# Patient Record
Sex: Female | Born: 1935
Health system: Southern US, Community
[De-identification: ages and names within clinical notes are randomized; demographics above are authoritative.]

## PROBLEM LIST (undated history)

## (undated) DIAGNOSIS — I2510 Atherosclerotic heart disease of native coronary artery without angina pectoris: Secondary | ICD-10-CM

## (undated) DIAGNOSIS — N8110 Cystocele, unspecified: Secondary | ICD-10-CM

## (undated) DIAGNOSIS — C4361 Malignant melanoma of right upper limb, including shoulder: Secondary | ICD-10-CM

## (undated) DIAGNOSIS — F419 Anxiety disorder, unspecified: Secondary | ICD-10-CM

## (undated) DIAGNOSIS — R05 Cough: Secondary | ICD-10-CM

## (undated) DIAGNOSIS — I2699 Other pulmonary embolism without acute cor pulmonale: Secondary | ICD-10-CM

## (undated) DIAGNOSIS — E669 Obesity, unspecified: Secondary | ICD-10-CM

## (undated) DIAGNOSIS — I442 Atrioventricular block, complete: Secondary | ICD-10-CM

## (undated) DIAGNOSIS — Z86711 Personal history of pulmonary embolism: Secondary | ICD-10-CM

## (undated) DIAGNOSIS — Z950 Presence of cardiac pacemaker: Secondary | ICD-10-CM

## (undated) DIAGNOSIS — I447 Left bundle-branch block, unspecified: Secondary | ICD-10-CM

## (undated) DIAGNOSIS — E559 Vitamin D deficiency, unspecified: Secondary | ICD-10-CM

## (undated) DIAGNOSIS — Z8619 Personal history of other infectious and parasitic diseases: Secondary | ICD-10-CM

## (undated) DIAGNOSIS — I10 Essential (primary) hypertension: Secondary | ICD-10-CM

## (undated) DIAGNOSIS — H8110 Benign paroxysmal vertigo, unspecified ear: Secondary | ICD-10-CM

## (undated) DIAGNOSIS — K5790 Diverticulosis of intestine, part unspecified, without perforation or abscess without bleeding: Secondary | ICD-10-CM

## (undated) DIAGNOSIS — E119 Type 2 diabetes mellitus without complications: Secondary | ICD-10-CM

## (undated) DIAGNOSIS — R059 Cough, unspecified: Secondary | ICD-10-CM

## (undated) DIAGNOSIS — R112 Nausea with vomiting, unspecified: Secondary | ICD-10-CM

## (undated) DIAGNOSIS — C801 Malignant (primary) neoplasm, unspecified: Secondary | ICD-10-CM

## (undated) DIAGNOSIS — Z9889 Other specified postprocedural states: Secondary | ICD-10-CM

## (undated) HISTORY — PX: TRANSTHORACIC ECHOCARDIOGRAM: SHX275

## (undated) HISTORY — DX: Malignant melanoma of right upper limb, including shoulder: C43.61

## (undated) HISTORY — DX: Benign paroxysmal vertigo, unspecified ear: H81.10

## (undated) HISTORY — PX: BREAST SURGERY: SHX581

## (undated) HISTORY — DX: Anxiety disorder, unspecified: F41.9

## (undated) HISTORY — DX: Other pulmonary embolism without acute cor pulmonale: I26.99

## (undated) HISTORY — DX: Vitamin D deficiency, unspecified: E55.9

## (undated) HISTORY — PX: CATARACT EXTRACTION W/ INTRAOCULAR LENS  IMPLANT, BILATERAL: SHX1307

## (undated) HISTORY — PX: CARDIOVASCULAR STRESS TEST: SHX262

## (undated) HISTORY — DX: Type 2 diabetes mellitus without complications: E11.9

## (undated) HISTORY — DX: Obesity, unspecified: E66.9

---

## 1965-11-21 HISTORY — PX: CHOLECYSTECTOMY: SHX55

## 1969-07-22 DIAGNOSIS — Z86711 Personal history of pulmonary embolism: Secondary | ICD-10-CM

## 1969-07-22 HISTORY — DX: Personal history of pulmonary embolism: Z86.711

## 1972-11-21 HISTORY — PX: VAGINAL HYSTERECTOMY: SUR661

## 1978-11-21 DIAGNOSIS — I2699 Other pulmonary embolism without acute cor pulmonale: Secondary | ICD-10-CM

## 1978-11-21 HISTORY — DX: Other pulmonary embolism without acute cor pulmonale: I26.99

## 2000-01-13 ENCOUNTER — Emergency Department (HOSPITAL_COMMUNITY): Admission: EM | Admit: 2000-01-13 | Discharge: 2000-01-13 | Payer: Self-pay | Admitting: Emergency Medicine

## 2000-04-24 ENCOUNTER — Other Ambulatory Visit: Admission: RE | Admit: 2000-04-24 | Discharge: 2000-04-24 | Payer: Self-pay | Admitting: Geriatric Medicine

## 2000-08-28 ENCOUNTER — Ambulatory Visit (HOSPITAL_COMMUNITY): Admission: RE | Admit: 2000-08-28 | Discharge: 2000-08-28 | Payer: Self-pay | Admitting: Geriatric Medicine

## 2000-08-28 ENCOUNTER — Encounter: Payer: Self-pay | Admitting: Geriatric Medicine

## 2001-03-21 ENCOUNTER — Encounter: Payer: Self-pay | Admitting: Geriatric Medicine

## 2001-03-21 ENCOUNTER — Encounter: Admission: RE | Admit: 2001-03-21 | Discharge: 2001-03-21 | Payer: Self-pay | Admitting: Geriatric Medicine

## 2003-07-01 ENCOUNTER — Encounter: Admission: RE | Admit: 2003-07-01 | Discharge: 2003-07-01 | Payer: Self-pay | Admitting: Geriatric Medicine

## 2003-07-01 ENCOUNTER — Encounter: Payer: Self-pay | Admitting: Geriatric Medicine

## 2004-08-12 ENCOUNTER — Encounter: Admission: RE | Admit: 2004-08-12 | Discharge: 2004-08-12 | Payer: Self-pay | Admitting: Geriatric Medicine

## 2004-10-11 ENCOUNTER — Ambulatory Visit (HOSPITAL_COMMUNITY): Admission: RE | Admit: 2004-10-11 | Discharge: 2004-10-11 | Payer: Self-pay | Admitting: Gastroenterology

## 2004-10-11 ENCOUNTER — Encounter (INDEPENDENT_AMBULATORY_CARE_PROVIDER_SITE_OTHER): Payer: Self-pay | Admitting: Specialist

## 2005-11-24 ENCOUNTER — Encounter: Admission: RE | Admit: 2005-11-24 | Discharge: 2005-11-24 | Payer: Self-pay | Admitting: Geriatric Medicine

## 2007-01-24 ENCOUNTER — Encounter: Admission: RE | Admit: 2007-01-24 | Discharge: 2007-01-24 | Payer: Self-pay | Admitting: Geriatric Medicine

## 2008-01-29 ENCOUNTER — Encounter: Admission: RE | Admit: 2008-01-29 | Discharge: 2008-01-29 | Payer: Self-pay | Admitting: Geriatric Medicine

## 2008-11-30 ENCOUNTER — Inpatient Hospital Stay (HOSPITAL_COMMUNITY): Admission: EM | Admit: 2008-11-30 | Discharge: 2008-12-01 | Payer: Self-pay | Admitting: Emergency Medicine

## 2008-11-30 ENCOUNTER — Ambulatory Visit: Payer: Self-pay | Admitting: Internal Medicine

## 2009-02-24 ENCOUNTER — Encounter: Admission: RE | Admit: 2009-02-24 | Discharge: 2009-02-24 | Payer: Self-pay | Admitting: Geriatric Medicine

## 2009-03-02 ENCOUNTER — Ambulatory Visit (HOSPITAL_COMMUNITY): Admission: RE | Admit: 2009-03-02 | Discharge: 2009-03-02 | Payer: Self-pay | Admitting: Gastroenterology

## 2009-11-28 ENCOUNTER — Emergency Department (HOSPITAL_COMMUNITY): Admission: EM | Admit: 2009-11-28 | Discharge: 2009-11-28 | Payer: Self-pay | Admitting: Emergency Medicine

## 2010-04-26 ENCOUNTER — Encounter: Admission: RE | Admit: 2010-04-26 | Discharge: 2010-04-26 | Payer: Self-pay | Admitting: Geriatric Medicine

## 2011-02-06 LAB — DIFFERENTIAL
Basophils Absolute: 0 10*3/uL (ref 0.0–0.1)
Basophils Relative: 1 % (ref 0–1)
Eosinophils Relative: 3 % (ref 0–5)
Neutro Abs: 3.6 10*3/uL (ref 1.7–7.7)

## 2011-02-06 LAB — BASIC METABOLIC PANEL
BUN: 20 mg/dL (ref 6–23)
CO2: 26 mEq/L (ref 19–32)
Chloride: 107 mEq/L (ref 96–112)
GFR calc Af Amer: >60 mL/min (ref >60)
GFR calc non Af Amer: >60 mL/min (ref >60)
Glucose, Bld: 164 mg/dL — ABNORMAL HIGH (ref 70–99)
Potassium: 4.1 mEq/L (ref 3.5–5.1)

## 2011-02-06 LAB — CBC
Hemoglobin: 13.1 g/dL (ref 12.0–15.0)
WBC: 5.8 10*3/uL (ref 4.0–10.5)

## 2011-03-07 LAB — DIFFERENTIAL
Basophils Absolute: 0 10*3/uL (ref 0.0–0.1)
Basophils Absolute: 0.1 10*3/uL (ref 0.0–0.1)
Eosinophils Absolute: 0 10*3/uL (ref 0.0–0.7)
Eosinophils Absolute: 0 10*3/uL (ref 0.0–0.7)
Eosinophils Absolute: 0.3 10*3/uL (ref 0.0–0.7)
Eosinophils Relative: 0 % (ref 0–5)
Eosinophils Relative: 3 % (ref 0–5)
Lymphocytes Relative: 12 % (ref 12–46)
Lymphocytes Relative: 13 % (ref 12–46)
Lymphocytes Relative: 20 % (ref 12–46)
Lymphs Abs: 1.4 10*3/uL (ref 0.7–4.0)
Monocytes Absolute: 0.5 10*3/uL (ref 0.1–1.0)
Monocytes Relative: 6 % (ref 3–12)
Neutrophils Relative %: 70 % (ref 43–77)
Neutrophils Relative %: 81 % — ABNORMAL HIGH (ref 43–77)
Neutrophils Relative %: 83 % — ABNORMAL HIGH (ref 43–77)

## 2011-03-07 LAB — COMPREHENSIVE METABOLIC PANEL
ALT: 52 U/L — ABNORMAL HIGH (ref 0–35)
Alkaline Phosphatase: 76 U/L (ref 39–117)
Alkaline Phosphatase: 89 U/L (ref 39–117)
BUN: 20 mg/dL (ref 6–23)
CO2: 28 mEq/L (ref 19–32)
Chloride: 101 mEq/L (ref 96–112)
Creatinine, Ser: 0.94 mg/dL (ref 0.4–1.2)
GFR calc Af Amer: >60 mL/min (ref >60)
GFR calc non Af Amer: 59 mL/min — ABNORMAL LOW (ref >60)
Glucose, Bld: 124 mg/dL — ABNORMAL HIGH (ref 70–99)
Potassium: 4.8 mEq/L (ref 3.5–5.1)
Potassium: 5 mEq/L (ref 3.5–5.1)
Sodium: 137 mEq/L (ref 135–145)
Total Protein: 7.4 g/dL (ref 6.0–8.3)

## 2011-03-07 LAB — CBC
HCT: 36.1 % (ref 36.0–46.0)
HCT: 36.2 % (ref 36.0–46.0)
HCT: 43.8 % (ref 36.0–46.0)
MCHC: 34.3 g/dL (ref 30.0–36.0)
MCV: 90.5 fL (ref 78.0–100.0)
Platelets: 252 10*3/uL (ref 150–400)
Platelets: 258 10*3/uL (ref 150–400)
RBC: 3.99 MIL/uL (ref 3.87–5.11)
RBC: 4.83 MIL/uL (ref 3.87–5.11)
RDW: 14.2 % (ref 11.5–15.5)
WBC: 16.5 10*3/uL — ABNORMAL HIGH (ref 4.0–10.5)
WBC: 9 10*3/uL (ref 4.0–10.5)

## 2011-03-07 LAB — URINALYSIS, ROUTINE W REFLEX MICROSCOPIC
Hgb urine dipstick: NEGATIVE
Specific Gravity, Urine: 1.01 (ref 1.005–1.030)
Urobilinogen, UA: 0.2 mg/dL (ref 0.0–1.0)
pH: 6.5 (ref 5.0–8.0)

## 2011-04-05 NOTE — Op Note (Signed)
Melissa Novak, Melissa Novak              ACCOUNT NO.:  [id number]   MEDICAL RECORD NO.:  [id number]          PATIENT TYPE:  AMB   LOCATION:  ENDO                         FACILITY:  [facility]   PHYSICIAN:  Petra Kuba, M.D.    DATE OF BIRTH:  [birth year]   DATE OF PROCEDURE:  DATE OF DISCHARGE:                               OPERATIVE REPORT   PROCEDURE:  Colonoscopy.   INDICATION:  The patient with recent diverticulitis, abnormal CAT scan,  change in bowel habits, history of colon polyps, almost due for colonic  screening.  Consent was signed after risks, benefits, methods, and  options thoroughly discussed multiple times in the past.   MEDICINES USED:  1. Morphine 4 mg.  2. Versed 10 mg.   PROCEDURE:  Rectal inspection is pertinent for external hemorrhoids,  small.  Digital exam was negative.  The pediatric video colonoscope was  inserted and fairly easily with abdominal pressure advanced around the  colon to the cecum.  On insertion, some sigmoid diverticula were seen,  but no other abnormalities.  Cecum was identified by the appendiceal  orifice and the ileocecal valve.  Prep was adequate.  There were some  liquid stools that required washing and suctioning.  The scope was  slowly withdrawn.  The cecum ascending, transverse, and majority of the  descending were all normal.  The few scattered diverticula were  confirmed in the sigmoid possibly in the distal descending, but no  polyps, tumors, masses, or signs of any residual inflammation was seen  as we slowly withdrew back to the rectum.  Once back into the rectum,  anorectal pull-through and retroflexion confirmed some small  hemorrhoids.  Scope was drained, readvanced towards the left side of the  colon.  Air was suctioned, scope removed.  The patient tolerated the  procedure well.  There was no obvious immediate complication.   ENDOSCOPIC DIAGNOSES:  1. Internal and external small hemorrhoids.  2. Few scattered primarily sigmoid  diverticula.  3. Otherwise, within normal limits to the cecum.   PLAN:  Followup p.r.n. or in 2 months to recheck symptoms and make sure  no further workup plans are needed and recheck colon screening in 5  years if doing well medically.           ______________________________  Petra Kuba, M.D.     MEM/MEDQ  D:  03/02/2009  T:  03/03/2009  Job:  [id number]   cc:   Hal T. Stoneking, M.D.

## 2011-04-05 NOTE — H&P (Signed)
Melissa Novak, Melissa Novak              ACCOUNT NO.:  [id number]   MEDICAL RECORD NO.:  [id number]          PATIENT TYPE:  EMS   LOCATION:  ED                           FACILITY:  [facility]   PHYSICIAN:  Donalynn Furlong, MD      DATE OF BIRTH:  1934-08-04   DATE OF ADMISSION:  11/29/2008  DATE OF DISCHARGE:                              HISTORY & PHYSICAL   PRIMARY CARE PHYSICIAN:  Hal T. Stoneking, M.D.   CHIEF COMPLAINTS:  Lower abdominal pain.   HISTORY OF PRESENT ILLNESS:  Melissa Novak is a 77 year old Caucasian  female who lives by herself.  She presented to Urgent Care at [facility]  clinic with lower abdominal pain starting yesterday and she mentioned  that this pain is in the lower abdomen which is dull with occasional  sharpness and over the bladder and lower abdomen, gets worse with  coughing.  She denied any urinary symptoms.  She feels warm but no  fever.  She denied any chills, respiratory symptoms, diarrhea,  constipation, urinary symptoms at this time.  She does have nausea  episodes without any vomiting, and the patient is already on doxycycline  (second round for her pneumonia).  She was started on Z-Pak and steroid  on December 28 and then 2 days ago she started on doxycycline for 5  days.  She mentioned that her pain is around 7/10 at the time of  presentation, now resolved with pain medication in the ER, but she is  still nauseous.  Appetite is reduced.  She does have a history of  diverticulosis but no history of a previous diverticulitis in the past.  She does have a colon polyp.  She mentioned she had a cholecystectomy  and total abdominal hysterectomy and bilateral salpingo-oophorectomy.   PAST MEDICAL HISTORY:  1. Total abdominal hysterectomy bilateral salpingo-oophorectomy.  2. Cholecystectomy.  3. Diverticulosis and colon polyp.  4. Hypertension.   SURGICAL HISTORY:  As above.   ALLERGIES:  KEFLEX CAUSES RASH, SULFA CAUSES RASH AND BLURRED VISION,  TALWIN.  SHE  MENTIONS THAT SHE HAS ALLERGY TO PENICILLIN, BUT SHE HAS  TOLERATED AMOXICILLIN AND AUGMENTIN IN THE PAST WHICH SUGGESTS THAT SHE  MAY NOT HAVE ALLERGY TO PENICILLIN, BUT SHE IS ALLERGIC TO  CEPHALOSPORIN.   FAMILY HISTORY:  Nothing contributory to the current problem.   SOCIAL HISTORY:  She lives by herself.  No recent alcohol, drug, tobacco  use.   REVIEW OF SYSTEMS:  Positive as per HPI, otherwise negative.   REVIEW OF SYSTEMS:  Done for 14 systems.   PHYSICAL EXAMINATION:  VITAL SIGNS:  Blood pressure 139/66, pulse 102,  temperature 97.6, pulse 16.  GENERAL:  Alert, oriented and not in acute distress, lying in bed.  Mild  nausea.  HEENT:  Head:  Normocephalic, nontraumatic.  Eyes:  Pupils equally  reactive to light and accommodation.  Extraocular muscles intact.  Oral  cavity and mucous membranes are moist.  No thrush noted.  NECK:  No thyromegaly, JVD or lymphadenopathy.  CARDIOVASCULAR:  S1, S2 regular.  No murmur.  LUNGS:  Clear to auscultation bilaterally.  No wheezing, no crackles.  ABDOMEN:  Minimal tenderness in the lower abdomen, otherwise, obese and  no distention.  No organomegaly.  Scar of previous surgery noted.  EXTREMITIES:  No clubbing, cyanosis or edema.  Pulses positive in all  four extremities.  SKIN:  No rash or bruits.  NEUROLOGIC:  Exam shows intact cranial nerves, muscular strength,  sensation and reflexes.   HOME MEDICATION:  Lasix, benazepril and amlodipine.   LABORATORY DATA:  CT of the pelvis and abdomen with contrast shows  sigmoid diverticulitis with no drainable abscess.  Urinalysis  unremarkable.  CBC shows WBC of 16.5 with a left shift, and no bands.  Comprehensive metabolic panel normal except glucose 124 and GFR 59.   ASSESSMENT AND PLAN:  1. Acute sigmoid diverticulitis without any abscess.  2. Hypertension controlled.  3. History of hysterectomy.  4. Cholecystectomy.  5. Leukocytosis.  6. Diverticulosis.  7. Colon polyp.   PLAN:   Will admit the patient on medicine bed with a diagnosis of  sigmoid diverticulitis.  The patient wants to be DNR.  DNR code was  discussed with her.  That is her wish.  Will put the order for DNR  today.  She will be n.p.o. except medicines, ice chips and water.  Will  check vitals, input/output every 8 hours.  Will check CBC with  differential, CMP in the morning.  Will start IV normal saline for  hydration.  Will start IV Ciprofloxacin and IV metronidazole for  antibiotic coverage for diverticulitis and given the history of  allergies.  We will avoid other medications.  Will start IV morphine,  Zofran and Phenergan p.r.n. for abdominal pain, nausea and vomiting,  respectively.  Will start SCDs on legs.  Will start IV Nexium for GI  prophylaxis.  Will continue benazepril and amlodipine at home dose for  the control of blood pressure. Further plan according to the workup  pending.  The patient may need further CT imaging at the resolution of  her current episode.      Donalynn Furlong, MD  Electronically Signed     TVP/MEDQ  D:  11/30/2008  T:  11/30/2008  Job:  [id number]   cc:   Hal T. Stoneking, M.D.  Fax: [phone]

## 2011-04-08 NOTE — Op Note (Signed)
Melissa Novak, Melissa Novak              ACCOUNT NO.:  [id number]   MEDICAL RECORD NO.:  [id number]          PATIENT TYPE:  AMB   LOCATION:  ENDO                         FACILITY:  [facility]   PHYSICIAN:  Petra Kuba, M.D.    DATE OF BIRTH:  Aug 06, 1934   DATE OF PROCEDURE:  10/11/2004  DATE OF DISCHARGE:                                 OPERATIVE REPORT   PROCEDURE:  Colonoscopy.   INDICATIONS:  Screening.   Consent was signed after the risks, benefits, methods and options were  thoroughly discussed in the office.   MEDICATIONS:  Morphine 4 mg, Versed 10.5 mg.   DESCRIPTION OF PROCEDURE:  Rectal inspection was pertinent for external  hemorrhoids.  Digital rectal exam was negative.  The video colonoscope was  inserted and advanced to the cecum.  This did require rolling her on her  back and various abdominal pressures.  Upon insertion, some occasional left-  sided diverticula were seen, no other abnormalities.  The cecum was  identified by the appendiceal orifice and the ileocecal valve.  The scope  was slowly withdrawn.  The prep was adequate, there was some liquid stool  removed with washing and suction.  On slow withdrawal through the colon, the  cecum, ascending, transverse, and descending were all normal except for an  occasional left-sided diverticulum mostly in the sigmoid. In the more distal  sigmoid a small polyp was seen, snared, electrocautery applied and it was  suctioned through the scope and collected in the trap.  One other tiny  distal sigmoid polyp was seen and was hot biopsied x1 and put in the same  container.  Anorectal pull through and retroflexion confirmed some small  hemorrhoids.  The scope then readvanced through the left side of the colon,  air was suctioned, and the scope removed.  The patient tolerated the  procedure well and there was no obvious or immediate complication.   ENDOSCOPIC DIAGNOSES:  1.  Internal and external hemorrhoids.  2.  Occasional  left-sided diverticula.  3.  Two tiny to small sigmoid polyps, one hot biopsied, one snared.  4.  Otherwise within normal limits to the cecum.   PLAN:  Await pathology and determine future colonic screening, see her back  p.r.n., otherwise return to the care of Dr. Pete Glatter, for the customary  healthcare maintenance to include yearly rectals and guaiacs.       MEM/MEDQ  D:  10/11/2004  T:  10/11/2004  Job:  [id number]   cc:   Hal T. Stoneking, M.D.  [address] [place], [place] [address]  Fax: [phone]

## 2011-04-26 ENCOUNTER — Other Ambulatory Visit: Payer: Self-pay | Admitting: Geriatric Medicine

## 2011-04-26 DIAGNOSIS — Z1231 Encounter for screening mammogram for malignant neoplasm of breast: Secondary | ICD-10-CM

## 2011-05-12 ENCOUNTER — Ambulatory Visit: Payer: Self-pay

## 2011-05-19 ENCOUNTER — Ambulatory Visit: Payer: Self-pay

## 2011-06-13 ENCOUNTER — Ambulatory Visit: Payer: Self-pay

## 2011-07-07 ENCOUNTER — Ambulatory Visit
Admission: RE | Admit: 2011-07-07 | Discharge: 2011-07-07 | Disposition: A | Discharge status: Home / Self Care | Payer: Medicare Other | Source: Ambulatory Visit | Attending: Geriatric Medicine | Admitting: Geriatric Medicine

## 2011-07-07 DIAGNOSIS — Z1231 Encounter for screening mammogram for malignant neoplasm of breast: Secondary | ICD-10-CM

## 2011-07-27 ENCOUNTER — Other Ambulatory Visit: Payer: Self-pay | Admitting: Dermatology

## 2011-11-28 DIAGNOSIS — N3941 Urge incontinence: Secondary | ICD-10-CM | POA: Diagnosis not present

## 2012-01-11 DIAGNOSIS — N3941 Urge incontinence: Secondary | ICD-10-CM | POA: Diagnosis not present

## 2012-01-17 DIAGNOSIS — N8111 Cystocele, midline: Secondary | ICD-10-CM | POA: Diagnosis not present

## 2012-01-17 DIAGNOSIS — N952 Postmenopausal atrophic vaginitis: Secondary | ICD-10-CM | POA: Diagnosis not present

## 2012-01-17 DIAGNOSIS — N3941 Urge incontinence: Secondary | ICD-10-CM | POA: Diagnosis not present

## 2012-01-19 ENCOUNTER — Other Ambulatory Visit: Payer: Self-pay | Admitting: Urology

## 2012-01-26 DIAGNOSIS — Z961 Presence of intraocular lens: Secondary | ICD-10-CM | POA: Diagnosis not present

## 2012-01-26 DIAGNOSIS — H04229 Epiphora due to insufficient drainage, unspecified lacrimal gland: Secondary | ICD-10-CM | POA: Diagnosis not present

## 2012-01-30 DIAGNOSIS — N952 Postmenopausal atrophic vaginitis: Secondary | ICD-10-CM | POA: Diagnosis not present

## 2012-02-09 DIAGNOSIS — I10 Essential (primary) hypertension: Secondary | ICD-10-CM | POA: Diagnosis not present

## 2012-02-09 DIAGNOSIS — R0789 Other chest pain: Secondary | ICD-10-CM | POA: Diagnosis not present

## 2012-02-09 DIAGNOSIS — I447 Left bundle-branch block, unspecified: Secondary | ICD-10-CM | POA: Diagnosis not present

## 2012-02-09 DIAGNOSIS — R32 Unspecified urinary incontinence: Secondary | ICD-10-CM | POA: Diagnosis not present

## 2012-02-09 DIAGNOSIS — R55 Syncope and collapse: Secondary | ICD-10-CM | POA: Diagnosis not present

## 2012-02-09 DIAGNOSIS — Z79899 Other long term (current) drug therapy: Secondary | ICD-10-CM | POA: Diagnosis not present

## 2012-02-09 DIAGNOSIS — Z01810 Encounter for preprocedural cardiovascular examination: Secondary | ICD-10-CM | POA: Diagnosis not present

## 2012-02-14 ENCOUNTER — Encounter (HOSPITAL_BASED_OUTPATIENT_CLINIC_OR_DEPARTMENT_OTHER): Payer: Self-pay | Admitting: *Deleted

## 2012-02-14 DIAGNOSIS — R32 Unspecified urinary incontinence: Secondary | ICD-10-CM | POA: Diagnosis not present

## 2012-02-14 DIAGNOSIS — Z01810 Encounter for preprocedural cardiovascular examination: Secondary | ICD-10-CM | POA: Diagnosis not present

## 2012-02-14 DIAGNOSIS — R0789 Other chest pain: Secondary | ICD-10-CM | POA: Diagnosis not present

## 2012-02-14 DIAGNOSIS — I447 Left bundle-branch block, unspecified: Secondary | ICD-10-CM | POA: Diagnosis not present

## 2012-02-14 DIAGNOSIS — R55 Syncope and collapse: Secondary | ICD-10-CM | POA: Diagnosis not present

## 2012-02-15 ENCOUNTER — Encounter (HOSPITAL_BASED_OUTPATIENT_CLINIC_OR_DEPARTMENT_OTHER): Payer: Self-pay | Admitting: *Deleted

## 2012-02-15 NOTE — Progress Notes (Signed)
NPO AFTER MN. ARRIVES AT 0700. NEEDS ISTAT. CURRENT EKG , NOTE, STRESS TEST TO BE FAXED FROM DR Caro Hight. WILL TAKE NORVASC AM OF SURG. W/ SIP OF WATER. REVIEWED [facility] GUIDELINES, WILL BRING MED.

## 2012-02-20 ENCOUNTER — Encounter (HOSPITAL_BASED_OUTPATIENT_CLINIC_OR_DEPARTMENT_OTHER): Payer: Self-pay | Admitting: *Deleted

## 2012-02-20 DIAGNOSIS — Z950 Presence of cardiac pacemaker: Secondary | ICD-10-CM

## 2012-02-20 HISTORY — DX: Presence of cardiac pacemaker: Z95.0

## 2012-02-20 HISTORY — PX: INSERT / REPLACE / REMOVE PACEMAKER: SUR710

## 2012-02-22 DIAGNOSIS — N3941 Urge incontinence: Secondary | ICD-10-CM | POA: Diagnosis not present

## 2012-02-23 DIAGNOSIS — R55 Syncope and collapse: Secondary | ICD-10-CM | POA: Diagnosis not present

## 2012-02-23 DIAGNOSIS — Z01810 Encounter for preprocedural cardiovascular examination: Secondary | ICD-10-CM | POA: Diagnosis not present

## 2012-02-23 DIAGNOSIS — I447 Left bundle-branch block, unspecified: Secondary | ICD-10-CM | POA: Diagnosis not present

## 2012-02-23 DIAGNOSIS — R32 Unspecified urinary incontinence: Secondary | ICD-10-CM | POA: Diagnosis not present

## 2012-02-23 DIAGNOSIS — R0789 Other chest pain: Secondary | ICD-10-CM | POA: Diagnosis not present

## 2012-02-24 ENCOUNTER — Ambulatory Visit (HOSPITAL_BASED_OUTPATIENT_CLINIC_OR_DEPARTMENT_OTHER)
Admission: RE | Admit: 2012-02-24 | Discharge: 2012-02-29 | Disposition: A | Discharge status: Home / Self Care | Payer: Medicare Other | Source: Ambulatory Visit | Attending: Urology | Admitting: Urology

## 2012-02-24 ENCOUNTER — Encounter (HOSPITAL_COMMUNITY)
Admission: RE | Disposition: A | Discharge status: Home / Self Care | Payer: Self-pay | Source: Ambulatory Visit | Attending: Urology

## 2012-02-24 ENCOUNTER — Ambulatory Visit (HOSPITAL_BASED_OUTPATIENT_CLINIC_OR_DEPARTMENT_OTHER): Payer: Medicare Other | Admitting: Anesthesiology

## 2012-02-24 ENCOUNTER — Encounter (HOSPITAL_BASED_OUTPATIENT_CLINIC_OR_DEPARTMENT_OTHER): Payer: Self-pay | Admitting: Anesthesiology

## 2012-02-24 ENCOUNTER — Encounter (HOSPITAL_BASED_OUTPATIENT_CLINIC_OR_DEPARTMENT_OTHER): Payer: Self-pay | Admitting: *Deleted

## 2012-02-24 DIAGNOSIS — N8111 Cystocele, midline: Secondary | ICD-10-CM | POA: Diagnosis not present

## 2012-02-24 DIAGNOSIS — I447 Left bundle-branch block, unspecified: Secondary | ICD-10-CM | POA: Insufficient documentation

## 2012-02-24 DIAGNOSIS — N8189 Other female genital prolapse: Secondary | ICD-10-CM | POA: Diagnosis not present

## 2012-02-24 DIAGNOSIS — R001 Bradycardia, unspecified: Secondary | ICD-10-CM | POA: Diagnosis present

## 2012-02-24 DIAGNOSIS — I10 Essential (primary) hypertension: Secondary | ICD-10-CM | POA: Insufficient documentation

## 2012-02-24 DIAGNOSIS — N3941 Urge incontinence: Secondary | ICD-10-CM | POA: Insufficient documentation

## 2012-02-24 DIAGNOSIS — I498 Other specified cardiac arrhythmias: Secondary | ICD-10-CM | POA: Insufficient documentation

## 2012-02-24 DIAGNOSIS — Z950 Presence of cardiac pacemaker: Secondary | ICD-10-CM | POA: Diagnosis present

## 2012-02-24 DIAGNOSIS — Z79899 Other long term (current) drug therapy: Secondary | ICD-10-CM | POA: Diagnosis not present

## 2012-02-24 DIAGNOSIS — I495 Sick sinus syndrome: Secondary | ICD-10-CM | POA: Diagnosis not present

## 2012-02-24 DIAGNOSIS — N816 Rectocele: Secondary | ICD-10-CM | POA: Insufficient documentation

## 2012-02-24 DIAGNOSIS — Z7982 Long term (current) use of aspirin: Secondary | ICD-10-CM | POA: Insufficient documentation

## 2012-02-24 DIAGNOSIS — I459 Conduction disorder, unspecified: Secondary | ICD-10-CM | POA: Diagnosis not present

## 2012-02-24 DIAGNOSIS — R55 Syncope and collapse: Secondary | ICD-10-CM | POA: Diagnosis not present

## 2012-02-24 DIAGNOSIS — Z86711 Personal history of pulmonary embolism: Secondary | ICD-10-CM | POA: Insufficient documentation

## 2012-02-24 DIAGNOSIS — N814 Uterovaginal prolapse, unspecified: Secondary | ICD-10-CM | POA: Diagnosis not present

## 2012-02-24 DIAGNOSIS — R42 Dizziness and giddiness: Secondary | ICD-10-CM | POA: Diagnosis not present

## 2012-02-24 HISTORY — DX: Atherosclerotic heart disease of native coronary artery without angina pectoris: I25.10

## 2012-02-24 HISTORY — DX: Essential (primary) hypertension: I10

## 2012-02-24 HISTORY — DX: Nausea with vomiting, unspecified: R11.2

## 2012-02-24 HISTORY — DX: Personal history of pulmonary embolism: Z86.711

## 2012-02-24 HISTORY — DX: Cystocele, unspecified: N81.10

## 2012-02-24 HISTORY — DX: Diverticulosis of intestine, part unspecified, without perforation or abscess without bleeding: K57.90

## 2012-02-24 HISTORY — PX: PUBOVAGINAL SLING: SHX1035

## 2012-02-24 HISTORY — DX: Other specified postprocedural states: Z98.890

## 2012-02-24 HISTORY — DX: Personal history of other infectious and parasitic diseases: Z86.19

## 2012-02-24 LAB — CBC
Hemoglobin: 14.2 g/dL (ref 12.0–15.0)
MCH: 32.2 pg (ref 26.0–34.0)
RBC: 4.41 MIL/uL (ref 3.87–5.11)
WBC: 11.7 10*3/uL — ABNORMAL HIGH (ref 4.0–10.5)

## 2012-02-24 LAB — POCT I-STAT, CHEM 8
BUN: 24 mg/dL — ABNORMAL HIGH (ref 6–23)
Calcium, Ion: 1.32 mmol/L (ref 1.12–1.32)
Chloride: 110 mEq/L (ref 96–112)
HCT: 43 % (ref 36.0–46.0)
Potassium: 4.1 mEq/L (ref 3.5–5.1)
Sodium: 144 mEq/L (ref 135–145)

## 2012-02-24 LAB — CREATININE, SERUM
Creatinine, Ser: 0.6 mg/dL (ref 0.50–1.10)
GFR calc Af Amer: >90 mL/min (ref >90)
GFR calc non Af Amer: 86 mL/min — ABNORMAL LOW (ref >90)

## 2012-02-24 SURGERY — CREATION, PUBOVAGINAL SLING
Anesthesia: General | Site: Vagina

## 2012-02-24 MED ORDER — INDIGOTINDISULFONATE SODIUM 8 MG/ML IJ SOLN
INTRAMUSCULAR | Status: DC | PRN
  Administered 2012-02-24: 5 mL via INTRAVENOUS

## 2012-02-24 MED ORDER — ZOLPIDEM TARTRATE 5 MG PO TABS
5.0000 mg | ORAL_TABLET | Freq: Every evening | ORAL | Status: DC | PRN
  Filled 2012-02-24 (×2): qty 1

## 2012-02-24 MED ORDER — ENOXAPARIN SODIUM 30 MG/0.3ML ~~LOC~~ SOLN
30.0000 mg | SUBCUTANEOUS | Status: DC
  Administered 2012-02-25: 30 mg via SUBCUTANEOUS
  Filled 2012-02-24 (×2): qty 0.3

## 2012-02-24 MED ORDER — CIPROFLOXACIN HCL 500 MG PO TABS: 500.0000 mg | ORAL_TABLET | Freq: Two times a day (BID) | ORAL | Status: AC

## 2012-02-24 MED ORDER — FENTANYL CITRATE 0.05 MG/ML IJ SOLN
INTRAMUSCULAR | Status: DC | PRN
  Administered 2012-02-24: 50 ug via INTRAVENOUS
  Administered 2012-02-24: 25 ug via INTRAVENOUS
  Administered 2012-02-24 (×2): 50 ug via INTRAVENOUS
  Administered 2012-02-24: 25 ug via INTRAVENOUS

## 2012-02-24 MED ORDER — BISACODYL 10 MG RE SUPP
10.0000 mg | Freq: Every day | RECTAL | Status: DC | PRN
  Administered 2012-02-28: 10 mg via RECTAL
  Filled 2012-02-24 (×2): qty 1

## 2012-02-24 MED ORDER — ONDANSETRON HCL 4 MG/2ML IJ SOLN
4.0000 mg | INTRAMUSCULAR | Status: DC | PRN
  Administered 2012-02-24 – 2012-02-28 (×6): 4 mg via INTRAVENOUS
  Filled 2012-02-24 (×5): qty 2

## 2012-02-24 MED ORDER — SODIUM CHLORIDE 0.9 % IJ SOLN
INTRAMUSCULAR | Status: DC | PRN
  Administered 2012-02-24: 50 mL via INTRAVENOUS

## 2012-02-24 MED ORDER — LACTATED RINGERS IV SOLN
INTRAVENOUS | Status: DC
  Administered 2012-02-24: 15:00:00 via INTRAVENOUS

## 2012-02-24 MED ORDER — MIDAZOLAM HCL 5 MG/5ML IJ SOLN
INTRAMUSCULAR | Status: DC | PRN
  Administered 2012-02-24: 1 mg via INTRAVENOUS

## 2012-02-24 MED ORDER — CIPROFLOXACIN HCL 250 MG PO TABS
250.0000 mg | ORAL_TABLET | Freq: Two times a day (BID) | ORAL | Status: DC
  Administered 2012-02-24 – 2012-02-28 (×9): 250 mg via ORAL
  Filled 2012-02-24 (×13): qty 1

## 2012-02-24 MED ORDER — FENTANYL CITRATE 0.05 MG/ML IJ SOLN
25.0000 ug | INTRAMUSCULAR | Status: DC | PRN
  Administered 2012-02-24: 25 ug via INTRAVENOUS

## 2012-02-24 MED ORDER — PSYLLIUM 95 % PO PACK
1.0000 | PACK | Freq: Every day | ORAL | Status: DC
  Administered 2012-02-25 – 2012-02-29 (×4): 1 via ORAL
  Filled 2012-02-24 (×5): qty 1

## 2012-02-24 MED ORDER — LIDOCAINE HCL (CARDIAC) 20 MG/ML IV SOLN
INTRAVENOUS | Status: DC | PRN
  Administered 2012-02-24: 50 mg via INTRAVENOUS

## 2012-02-24 MED ORDER — LACTATED RINGERS IV SOLN
INTRAVENOUS | Status: DC
  Administered 2012-02-24 (×2): via INTRAVENOUS

## 2012-02-24 MED ORDER — ONDANSETRON HCL 4 MG/2ML IJ SOLN
INTRAMUSCULAR | Status: DC | PRN
  Administered 2012-02-24: 4 mg via INTRAVENOUS

## 2012-02-24 MED ORDER — AMLODIPINE BESYLATE 5 MG PO TABS
5.0000 mg | ORAL_TABLET | Freq: Every morning | ORAL | Status: DC
  Administered 2012-02-25 – 2012-02-29 (×5): 5 mg via ORAL
  Filled 2012-02-24 (×5): qty 1

## 2012-02-24 MED ORDER — SODIUM CHLORIDE 0.9 % IR SOLN
Status: DC | PRN
  Administered 2012-02-24: 09:00:00

## 2012-02-24 MED ORDER — BELLADONNA ALKALOIDS-OPIUM 16.2-60 MG RE SUPP
RECTAL | Status: DC | PRN
  Administered 2012-02-24: 1 via RECTAL

## 2012-02-24 MED ORDER — DIPHENHYDRAMINE HCL 50 MG/ML IJ SOLN: 12.5000 mg | Freq: Four times a day (QID) | INTRAMUSCULAR | Status: DC | PRN

## 2012-02-24 MED ORDER — ENOXAPARIN SODIUM 40 MG/0.4ML ~~LOC~~ SOLN
40.0000 mg | Freq: Once | SUBCUTANEOUS | Status: AC
  Administered 2012-02-24: 40 mg via SUBCUTANEOUS

## 2012-02-24 MED ORDER — FLAX SEED OIL 1000 MG PO CAPS: ORAL_CAPSULE | Freq: Every morning | ORAL | Status: DC

## 2012-02-24 MED ORDER — CIPROFLOXACIN IN D5W 400 MG/200ML IV SOLN
400.0000 mg | INTRAVENOUS | Status: AC
  Administered 2012-02-24: 400 mg via INTRAVENOUS

## 2012-02-24 MED ORDER — VALACYCLOVIR HCL 500 MG PO TABS
1000.0000 mg | ORAL_TABLET | Freq: Every day | ORAL | Status: DC
  Administered 2012-02-25 – 2012-02-29 (×5): 1000 mg via ORAL
  Filled 2012-02-24 (×5): qty 2

## 2012-02-24 MED ORDER — SODIUM CHLORIDE 0.45 % IV SOLN
INTRAVENOUS | Status: DC
  Administered 2012-02-24 – 2012-02-26 (×4): via INTRAVENOUS

## 2012-02-24 MED ORDER — VITAMIN C 500 MG PO TABS
500.0000 mg | ORAL_TABLET | Freq: Every day | ORAL | Status: DC
  Administered 2012-02-25 – 2012-02-29 (×5): 500 mg via ORAL
  Filled 2012-02-24 (×5): qty 1

## 2012-02-24 MED ORDER — STERILE WATER FOR IRRIGATION IR SOLN
Status: DC | PRN
  Administered 2012-02-24: 3000 mL

## 2012-02-24 MED ORDER — ACETAMINOPHEN 10 MG/ML IV SOLN
1000.0000 mg | Freq: Four times a day (QID) | INTRAVENOUS | Status: DC
  Administered 2012-02-24: 1000 mg via INTRAVENOUS

## 2012-02-24 MED ORDER — PROMETHAZINE HCL 25 MG/ML IJ SOLN
6.2500 mg | INTRAMUSCULAR | Status: DC | PRN
  Administered 2012-02-24: 6.25 mg via INTRAVENOUS

## 2012-02-24 MED ORDER — HYDROCODONE-ACETAMINOPHEN 5-325 MG PO TABS
1.0000 | ORAL_TABLET | ORAL | Status: DC | PRN
Start: 2012-02-24 — End: 2012-02-26
  Administered 2012-02-24: 1 via ORAL
  Administered 2012-02-25: 2 via ORAL
  Administered 2012-02-25 – 2012-02-26 (×3): 1 via ORAL
  Filled 2012-02-24 (×4): qty 1
  Filled 2012-02-24: qty 2

## 2012-02-24 MED ORDER — HYDROCODONE-ACETAMINOPHEN 7.5-650 MG PO TABS: 1.0000 | ORAL_TABLET | Freq: Four times a day (QID) | ORAL | Status: AC | PRN

## 2012-02-24 MED ORDER — DEXAMETHASONE SODIUM PHOSPHATE 4 MG/ML IJ SOLN
INTRAMUSCULAR | Status: DC | PRN
  Administered 2012-02-24: 4 mg via INTRAVENOUS

## 2012-02-24 MED ORDER — PROPOFOL 10 MG/ML IV EMUL
INTRAVENOUS | Status: DC | PRN
  Administered 2012-02-24: 160 mg via INTRAVENOUS

## 2012-02-24 MED ORDER — DIPHENHYDRAMINE HCL 12.5 MG/5ML PO ELIX: 12.5000 mg | ORAL_SOLUTION | Freq: Four times a day (QID) | ORAL | Status: DC | PRN

## 2012-02-24 MED ORDER — HYDROMORPHONE HCL PF 1 MG/ML IJ SOLN
0.5000 mg | INTRAMUSCULAR | Status: DC | PRN
  Administered 2012-02-27: 1 mg via INTRAVENOUS
  Filled 2012-02-24 (×4): qty 1

## 2012-02-24 MED ORDER — FUROSEMIDE 20 MG PO TABS
20.0000 mg | ORAL_TABLET | Freq: Every day | ORAL | Status: DC
  Administered 2012-02-25 – 2012-02-29 (×5): 20 mg via ORAL
  Filled 2012-02-24 (×5): qty 1

## 2012-02-24 MED ORDER — BUPIVACAINE-EPINEPHRINE 0.5% -1:200000 IJ SOLN
INTRAMUSCULAR | Status: DC | PRN
  Administered 2012-02-24: 20 mL

## 2012-02-24 SURGICAL SUPPLY — 49 items
BAG URINE DRAINAGE (UROLOGICAL SUPPLIES) ×2 IMPLANT
BLADE SURG 10 STRL SS (BLADE) ×2 IMPLANT
BLADE SURG 15 STRL LF DISP TIS (BLADE) ×1 IMPLANT
BLADE SURG 15 STRL SS (BLADE) ×1
BLADE SURG ROTATE 9660 (MISCELLANEOUS) ×2 IMPLANT
BOOTIES KNEE HIGH SLOAN (MISCELLANEOUS) ×2 IMPLANT
CANISTER SUCTION 1200CC (MISCELLANEOUS) IMPLANT
CANISTER SUCTION 2500CC (MISCELLANEOUS) ×4 IMPLANT
CATH FOLEY 2WAY SLVR  5CC 16FR (CATHETERS) ×1
CATH FOLEY 2WAY SLVR 5CC 16FR (CATHETERS) ×1 IMPLANT
CLOTH BEACON ORANGE TIMEOUT ST (SAFETY) ×2 IMPLANT
COVER MAYO STAND STRL (DRAPES) ×2 IMPLANT
COVER TABLE BACK 60X90 (DRAPES) ×2 IMPLANT
DERMABOND ADVANCED (GAUZE/BANDAGES/DRESSINGS)
DERMABOND ADVANCED .7 DNX12 (GAUZE/BANDAGES/DRESSINGS) IMPLANT
DISSECTOR ROUND CHERRY 3/8 STR (MISCELLANEOUS) ×2 IMPLANT
DRAPE CAMERA CLOSED 9X96 (DRAPES) ×2 IMPLANT
DRAPE UNDERBUTTOCKS STRL (DRAPE) ×2 IMPLANT
FLOSEAL 10ML (HEMOSTASIS) IMPLANT
GAUZE SPONGE 4X4 16PLY XRAY LF (GAUZE/BANDAGES/DRESSINGS) ×2 IMPLANT
GLOVE BIO SURGEON STRL SZ7.5 (GLOVE) ×2 IMPLANT
GOWN PREVENTION PLUS LG XLONG (DISPOSABLE) ×2 IMPLANT
GOWN STRL REIN XL XLG (GOWN DISPOSABLE) ×2 IMPLANT
LIGHT GLOVE ×2 IMPLANT
NEEDLE HYPO 22GX1.5 SAFETY (NEEDLE) ×4 IMPLANT
PACKING VAGINAL (PACKING) ×2 IMPLANT
PENCIL BUTTON HOLSTER BLD 10FT (ELECTRODE) ×2 IMPLANT
PLUG CATH AND CAP STER (CATHETERS) ×2 IMPLANT
RETRACTOR LONRSTAR 16.6X16.6CM (MISCELLANEOUS) ×1 IMPLANT
RETRACTOR STAY HOOK 5MM (MISCELLANEOUS) ×2 IMPLANT
RETRACTOR STER APS 16.6X16.6CM (MISCELLANEOUS) ×2
SET IRRIG Y TYPE TUR BLADDER L (SET/KITS/TRAYS/PACK) ×2 IMPLANT
SHEET LAVH (DRAPES) ×2 IMPLANT
SLING SOLYX SYSTEM SIS BX (SLING) IMPLANT
SPONGE LAP 4X18 X RAY DECT (DISPOSABLE) ×4 IMPLANT
SUCTION FRAZIER TIP 10 FR DISP (SUCTIONS) ×2 IMPLANT
SUT ETHILON 2 0 PS N (SUTURE) IMPLANT
SUT MON AB 2-0 SH 27 (SUTURE)
SUT MON AB 2-0 SH27 (SUTURE) IMPLANT
SUT SILK 3 0 PS 1 (SUTURE) ×2 IMPLANT
SUT VIC AB 2-0 UR6 27 (SUTURE) ×16 IMPLANT
SYR BULB IRRIGATION 50ML (SYRINGE) ×2 IMPLANT
SYR CONTROL 10ML LL (SYRINGE) ×4 IMPLANT
SYRINGE 10CC LL (SYRINGE) ×2 IMPLANT
SYS PRF KIT VAG SUP UPHOLD (Sling) ×2 IMPLANT
TRAY DSU PREP LF (CUSTOM PROCEDURE TRAY) ×2 IMPLANT
TUBE CONNECTING 12X1/4 (SUCTIONS) ×4 IMPLANT
WATER STERILE IRR 500ML POUR (IV SOLUTION) ×4 IMPLANT
YANKAUER SUCT BULB TIP NO VENT (SUCTIONS) IMPLANT

## 2012-02-24 NOTE — Progress Notes (Signed)
OWER w/ telemetry requested via bedcontrol

## 2012-02-24 NOTE — Progress Notes (Signed)
Received call from Dr. Eldridge Dace, order received to admit pt to telemetry for OWER. Pt and daughter informed.

## 2012-02-24 NOTE — Progress Notes (Signed)
Ready for d/c to room. Dr. Eldridge Dace paged via office regarding destination ; telemetry vs RCC. Daughter aware.

## 2012-02-24 NOTE — Plan of Care (Signed)
Problem: Diagnosis - Type of Surgery Goal: General Surgical Patient Education (See Patient Education module for education specifics)  Outcome: Completed/Met Date Met:  02/24/12 Vaginal Sling

## 2012-02-24 NOTE — Progress Notes (Signed)
Spoke w Florentina Addison at [place] cardiology. Florentina Addison informed that RCC has no capability for telemetry., Dr. Patsi Sears reqwuested wre. If telemetry needed, pt will have to be admitted to [facility]. Katie to call back after talking w physician. Pt's daughter informed of situation.

## 2012-02-24 NOTE — Anesthesia Preprocedure Evaluation (Signed)
Anesthesia Evaluation  Patient identified by MRN, date of birth, ID band Patient awake    Reviewed: Allergy & Precautions, H&P , NPO status , Patient's Chart, lab work & pertinent test results  History of Anesthesia Complications (+) PONV  Airway Mallampati: II TM Distance: >3 FB Neck ROM: Full    Dental  (+) Teeth Intact and Dental Advisory Given   Pulmonary neg pulmonary ROS,  breath sounds clear to auscultation  Pulmonary exam normal       Cardiovascular hypertension, negative cardio ROS  + dysrhythmias Rhythm:Regular Rate:Normal  History of PE in remote past   Neuro/Psych Anxiety negative neurological ROS  negative psych ROS   GI/Hepatic negative GI ROS, Neg liver ROS,   Endo/Other  negative endocrine ROSMorbid obesity  Renal/GU negative Renal ROS  negative genitourinary   Musculoskeletal negative musculoskeletal ROS (+)   Abdominal   Peds  Hematology negative hematology ROS (+)   Anesthesia Other Findings   Reproductive/Obstetrics negative OB ROS                           Anesthesia Physical Anesthesia Plan  ASA: III  Anesthesia Plan: General   Post-op Pain Management:    Induction: Intravenous  Airway Management Planned: LMA  Additional Equipment:   Intra-op Plan:   Post-operative Plan:   Informed Consent: I have reviewed the patients History and Physical, chart, labs and discussed the procedure including the risks, benefits and alternatives for the proposed anesthesia with the patient or authorized representative who has indicated his/her understanding and acceptance.   Dental advisory given  Plan Discussed with: CRNA  Anesthesia Plan Comments:         Anesthesia Quick Evaluation

## 2012-02-24 NOTE — Progress Notes (Signed)
Dr. Patsi Sears called and given update regarding holter monitor,its removal this am, event recorded this am w/ pt c/o of dizzines prior to arrival. Will check w/ [facility] cardiology regarding telemetry vs [facility] w holter monitor.

## 2012-02-24 NOTE — H&P (Signed)
Chief Complaint  cc: Dr. Pete Glatter   Reason For Visit Pre-op   Active Problems Problems  1. Abdominal Pain 789.00 2. Acute Cystitis 595.0 3. Urge Incontinence Of Urine 788.31 4. Vaginal Wall Prolapse With Midline Cystocele 618.01  History of Present Illness          Mrs Helvey is a 78 yo female, originally referred by Dr. Pete Glatter for further evaluation of a cystocele. Her bladder "fell out" 2 months ago, and she "pushed it back".  She has frequency, dysuria, difficulty postponing urination and incontinence. Para 3-3-0. Tobacco: 15 yrs x 1/2 ppd.  No constipation. TAH x 40 yrs ago.( Dr. Laural Benes). She is not sexually active (husband died of CaP).      Exam showed anterior vaginal wall prolapse, with  bladder " falling out". She had atrophic vaginitis. She must push bladder back internally. She has frequency, dysuria, difficulty postponing urine, and incontinence. Para 3-3-0. No constipation. Hx of TAH 40 yrs ago. On Exam She had a cystocele, grade IV ( now with Stage IV anterior vault prolapse, with POPQ +4 Ap.) She also had urethral hypermobility and vaginal atrophy. Videourodynamics shows sensory urgency, wht 1st sensation at 54cc, and with small bladder capacity of 300cc. She did not leak with her cystocele reduced, consistant with her history. When her bladder is infected, she has urge and urge incontinence. She is able to void with low detrussor pressure.   Past Medical History Problems  1. History of  Hypertension 401.9 2. History of  Pulmonary Embolism V12.55 3. History of  Thrombophlebitis Of Deep Vessels Of The Lower Extremity V12.51  Surgical History Problems  1. History of  Cholecystectomy 2. History of  Hysterectomy V45.77  Current Meds 1. AmLODIPine Besylate 5 MG Oral Tablet; Therapy: (Recorded:07Dec2012) to 2. Aspirin 325 MG Oral Tablet; Therapy: (Recorded:21Dec2012) to 3. Calcium 600 + D TABS; Therapy: (Recorded:21Dec2012) to 4. Co Q 10 CAPS; Therapy:  (Recorded:21Dec2012) to 5. Fish Oil CAPS; Therapy: (Recorded:21Dec2012) to 6. Flax Seed Oil CAPS; Therapy: (Recorded:21Dec2012) to 7. Furosemide 20 MG Oral Tablet; Therapy: (Recorded:07Dec2012) to 8. Garlic CAPS; Therapy: (Recorded:21Dec2012) to 9. Glucosamine CAPS; Therapy: (Recorded:21Dec2012) to 10. Hydrocodone-Acetaminophen 5-325 MG Oral Tablet; TAKE 1 TABLET EVERY 4 TO 6 HOURS AS   NEEDED; Therapy: 21Dec2012 to (Last Rx:21Dec2012) 11. Magnesium CAPS; Therapy: (Recorded:21Dec2012) to 12. ValACYclovir HCl 1 GM Oral Tablet; Therapy: 31May2012 to 13. Vitamin D CAPS; Therapy: (Recorded:21Dec2012) to  Allergies Medication  1. Sulfa Drugs 2. Codeine Derivatives 3. Demerol TABS 4. Dramamine TABS 5. Keflex TABS 6. Penicillins 7. TraMADol HCl TABS  Family History Problems  1. Family history of  Family Health Status Number Of Children 1 son, 2 daughters 2. Family history of  Father Deceased At Age 43 leukemia 3. Paternal history of  Leukemia V16.6 4. Maternal history of  Lung Cancer V16.1 5. Family history of  Mother Deceased At Age 50 lung cancer  Social History Problems  1. Alcohol Use Rare 2. Caffeine Use 2-3 per day 3. Former Smoker V15.82 4. Marital History - Widowed  Review of Systems Constitutional, skin, eye, otolaryngeal, hematologic/lymphatic, cardiovascular, pulmonary, endocrine, musculoskeletal, gastrointestinal, neurological and psychiatric system(s) were reviewed and pertinent findings if present are noted.  Genitourinary: urinary frequency, urinary urgency, dysuria and incontinence and difficulty postponing urination.    Vitals Vital Signs [Data Includes: Last 1 Day]  26Feb2013 09:18AM  Blood Pressure: 137 / 79 Temperature: 97.3 F Heart Rate: 83  Physical Exam Genitourinary: Examination of the external genitalia shows vulvar atrophy, but  normal female external genitalia and no lesions. The urethra is normal in appearance and not tender. There is no  urethral mass. Urethral hypermobility is present. Vaginal exam demonstrates no abnormalities. A cystocele is present with a midline defect (grade 4 /4). No enterocele is identified. No rectocele is identified. The adnexa are palpably normal. The bladder is non tender and not distended. The anus is normal on inspection. The perineum is normal on inspection.    Assessment Assessed  1. Urge Incontinence Of Urine 788.31 2. Vaginal Wall Prolapse With Midline Cystocele 618.01 3. Postmenopausal Atrophic Vaginitis 627.3  We began our discussion of urinary continence with the anthropologic consideration of four-legged animals, and the existence of a pelvic floor muscles to control tail movements. With the evolution of upright animals, the same muscular platform must now perform the task of pelvic support of the upright body, with the emphasis on the pelvic floor shifting from muscular coordination to support, including recruitment of the fascia attachments, ligaments and muscle bundles. The muscle itself is always contracted, in order to provide support. It consists of "slow twitch "and "fast twitch" fibers,  for both chronic support and the ability to respond quickly to voiding and deficatory stimuli. Extensions of this muscle, as well  as  ligaments, wrap around the urethra, in order to provide support, and compress the urethra against the pubis. When the muscle relaxes, voiding can then take place.  The urethra itself is a star-shaped organ, with the mucosal limbs lined with estrogen-sensitive mucus producing cells. The mucous help provide a "seal" mechanism for urinary continence. The support structures for these cells are also estrogen sensitive.  Furthermore, urinary incontinence is controlled with both voluntary urethral sphincter muscle, as well as involuntary sphincter muscle.  Urinary incontinence may be considered a problem of the urinary bladder storage, or evacuation; or of the bladder outlet-with  problems of opening and closing. Pelvic organ prolapse consists of weakness of the urethral wall, possibly including the anterior vaginal wall (bladder); the posterior vaginal wall (rectocele) or the vaginal apex, including the cervix or apical scar. Evaluation consists of the history, physical examination, laboratory, and possibly videourodynamics.   For Mrs Bartnick, prolapse consists of anterior vaginal wall only. She has no incontnence clinically, and none by urodynamics, even with reduction of the prolapse. We have discussed alternative treatments for pelvic floor repair: including native  tisssue repair with Tresa Endo plication-with 50% failure rate. However "failure" may eventually be redefined as a clinical asymptomatic pelvic floor vs anatomic staging.; 2) biologic repair with 20% relapse. Would attach biologic tissue to her sacrospinus ligaments; 3) pelvic floor repair with mesh augmentation using [employer] "mesh light", a smaller peice of permanent material, with which I have had  success, but without long-term follow-up. She could also have laparascopic or robotic culposuspension.  She has decided to proceed with UPHOLD LIGHT sacrospinus fixation anterior vault repair. She doesn't need co-incidental sling, although she understands that she may need sling in the future-and this could be done as an outpatient ( Solyx sling).    We have discussed specific complications of the use of vaginal mesh, including extrusion ( 3%) and erosion into the bladder, dyspaurnea ( not sexually active), and chronic pain. Natural orifice surgery has the advantage of no abdominal incision, with absorbable sutures. I anticipate that she will be able to drive within 1 week, and resume [employer] floor exercise in 3-6 weeks. She will be able to use the machines in 3-6 weeks. She needs pro-op vaninal  hormone. Note her hx of phlebitis and PE, and she will stay on her ASA for now, then use SCDs and Lovenox coverage. 1. Use morphine  and versed ( allergy to demerol) 2. Note hs of shingles in R eye. On valtrex ( 1 grmam/day)  3. Note  hx of phlebitis: will need SCDs and Lovenox coverage 4.,  Begin pre op vaginal hormone Rx with [employer].   Plan Urge Incontinence Of Urine (788.31), Vaginal Wall Prolapse With Midline Cystocele (618.01), Postmenopausal Atrophic Vaginitis (627.3)    Estrogen vaginally. Continue ASA each day ( hx of phlebitis and PE 50 yrs ago). Will need SCD and Lovenox because of "high risk" for phlebitis.  2. Proceed with pelvic floor repair.  3. Allergy to Demerol ( ok for MO4 and Versed).   Signatures

## 2012-02-24 NOTE — Progress Notes (Signed)
Received call from Katie at [facility] cardiology. No Physician in office at [facility] time.  Dr. Eldridge Dace paged via office.

## 2012-02-24 NOTE — Transfer of Care (Signed)
Immediate Anesthesia Transfer of Care Note  Patient: Melissa Novak  Procedure(s) Performed: Procedure(s) (LRB): PUBO-VAGINAL SLING (N/A)  Patient Location: PACU  Anesthesia Type: General  Level of Consciousness: awake, oriented, sedated and patient cooperative  Airway & Oxygen Therapy: Patient Spontanous Breathing and Patient connected to face mask oxygen  Post-op Assessment: Report given to PACU RN and Post -op Vital signs reviewed and stable  Post vital signs: Reviewed and stable  Complications: No apparent anesthesia complications

## 2012-02-24 NOTE — Progress Notes (Addendum)
Received call from [facility] urology regarding ? consult for pt.  Dr. Lattie Haw at bedside and unaware of consult. Preparing pt for transfer to [facility]., information received That pt had holter monitor that was removed prior to OR this am.Dr. Eldridge Dace to come and see today due to event logged on monitor.

## 2012-02-24 NOTE — Anesthesia Procedure Notes (Signed)
Procedure Name: LMA Insertion Date/Time: 02/24/2012 8:46 AM Performed by: Renella Cunas D Pre-anesthesia Checklist: Patient identified, Emergency Drugs available, Suction available and Patient being monitored Patient Re-evaluated:Patient Re-evaluated prior to inductionOxygen Delivery Method: Circle System Utilized Preoxygenation: Pre-oxygenation with 100% oxygen Intubation Type: IV induction Ventilation: Mask ventilation without difficulty LMA: LMA inserted LMA Size: 4.0 Number of attempts: 1 Airway Equipment and Method: bite block Placement Confirmation: positive ETCO2 Tube secured with: Tape Dental Injury: Teeth and Oropharynx as per pre-operative assessment

## 2012-02-24 NOTE — Consult Note (Signed)
Admit date: 02/24/2012 Referring Physician  Dr. Cassell Smiles  Primary Cardiologist  Dr. Anne Fu Reason for Consultation  bradycardia  HPI: Melissa Novak who has been seen by Dr. Anne Fu due to syncope.  He placed a [employer] monitor on her and early this morning, she experienced a 4 second pause.  She felt some mild dizziness without pause but did not pass out or fainting she was close to passing out.  The monitoring company called her to check on her due to the pause.  She's had similar episodes like this in the past.  She will get dizzy, very weak and lightheaded.  She has passed out only once.  She denies any chest pain or shortness of breath.  She underwent bladder surgery earlier today.  Her operative course was uneventful.  She has been maintained on telemetry postoperatively and this has been stable as well.     PMH:   Past Medical History  Diagnosis Date  . Diverticulosis   . Hypertension   . Prolapse of anterior vaginal wall   . History of shingles 2004 --  EYES  , NO RESIDUAL PAIN  . History of pulmonary embolism 1970'S  . Coronary artery disease CARDIOLOGIST- DR Caro Hight-  LAST VISIT THIS WEEK--  WILL REQUEST NOTE, STRESS TEST     PT DENIES S & S  . PONV (postoperative nausea and vomiting)      PSH:   Past Surgical History  Procedure Date  . Cholecystectomy 1967  . Vaginal hysterectomy 1974    STILL HAS OVARIES  . Cataract extraction w/ intraocular lens  implant, bilateral   . Transthoracic echocardiogram 02-15-2012  DR SKAINS    NORMAL EF 60-65%/ MILD AORTIC SCLEROSIS, NO STENOSIS, GRADE I DIASTOLIC DYSFUNCTION  . Cardiovascular stress test 02-14-2012  DR SKAINS    LOW RISK NUCLEAR TEST/ MILD FIXED DEFECT ALONG THE DISTAL ANTERIOR SEPTAL WALL CONSTISTENT WITH LBBB/ NO ISCHEMIA     Allergies:  Sulfa antibiotics; Benadryl; Bonine; Demerol; Dramamine; Keflex; Penicillins; and Talwin Prior to Admit Meds:   Prescriptions prior to admission  Medication Sig Dispense Refill  .  amLODipine (NORVASC) 5 MG tablet Take 5 mg by mouth every morning.      . Calcium Carbonate-Vitamin D (CALCIUM + D PO) Take by mouth daily.      . cholecalciferol (VITAMIN D) 1000 UNITS tablet Take 1,000 Units by mouth daily.      . Coenzyme Q10 (COQ-10 PO) Take by mouth daily.      . Estradiol Acetate [facility]) Place vaginally.      . Flaxseed, Linseed, (FLAX SEED OIL PO) Take by mouth daily.      . furosemide (LASIX) 20 MG tablet Take 20 mg by mouth daily.      Marland Kitchen GARLIC PO Take by mouth daily.      . Misc Natural Products (OSTEO BI-FLEX JOINT SHIELD PO) Take by mouth daily.      . Multiple Vitamins-Minerals (ZINC PO) Take by mouth daily.      . Omega-3 Fatty Acids (FISH OIL PO) Take by mouth daily.      . psyllium (METAMUCIL) 58.6 % powder Take 1 packet by mouth daily.      Marland Kitchen Specialty Vitamins Products (MAGNESIUM, AMINO ACID CHELATE,) 133 MG tablet Take 1 tablet by mouth daily.      . valACYclovir (VALTREX) 1000 MG tablet Take 1,000 mg by mouth daily.      . vitamin C (ASCORBIC ACID) 500 MG tablet Take 500  mg by mouth daily.       Fam HX:   History reviewed. No pertinent family history. Social HX:    History   Social History  . Marital Status: Widowed    Spouse Name: N/A    Number of Children: N/A  . Years of Education: N/A   Occupational History  . Not on file.   Social History Main Topics  . Smoking status: Never Smoker   . Smokeless tobacco: Never Used  . Alcohol Use: Yes     RARE  . Drug Use: No  . Sexually Active:    Other Topics Concern  . Not on file   Social History Narrative  . No narrative on file     ROS:  All 11 ROS were addressed and are negative except what is stated in the HPI  Physical Exam: Blood pressure 105/50, pulse 65, temperature 97 F (36.1 C), temperature source Oral, resp. rate 15, height 5\' 3"  (1.6 m), weight 92.534 kg (204 lb), SpO2 99.00%.   General: Well developed, well nourished, in no acute distress Head:  Normal cephalic and  atramatic  Lungs:   Clear bilaterally to auscultation and percussion. Heart:   HRRR S1 S2 Abdomen:abdomen soft and non-tender Msk:   Normal strength and tone for age. Extremities:   No clubbing, cyanosis or edema.   Neuro: Alert and oriented X 3. Psych:  Good affect, responds appropriately    Labs:   Lab Results  Component Value Date   WBC 5.8 11/28/2009   HGB 14.6 02/24/2012   HCT 43.0 02/24/2012   MCV 90.2 11/28/2009   PLT 228 11/28/2009    Lab 02/24/12 0812  NA 144  K 4.1  CL 110  CO2 --  BUN 24*  CREATININE 0.70  CALCIUM --  PROT --  BILITOT --  ALKPHOS --  ALT --  AST --  GLUCOSE 125*   No results found for this basename: PTT   No results found for this basename: INR, PROTIME   No results found for this basename: CKTOTAL, CKMB, CKMBINDEX, TROPONINI     No results found for this basename: CHOL   No results found for this basename: HDL   No results found for this basename: LDLCALC   No results found for this basename: TRIG   No results found for this basename: CHOLHDL   No results found for this basename: LDLDIRECT       Telemetry currently shows normal sinus rhythm with heart rate of 80.  ASSESSMENT: Sick sinus syndrome with symptomatic bradycardia.  PLAN:  I discussed the case with Dr. Graciela Husbands.  It is likely that she will need a pacemaker at some point.  Ideally, she would heal from her bladder surgery first prior to having a pacemaker placed.  Would recommend that she be watched overnight on telemetry.  She was to be watched overnight in the outpatient unit, but she'll be moved to a telemetry floor instead.  Avoid rate slowing drugs.  Dr. Graciela Husbands will followup on her telemetry tomorrow morning.  Timing of pacemaker placement will have to be determined.  Once she leaves the hospital, she should place her monitor back on.  Currently, she is hemodynamically stable.  Corky Crafts., MD  02/24/2012  3:45 PM

## 2012-02-24 NOTE — Progress Notes (Signed)
Pt ready to transfer to 1419.at [facility].  RN not available at this time.Marland Kitchen

## 2012-02-24 NOTE — Progress Notes (Signed)
Dr. Eldridge Dace in to assess. Dr. Patsi Sears called and given rm number and update

## 2012-02-24 NOTE — Progress Notes (Signed)
  Subjective: Patient reports  POST-OP Note: no pain. Pt feeling well. Confused why she needed cardiac evaluation.   Objective: Vital signs in last 24 hours: Temp:  [96.9 F (36.1 C)-97.4 F (36.3 C)] 97.4 F (36.3 C) (04/05 1605) Pulse Rate:  [65-83] 80  (04/05 1605) Resp:  [14-20] 14  (04/05 1605) BP: (105-141)/(50-77) 136/76 mmHg (04/05 1605) SpO2:  [93 %-100 %] 97 % (04/05 1605)A  Intake/Output from previous day:   Intake/Output this shift: Total I/O In: 1800 [I.V.:1800] Out: 600 [Urine:600]  Past Medical History  Diagnosis Date  . Diverticulosis   . Hypertension   . Prolapse of anterior vaginal wall   . History of shingles 2004 --  EYES  , NO RESIDUAL PAIN  . History of pulmonary embolism 1970'S  . Coronary artery disease CARDIOLOGIST- DR Caro Hight-  LAST VISIT THIS WEEK--  WILL REQUEST NOTE, STRESS TEST     PT DENIES S & S  . PONV (postoperative nausea and vomiting)     Physical Exam:  General wdwnwf in NAD. Lungs - Normal respiratory effort, chest expands symmetrically.  Abdomen - Soft, non-tender & non-distended.  Lab Results:  Basename 02/24/12 0812  WBC --  HGB 14.6  HCT 43.0   BMET  Basename 02/24/12 0812  NA 144  K 4.1  CL 110  CO2 --  GLUCOSE 125*  BUN 24*  CREATININE 0.70  CALCIUM --   No results found for this basename: LABURIN:1 in the last 72 hours No results found for this or any previous visit.  Studies/Results: @RISRSLT24 @  Assessment/Plan: No problems tonight. We have discussed her Cardiology evaluation. She had 4 sec pause on her Holter monitor, and cardiology required her to be admitted for observation. She may need future pacemaker.   Emarion Toral I 02/24/2012, 5:57 PM

## 2012-02-24 NOTE — Interval H&P Note (Signed)
History and Physical Interval Note:  02/24/2012 8:30 AM  Melissa Novak  has presented today for surgery, with the diagnosis of ANTERIOR VAULT PROLAPSE  The various methods of treatment have been discussed with the patient and family. After consideration of risks, benefits and other options for treatment, the patient has consented to  Procedure(s) (LRB): PUBO-VAGINAL SLING (N/A) as a surgical intervention .  The patients' history has been reviewed, patient examined, no change in status, stable for surgery.  I have reviewed the patients' chart and labs.  Questions were answered to the patient's satisfaction.     [name] I

## 2012-02-24 NOTE — Op Note (Signed)
Pre-operative diagnosis : Anterior pelvic floor prolapse  Postoperative diagnosis: Same  Operation: [employer] sacrospinous anterior vault repair  Surgeon:  S. Patsi Sears, MD  First assistant: None  Anesthesia: General LMA  Preparation: After appropriate premedication, the patient was brought to the operating room, placed in the upper table in the dorsal supine position where general LMA anesthesia was introduced. She was then replaced in the dorsal lithotomy position with pubis was prepped with a Betadine solution and draped in usual fashion. The arm band was rechecked.  Review history: The patient is known to have a large, grade 4 cystocele, thought to be stage III anterior vault prolapse. She has a grade 1, stage I rectocele. She has no stress incontinence, either by office examination, oral video urodynamics. She does, however, have a small capacity, unstable bladder. She has been treated preoperatively with vaginal Estring, and this is removed at the time of surgery, and will be replaced in 3 weeks. She will probably need anticholinergic therapy as well.  Statement of  Likelihood of Success: Excellent. TIME-OUT observed.:  Procedure: The [place] retractors placed. The patient is a large anterior vaginal vault prolapse, and also a small rectocele, which is high. The urethra appeared normal. Estring is placed in a zip lock bag.  The mid urethra was marked, bladder neck was marked, and the incision in the anterior vaginal wall was marked with a marking pen. 50 cc of Marcaine 25 with epinephrine 1-200,000 is injected in the proposed incision line for both hemostasis, as well as Hydro dissection.  Incision is then accomplished horizontally across the vagina, subcutaneous tissue dissected into the pelvic floor. Finger dissection is then accomplished, and the ischial spines were identified and dissected. A Kelly plication was accomplished with 2-0 Vicryl suture. Following  this, using Cappio device, sutures are placed in the sacrospinous ligament bilaterally, one finger breath medial to the ischial spines. The light mesh is then placed, and tacked in place proximalward, with 3 separate 2-0 Vicryl tacking sutures, and distalward with 3 separate 2-0 Vicryl tacking sutures. Care is taken to avoid bunching of the mesh. Antibiotic irrigation is used. The wings of the mesh are then brought through the sacrospinous ligament and through the incision. The mesh is snug, but not too tight. Following this, the incision is closed with running 2-0 Vicryl suture. Indigo carmine was given, and urine is seen from both orifices in the bladder. Following this, vaginal packing is placed. The patient received IV Tylenol, and IV Toradol, as well as antibiotics. . She was awakened taken recovery room in good condition.

## 2012-02-24 NOTE — Anesthesia Postprocedure Evaluation (Signed)
Anesthesia Post Note  Patient: Melissa Novak  Procedure(s) Performed: Procedure(s) (LRB): PUBO-VAGINAL SLING (N/A)  Anesthesia type: General  Patient location: PACU  Post pain: Pain level controlled  Post assessment: Post-op Vital signs reviewed  Last Vitals:  Filed Vitals:   02/24/12 1024  BP:   Pulse: 65  Temp:   Resp: 15    Post vital signs: Reviewed  Level of consciousness: sedated  Complications: No apparent anesthesia complications

## 2012-02-25 DIAGNOSIS — I442 Atrioventricular block, complete: Secondary | ICD-10-CM | POA: Diagnosis not present

## 2012-02-25 DIAGNOSIS — I498 Other specified cardiac arrhythmias: Secondary | ICD-10-CM | POA: Diagnosis not present

## 2012-02-25 NOTE — Progress Notes (Signed)
Pharmacy Brief Note - P&T Medication Policy  This patient's order for Flax Seed Oil capsules has been received.  Flax Seed Oil is classified as an "herbal" or "natural" product.  The P&T Committee does not permit the use of such products within the health system -- due to a lack of safety studies or FDA approval, non-standard manufacturing practices, and the potential risk of drug-drug interactions with inpatient medications.  The medication will be held (discontinued) during the patient's hospital stay.  Polo Riley R.Ph. 02/25/2012  12:07 PM

## 2012-02-25 NOTE — Progress Notes (Signed)
  Subjective: Patient reports: No pain.  Tolerates diet well.  Objective: Vital signs in last 24 hours: Temp:  [96.9 F (36.1 C)-98.1 F (36.7 C)] 97.8 F (36.6 C) (04/06 0530) Pulse Rate:  [65-94] 77  (04/06 0530) Resp:  [14-20] 18  (04/06 0530) BP: (105-141)/(50-77) 120/69 mmHg (04/06 0530) SpO2:  [93 %-100 %] 95 % (04/06 0530) Weight:  [203 lb 14.8 oz (92.5 kg)] 203 lb 14.8 oz (92.5 kg) (04/05 1822)  Intake/Output from previous day: 04/05 0701 - 04/06 0700 In: 2570 [I.V.:2570] Out: 2725 [Urine:2725] Intake/Output this shift:    Physical Exam:  General: Alert, oriented.   Foley draining clear urine.  Lungs - Normal respiratory effort, chest expands symmetrically.  Abdomen - Soft, non-tender & non-distended  Lab Results:  Basename 02/24/12 1730 02/24/12 0812  HGB 14.2 14.6  HCT 40.5 43.0   BMET  Basename 02/24/12 1730 02/24/12 0812  NA -- 144  K -- 4.1  CL -- 110  CO2 -- --  GLUCOSE -- 125*  BUN -- 24*  CREATININE 0.60 0.70  CALCIUM -- --     Assessment/Plan:       Voiding trial this morning.  Reinsert Foley if unable to void.  Home today if OK with Cardiology.   LOS: 1 day   Melissa Novak 02/25/2012, 7:18 AM

## 2012-02-25 NOTE — Progress Notes (Addendum)
Patient able to void after foley was D/C'd, urine output 250cc bloody no odor vaginal packing out during void; Dr Brunilda Payor aware and stated is okay to leave it out. PVR is 0. Patient in bed resting, feels better after the packing fell out, does not have any soreness/presuress around the vaginal area . Will continue to assess

## 2012-02-25 NOTE — Progress Notes (Signed)
Patient voiding clear yellow urine, incontinent at times. Still waiting on Dr Graciela Husbands to see patient prior to discharge. Notified Dr. Mayford Knife that Dr Graciela Husbands have not seen patient, Dr Mayford Knife stated patient may have to stay until tomorrow to be seen by Dr Graciela Husbands and might need pacemaker placed/inserted prior to discharge and MD also communicated with patient about the plan over the phone. Will continue to F/U with plan on care.

## 2012-02-25 NOTE — Progress Notes (Signed)
SUBJECTIVE:  Doing well post-op with no further pauses or heart block  OBJECTIVE:   Vitals:   Filed Vitals:   02/24/12 1822 02/24/12 2111 02/25/12 0130 02/25/12 0530  BP:  121/73 120/64 120/69  Pulse:  89 94 77  Temp:  98.1 F (36.7 C) 97.6 F (36.4 C) 97.8 F (36.6 C)  TempSrc:  Oral Oral Oral  Resp:  16 18 18   Height: 5\' 3"  (1.6 m)     Weight: 92.5 kg (203 lb 14.8 oz)     SpO2:  95% 95% 95%   I&O's:   Intake/Output Summary (Last 24 hours) at 02/25/12 0915 Last data filed at 02/25/12 0800  Gross per 24 hour  Intake   2610 ml  Output   2845 ml  Net   -235 ml   TELEMETRY: Reviewed telemetry pt in NSR     PHYSICAL EXAM General: Well developed, well nourished, in no acute distress Head: Eyes PERRLA, No xanthomas.   Normal cephalic and atramatic  Lungs:   Clear bilaterally to auscultation and percussion. Heart:   HRRR S1 S2 Pulses are 2+ & equal. Abdomen: Bowel sounds are positive, abdomen soft and non-tender without masses Extremities:   No clubbing, cyanosis or edema.  DP +1 Neuro: Alert and oriented X 3. Psych:  Good affect, responds appropriately   LABS: Basic Metabolic Panel:  Basename 02/24/12 1730 02/24/12 0812  NA -- 144  K -- 4.1  CL -- 110  CO2 -- --  GLUCOSE -- 125*  BUN -- 24*  CREATININE 0.60 0.70  CALCIUM -- --  MG -- --  PHOS -- --    Basename 02/24/12 1730 02/24/12 0812  WBC 11.7* --  NEUTROABS -- --  HGB 14.2 14.6  HCT 40.5 43.0  MCV 91.8 --  PLT 258 --     RADIOLOGY: No results found.    ASSESSMENT:  1.  Sick sinus syndrome with symptomatic bradycardia and heart transient heart block  PLAN:   1.  Case discussed with Dr. Graciela Husbands and most likely will need a PPM at some point but he prefers for her to recover from  Her bladder surgery first.  Will have Dr. Graciela Husbands do a consult to determine timing of pacemaker.  Quintella Reichert, MD  02/25/2012  9:15 AM

## 2012-02-26 DIAGNOSIS — R55 Syncope and collapse: Secondary | ICD-10-CM

## 2012-02-26 DIAGNOSIS — I4460 Unspecified fascicular block: Secondary | ICD-10-CM | POA: Diagnosis not present

## 2012-02-26 LAB — BASIC METABOLIC PANEL
BUN: 10 mg/dL (ref 6–23)
CO2: 30 mEq/L (ref 19–32)
Chloride: 103 mEq/L (ref 96–112)
Creatinine, Ser: 0.62 mg/dL (ref 0.50–1.10)
Glucose, Bld: 121 mg/dL — ABNORMAL HIGH (ref 70–99)

## 2012-02-26 LAB — APTT: aPTT: 29 seconds (ref 24–37)

## 2012-02-26 MED ORDER — SODIUM CHLORIDE 0.9 % IR SOLN
80.0000 mg | Status: DC
  Filled 2012-02-26: qty 2

## 2012-02-26 MED ORDER — VANCOMYCIN HCL IN DEXTROSE 1-5 GM/200ML-% IV SOLN
1000.0000 mg | INTRAVENOUS | Status: DC
  Filled 2012-02-26: qty 200

## 2012-02-26 MED ORDER — CHLORHEXIDINE GLUCONATE 4 % EX LIQD
60.0000 mL | Freq: Once | CUTANEOUS | Status: DC
  Filled 2012-02-26: qty 60

## 2012-02-26 MED ORDER — SODIUM CHLORIDE 0.45 % IV SOLN: INTRAVENOUS | Status: DC

## 2012-02-26 MED ORDER — CHLORHEXIDINE GLUCONATE 4 % EX LIQD
60.0000 mL | Freq: Once | CUTANEOUS | Status: AC
  Administered 2012-02-27: 4 via TOPICAL
  Filled 2012-02-26: qty 60

## 2012-02-26 MED ORDER — SODIUM CHLORIDE 0.9 % IV SOLN
INTRAVENOUS | Status: DC
  Administered 2012-02-27: 05:00:00 via INTRAVENOUS

## 2012-02-26 MED ORDER — OXYCODONE-ACETAMINOPHEN 5-325 MG PO TABS
1.0000 | ORAL_TABLET | ORAL | Status: DC | PRN
  Administered 2012-02-26 – 2012-02-27 (×3): 1 via ORAL
  Filled 2012-02-26: qty 2
  Filled 2012-02-26: qty 1
  Filled 2012-02-26: qty 2

## 2012-02-26 NOTE — Progress Notes (Signed)
  Subjective: Patient reports pain in the vaginal area when she tries to sit up or goes to the bathroom. Voids well. Urine clear.   Passing flatus.  Objective: Vital signs in last 24 hours: Temp:  [97.8 F (36.6 C)-98.2 F (36.8 C)] 97.8 F (36.6 C) (04/07 0455) Pulse Rate:  [82-84] 84  (04/07 1331) Resp:  [18] 18  (04/07 1331) BP: (129-161)/(65-79) 142/78 mmHg (04/07 1331) SpO2:  [91 %-93 %] 93 % (04/07 1331)  Intake/Output from previous day: 04/06 0701 - 04/07 0700 In: 1975 [P.O.:700; I.V.:1275] Out: 920 [Urine:920] Intake/Output this shift: Total I/O In: 480 [P.O.:480] Out: 600 [Urine:600]  Physical Exam:  Lungs - Normal respiratory effort, chest expands symmetrically.  Abdomen - Soft, non-tender & non-distended.  Bladder not distended. Pelvic examination: tenderness right side pelvic wall.  No hematoma, no mass.  Assessment/Plan:S/P pelvic floor reconstruction. For pacemaker implantation in AM.    LOS: 2 days   Melissa Novak 02/26/2012, 2:53 PM

## 2012-02-26 NOTE — Consult Note (Signed)
Electrophysiology Consult Note    Patient ID: Melissa Novak MRN: [id number], DOB/AGE: 76-May-1935 76 y.o.  Admit date: 02/24/2012 Date of Consult: 02/26/2012  Primary Physician: No primary provider on file. Primary Cardiologist: ms  Chief Complaint: PAUSES Reason for Consultation: AS ABOVE  HPI: the patient is a 78 year old lady WITH LONG-STANDING LEFT BUNDLE BRANCH BLOCK WAS ADMITTED THIS TIME FOR a urological procedure.  She has a history of syncope dating back a couple of years; there've been multiple presyncopal episodes. SHe frequently has been associated with rolling over in bed, raising of her head. Sometimes she has been watching television.  They're typically very abrupt in onset and offset occasionally there has been a short prodrome. Cardiac evaluation recently undertaken demonstrated normal left ventricular function by echo in the nonischemic Myoview.  She was given an event recorder. She had a typical episode the morning of her surgery and was called by the device company. There had been a correlated 4 second pause. The strips are not available or described by Dr. Hedy Jacob   The patient denies chest pain, shortness of breath, nocturnal dyspnea,.she does have some peripheral edema. She has had occasional palpitations but carries no diagnosis of atrial fibrillation.    Past Medical History  Diagnosis Date  . Diverticulosis   . Hypertension   . Prolapse of anterior vaginal wall   . History of shingles 2004 --  EYES  , NO RESIDUAL PAIN  . History of pulmonary embolism 1970'S  . Coronary artery disease CARDIOLOGIST- DR Caro Hight-  LAST VISIT THIS WEEK--  WILL REQUEST NOTE, STRESS TEST     PT DENIES S & S  . PONV (postoperative nausea and vomiting)       Surgical History:  Past Surgical History  Procedure Date  . Cholecystectomy 1967  . Vaginal hysterectomy 1974    STILL HAS OVARIES  . Cataract extraction w/ intraocular lens  implant, bilateral   . Transthoracic  echocardiogram 02-15-2012  DR SKAINS    NORMAL EF 60-65%/ MILD AORTIC SCLEROSIS, NO STENOSIS, GRADE I DIASTOLIC DYSFUNCTION  . Cardiovascular stress test 02-14-2012  DR SKAINS    LOW RISK NUCLEAR TEST/ MILD FIXED DEFECT ALONG THE DISTAL ANTERIOR SEPTAL WALL CONSTISTENT WITH LBBB/ NO ISCHEMIA      Prescriptions prior to admission  Medication Sig Dispense Refill  . amLODipine (NORVASC) 5 MG tablet Take 5 mg by mouth every morning.      . Calcium Carbonate-Vitamin D (CALCIUM + D PO) Take by mouth daily.      . cholecalciferol (VITAMIN D) 1000 UNITS tablet Take 1,000 Units by mouth daily.      . Coenzyme Q10 (COQ-10 PO) Take by mouth daily.      . Estradiol Acetate [facility]) Place vaginally.      . Flaxseed, Linseed, (FLAX SEED OIL PO) Take by mouth daily.      . furosemide (LASIX) 20 MG tablet Take 20 mg by mouth daily.      Marland Kitchen GARLIC PO Take by mouth daily.      . Misc Natural Products (OSTEO BI-FLEX JOINT SHIELD PO) Take by mouth daily.      . Multiple Vitamins-Minerals (ZINC PO) Take by mouth daily.      . Omega-3 Fatty Acids (FISH OIL PO) Take by mouth daily.      . psyllium (METAMUCIL) 58.6 % powder Take 1 packet by mouth daily.      Marland Kitchen Specialty Vitamins Products (MAGNESIUM, AMINO ACID CHELATE,) 133 MG tablet Take 1  tablet by mouth daily.      . valACYclovir (VALTREX) 1000 MG tablet Take 1,000 mg by mouth daily.      . vitamin C (ASCORBIC ACID) 500 MG tablet Take 500 mg by mouth daily.        Inpatient Medications:    . amLODipine  5 mg Oral q morning - 10a  . ciprofloxacin  250 mg Oral Q12H  . enoxaparin  30 mg Subcutaneous Q24H  . furosemide  20 mg Oral Daily  . psyllium  1 packet Oral Daily  . valACYclovir  1,000 mg Oral Daily  . vitamin C  500 mg Oral Daily  . DISCONTD: Flax Seed Oil   Oral q morning - 10a    Allergies:  Allergies  Allergen Reactions  . Sulfa Antibiotics Anaphylaxis  . Benadryl (Diphenhydramine Hcl) Rash and Other (See Comments)    BLURRED VISION  .  Bonine Rash and Other (See Comments)    BLURRED VISION  . Demerol Nausea Only and Rash  . Dramamine Rash and Other (See Comments)    BLURRED VISION  . Keflex Rash  . Penicillins Rash and Other (See Comments)    AND FEVER  . Talwin Rash and Other (See Comments)    AND FEVER    History   Social History  . Marital Status: Widowed    Spouse Name: N/A    Number of Children: N/A  . Years of Education: N/A   Occupational History  . Not on file.   Social History Main Topics  . Smoking status: Never Smoker   . Smokeless tobacco: Never Used  . Alcohol Use: Yes     RARE  . Drug Use: No  . Sexually Active:    Other Topics Concern  . Not on file   Social History Narrative  . No narrative on file     History reviewed. No pertinent family history. is notable for heart disease and cancer   Physical Exam:  Blood pressure 147/68, pulse 82, temperature 97.8 F (36.6 C), temperature source Oral, resp. rate 18, height 5\' 3"  (1.6 m), weight 203 lb 14.8 oz (92.5 kg), SpO2 91.00%.  General appearance: alert, cooperative, appears stated age and no distress HEENT:PERRLA Neck: no adenopathy, no carotid bruit, no JVD, supple, symmetrical, trachea midline, thyroid not enlarged, symmetric, no tenderness/mass/nodules and carotid massage negative Back: symmetric, no curvature. ROM normal. No CVA tenderness. Resp: clear to auscultation bilaterally Chest wall: no tenderness Cardio:regular rate & rhythm, no murmurs, S1 normal and S2 normal GI: soft, non-tender; bowel sounds normal; no masses,  no organomegaly Extremities: extremities normal, atraumatic, no cyanosis or edema Pulses: 2+ and symmetric Skin: Skin color, texture, turgor normal. No rashes or lesions Lymph nodes: Cervical, supraclavicular, and axillary nodes normal. Neuro:Alert and oriented x3. Gait normal. Reflexes and motor strength normal and symmetric. Cranial nerves 2-12 and sensation grossly intact. Psychnormal  affect   UJW:JXBJY rhythm with left bundle branch block QRS duration 136  Basic Metabolic Panel:  Lab 02/24/12 7829 02/24/12 0812  NA -- 144  K -- 4.1  CL -- 110  CO2 -- --  GLUCOSE -- 125*  BUN -- 24*  CREATININE 0.60 0.70  CALCIUM -- --  MG -- --  PHOS -- --   Cardiac Enzymes: No results found for this basename: CKTOTAL:3,CKMB:3,CKMBINDEX:3,TROPONINI:3 in the last 72 hours CBC:  Lab 02/24/12 1730 02/24/12 0812  WBC 11.7* --  NEUTROABS -- --  HGB 14.2 14.6  HCT 40.5 43.0  MCV  91.8 --  PLT 258 --       Assessment and plan #1 syncope-documented pauses. #2 left bundle branch block #3 normal left ventricular function  #4 status post urological surgery. The patient has had recurrent syncope and presyncope now with documented pauses. Unless there is a post procedural reason to suspect infection, I think it is reasonable to pursue pacemaker implantation prior to discharge to home. We have reviewed the potential benefits and risks including but not limited to infection, puncture of lung or heart, catastrophe,lead dislodgment. She understands these risks and is willing to proceed.  We'll plan to schedule this for tomorrow.  Thank you for the consultation  Signed, Sherryl Manges MD

## 2012-02-27 ENCOUNTER — Encounter (HOSPITAL_COMMUNITY)
Admission: RE | Disposition: A | Discharge status: Home / Self Care | Payer: Self-pay | Source: Ambulatory Visit | Attending: Urology

## 2012-02-27 ENCOUNTER — Encounter (HOSPITAL_BASED_OUTPATIENT_CLINIC_OR_DEPARTMENT_OTHER): Payer: Self-pay | Admitting: Urology

## 2012-02-27 DIAGNOSIS — I498 Other specified cardiac arrhythmias: Secondary | ICD-10-CM

## 2012-02-27 HISTORY — PX: PERMANENT PACEMAKER INSERTION: SHX5480

## 2012-02-27 LAB — SURGICAL PCR SCREEN
MRSA, PCR: NEGATIVE
Staphylococcus aureus: NEGATIVE

## 2012-02-27 SURGERY — PERMANENT PACEMAKER INSERTION
Anesthesia: LOCAL

## 2012-02-27 MED ORDER — ACETAMINOPHEN 325 MG PO TABS
325.0000 mg | ORAL_TABLET | ORAL | Status: DC | PRN
  Administered 2012-02-28: 650 mg via ORAL
  Filled 2012-02-27: qty 2

## 2012-02-27 MED ORDER — ONDANSETRON HCL 4 MG/2ML IJ SOLN
INTRAMUSCULAR | Status: AC
  Filled 2012-02-27: qty 2

## 2012-02-27 MED ORDER — MIDAZOLAM HCL 5 MG/5ML IJ SOLN
INTRAMUSCULAR | Status: AC
  Filled 2012-02-27: qty 5

## 2012-02-27 MED ORDER — OXYCODONE-ACETAMINOPHEN 5-325 MG PO TABS
ORAL_TABLET | ORAL | Status: AC
  Filled 2012-02-27: qty 1

## 2012-02-27 MED ORDER — FENTANYL CITRATE 0.05 MG/ML IJ SOLN
INTRAMUSCULAR | Status: AC
  Filled 2012-02-27: qty 2

## 2012-02-27 MED ORDER — ONDANSETRON HCL 4 MG/2ML IJ SOLN: 4.0000 mg | Freq: Four times a day (QID) | INTRAMUSCULAR | Status: DC | PRN

## 2012-02-27 MED ORDER — VANCOMYCIN HCL IN DEXTROSE 1-5 GM/200ML-% IV SOLN
1000.0000 mg | Freq: Two times a day (BID) | INTRAVENOUS | Status: AC
  Administered 2012-02-27: 1000 mg via INTRAVENOUS
  Filled 2012-02-27 (×2): qty 200

## 2012-02-27 NOTE — Interval H&P Note (Signed)
History and Physical Interval Note:  02/27/2012 11:01 AM  Melissa Novak  has presented today for surgery, with the diagnosis of bradycardia  The various methods of treatment have been discussed with the patient and family. After consideration of risks, benefits and other options for treatment, the patient has consented to  Procedure(s) (LRB): PERMANENT PACEMAKER INSERTION (N/A) as a surgical intervention .  The patients' history has been reviewed, patient examined, no change in status, stable for surgery.  I have reviewed the patients' chart and labs.  Questions were answered to the patient's satisfaction.     Lewayne Bunting

## 2012-02-27 NOTE — H&P (Signed)
  Subjective:  No chest pain or sob. Ready for PPM.  Objective:  Vital Signs in the last 24 hours: Temp:  [97.9 F (36.6 C)-98 F (36.7 C)] 97.9 F (36.6 C) (04/08 0538) Pulse Rate:  [75-100] 75  (04/08 0538) Resp:  [18] 18  (04/08 0538) BP: (138-174)/(64-78) 138/67 mmHg (04/08 0538) SpO2:  [91 %-93 %] 91 % (04/08 0538)  Intake/Output from previous day: 04/07 0701 - 04/08 0700 In: 2623.3 [P.O.:840; I.V.:1783.3] Out: 2300 [Urine:2300] Intake/Output from this shift:    Physical Exam: Well appearing NAD HEENT: Unremarkable Neck:  No JVD, no thyromegally Lymphatics:  No adenopathy Back:  No CVA tenderness Lungs:  Clear with no wheezes. HEART:  Regular rate rhythm, no murmurs, no rubs, no clicks Abd:  Flat, positive bowel sounds, no organomegally, no rebound, no guarding Ext:  2 plus pulses, no edema, no cyanosis, no clubbing Skin:  No rashes no nodules Neuro:  CN II through XII intact, motor grossly intact  Lab Results:  Basename 02/24/12 1730  WBC 11.7*  HGB 14.2  PLT 258    Basename 02/26/12 1500 02/24/12 1730  NA 139 --  K 4.1 --  CL 103 --  CO2 30 --  GLUCOSE 121* --  BUN 10 --  CREATININE 0.62 0.60   No results found for this basename: TROPONINI:2,CK,MB:2 in the last 72 hours Hepatic Function Panel No results found for this basename: PROT,ALBUMIN,AST,ALT,ALKPHOS,BILITOT,BILIDIR,IBILI in the last 72 hours No results found for this basename: CHOL in the last 72 hours No results found for this basename: PROTIME in the last 72 hours  Imaging: No results found.  Cardiac Studies:  Assessment/Plan:  1. Bradycardia with pauses of up to 6 seconds. I have discussed the risks/benefits/goals/expectations of PPM with the patient and she wishes to proceed.  LOS: 3 days    Lewayne Bunting 02/27/2012, 10:57 AM

## 2012-02-27 NOTE — Op Note (Signed)
DDD PPM inserted via the left subclavian vein without immediate complication. J#[id number].

## 2012-02-27 NOTE — Progress Notes (Signed)
3 Days Post-Op Subjective: Patient reports severe pelvic/vaginal pain on Saturday. Vaginal exam negative for hematoma or bleeding. She now can walk, but has pain sitting, particularly on the R side of her pelvis. She also has some difficulty walking. Normal breathing. No hemoptysis. She is dur for pacemaker insertion today. She has been on Lovonox for 78 yo hx of PE.   Objective: Vital signs in last 24 hours: Temp:  [97.9 F (36.6 C)-98 F (36.7 C)] 97.9 F (36.6 C) (04/08 0538) Pulse Rate:  [75-100] 75  (04/08 0538) Resp:  [18] 18  (04/08 0538) BP: (138-174)/(64-79) 138/67 mmHg (04/08 0538) SpO2:  [91 %-93 %] 91 % (04/08 0538)  Intake/Output from previous day: 04/07 0701 - 04/08 0700 In: 2623.3 [P.O.:840; I.V.:1783.3] Out: 2300 [Urine:2300] Intake/Output this shift:    Physical Exam:  General:alert, cooperative, no distress and mildly obese GI: not done and soft, non tender, normal bowel sounds, no palpable masses, no organomegaly, no inguinal hernia Female genitalia: not done Bladder no bladder distension noted Vulva: normal appearing vulva with no masses, tenderness or lesions Resp: clear to auscultation bilaterally  Lab Results:  Basename 02/24/12 1730  HGB 14.2  HCT 40.5   BMET  Basename 02/26/12 1500 02/24/12 1730  NA 139 --  K 4.1 --  CL 103 --  CO2 30 --  GLUCOSE 121* --  BUN 10 --  CREATININE 0.62 0.60  CALCIUM 9.6 --    Basename 02/26/12 1500  LABPT --  INR 0.98   No results found for this basename: LABURIN:1 in the last 72 hours No results found for this or any previous visit.  Studies/Results: No results found.  Assessment/Plan: 3 dqys post anterior vaginal vault repair for large Stage 3 cystocele, with pain in the R side of the pelvis with sitting and walking. Other Urologic problems include ( 1) high rectocele; (2) small capacity bladder; (3) Overactive bladder instability ( "non-neurogenic, neurogenic bladder")   She lives by herself, and  her son will be here for a few days; however he is not capable of helping her with bathing, etc.Vaginal exam negative Saturday for hematoma. Her exam today negative for leg or pelvic abnormality. Question of whether she may need R sacrospinus suture cut, or whether her pain will dissipate. She is to have pacemaker insertion today, and we will observe her. If discharged, she will sit in hot baths, and has appointment Friday for follow-up; PLAN: 1. Pacemaker today. On lovenox-continuation per Cardiology 2. If discharged after pacemaker, then she will RTC Friday ( scripts done, and in chart).  3. If she returns to [facility], then will obserfe her for pain and the ability to perform personal functions. Hopefully, her pain level will subside ( anti-inflammatories and ? Steroids), and heat to her sacrum.    LOS: 3 days   Beecher Furio I 02/27/2012, 8:23 AM

## 2012-02-28 ENCOUNTER — Inpatient Hospital Stay (HOSPITAL_COMMUNITY): Payer: Medicare Other

## 2012-02-28 DIAGNOSIS — N8111 Cystocele, midline: Secondary | ICD-10-CM | POA: Diagnosis not present

## 2012-02-28 DIAGNOSIS — Z950 Presence of cardiac pacemaker: Secondary | ICD-10-CM | POA: Diagnosis not present

## 2012-02-28 MED ORDER — TRAMADOL-ACETAMINOPHEN 37.5-325 MG PO TABS
1.0000 | ORAL_TABLET | Freq: Three times a day (TID) | ORAL | Status: DC
  Filled 2012-02-28 (×2): qty 1

## 2012-02-28 MED ORDER — FLEET ENEMA 7-19 GM/118ML RE ENEM: 1.0000 | ENEMA | Freq: Every day | RECTAL | Status: DC | PRN

## 2012-02-28 MED ORDER — HYDROCODONE-ACETAMINOPHEN 5-325 MG PO TABS
1.0000 | ORAL_TABLET | Freq: Three times a day (TID) | ORAL | Status: DC
  Administered 2012-02-28 – 2012-02-29 (×3): 1 via ORAL
  Filled 2012-02-28 (×3): qty 1

## 2012-02-28 MED FILL — Fentanyl Citrate Inj 0.05 MG/ML: INTRAMUSCULAR | Qty: 2 | Status: AC

## 2012-02-28 NOTE — Progress Notes (Signed)
  Subjective: Pt off floor for CXR. Awaiting evaluation of pain in pelvis.   Objective: Vital signs in last 24 hours: Temp:  [98.1 F (36.7 C)-98.6 F (37 C)] 98.6 F (37 C) (04/09 9604) Pulse Rate:  [87-91] 87  (04/09 0614) Resp:  [15-16] 16  (04/09 0614) BP: (153-159)/(75-82) 155/75 mmHg (04/09 0614) SpO2:  [91 %-96 %] 96 % (04/09 0614) Weight:  [97.2 kg (214 lb 4.6 oz)] 97.2 kg (214 lb 4.6 oz) (04/08 1700)A  Intake/Output from previous day: 04/08 0701 - 04/09 0700 In: 200 [IV Piggyback:200] Out: 201 [Urine:200; Emesis/NG output:1] Intake/Output this shift:    Past Medical History  Diagnosis Date  . Diverticulosis   . Hypertension   . Prolapse of anterior vaginal wall   . History of shingles 2004 --  EYES  , NO RESIDUAL PAIN  . History of pulmonary embolism 1970'S  . Coronary artery disease CARDIOLOGIST- DR Caro Hight-  LAST VISIT THIS WEEK--  WILL REQUEST NOTE, STRESS TEST     PT DENIES S & S  . PONV (postoperative nausea and vomiting)     Physical Exam:  General: Lungs - Normal respiratory effort, chest expands symmetrically.  Abdomen - Soft, non-tender & non-distended.  Lab Results: No results found for this basename: WBC:3,HGB:3,HCT:3 in the last 72 hours BMET  Basename 02/26/12 1500  NA 139  K 4.1  CL 103  CO2 30  GLUCOSE 121*  BUN 10  CREATININE 0.62  CALCIUM 9.6   No results found for this basename: LABURIN:1 in the last 72 hours Results for orders placed during the hospital encounter of 02/24/12  SURGICAL PCR SCREEN     Status: Normal   Collection Time   02/27/12  4:50 AM      Component Value Range Status Comment   MRSA, PCR NEGATIVE  NEGATIVE  Final    Staphylococcus aureus NEGATIVE  NEGATIVE  Final     Studies/Results: @RISRSLT24 @  Assessment/Plan: Pt with pelvic pain post pelvic floor reconstruction. She is post pacemaker, and now awaiting provocative evaluation this AM. I have discussed this with her nurse. She will walk in the hallway, and  also sit in chair to evaluate for pelvic pain. She may be a candidate for anti-inflammatory, or possibly cutting her R sacrospinus ligament suture.   Kaysi Ourada I 02/28/2012, 8:46 AM

## 2012-02-28 NOTE — Progress Notes (Deleted)
Patient"s peripheral IV came out, refused to have another IV stick and refused to have anymore IVF. Patient is stable. Dr. Kaylyn Layer was made aware.

## 2012-02-28 NOTE — Op Note (Signed)
NAME:  Melissa Novak, Melissa Novak                   ACCOUNT NO.:  MEDICAL RECORD NO.:  [id number]  LOCATION:                                 FACILITY:  PHYSICIAN:  Doylene Canning. Ladona Ridgel, MD    DATE OF BIRTH:  08/11/1934  DATE OF PROCEDURE:  02/27/2012 DATE OF DISCHARGE:                              OPERATIVE REPORT   PROCEDURE PERFORMED:  Insertion of a dual-chamber pacemaker.  INDICATION:  Symptomatic bradycardia with intermittent Stokes-Adams attack, and syncope.  INTRODUCTION:  The patient is a very pleasant 78 year old woman, who was brought in for urologic procedure, but was found to have severe bradycardia and a concomitant history of left bundle-branch block and syncope.  She had pauses of 6 seconds.  She is now referred for permanent pacemaker insertion.  PROCEDURE:  After informed consent was obtained, the patient was taken to the diagnostic EP lab in a fasting state.  After usual preparation and draping, intravenous fentanyl and midazolam were given for sedation. A 30 mL of lidocaine was infiltrated into the left infraclavicular region.  A 5-cm incision was carried out over this region. Electrocautery was utilized to dissect down to the fascial plane.  The left subclavian vein was then punctured x2 and a St. Jude model 2088THE, 52 cm, active fixation pacing lead, serial number [id number] was advanced into the right ventricle.  With insertion of the lead into the right ventricle, the patient had a complete heart block and no escape. Ultimately her ventricular escape came back at 28 beats per minute. Attention then turned to placement of the atrial lead.  St. Jude model 2088THE, 46 cm, active fixation pacing lead, serial number [id number] was advanced into the right atrium.  Mapping was then carried out in the right ventricle.  At the final site, the R-waves measured 18 mV and with the lead actively fixed, the pacing impedance was 400 ohms.  Threshold was 1 V at 0.4 msec.  With the  ventricular lead in satisfactory position, attention then turned to placement of the atrial lead and was placed in anterolateral portion of the right atrium where P-waves measured 6 mV, and impedance was 430 ohms.  Threshold was 0.8 V at 0.4 msec.  A 10 V pacing both in the atrium and the ventricle did not stimulate the diaphragm.  With the atrial and ventricular leads in satisfactory position, they were secured to the subpectoral fascia with a figure-of-eight silk suture.  The sewing sleeve was secured with silk suture.  Electrocautery was then utilized to make a subcutaneous pocket. Antibiotic irrigation was utilized to irrigate the pocket. Electrocautery was utilized to assure hemostasis.  The St. Jude accent DR RF dual-chamber pacemaker, serial number [id number] was then connected to the atrial and ventricular leads and placed back in the subcutaneous pocket.  The generator was secured with silk suture.  The incision was then closed with 2-0 and 3-0 Vicryl.  Benzoin and Steri-Strips painted on the skin.  A pressure dressing was applied and the patient was returned to her room in satisfactory condition.  COMPLICATIONS:  There were no immediate procedure complications.  RESULTS:  This demonstrated successful implantation of a St. Jude dual-  chamber pacemaker in a patient with symptomatic bradycardia and intermittent complete heart block.     Doylene Canning. Ladona Ridgel, MD     GWT/MEDQ  D:  02/27/2012  T:  02/27/2012  Job:  [id number]  cc:   Lynelle Smoke I. Patsi Sears, M.D.

## 2012-02-28 NOTE — Progress Notes (Signed)
Pt has had intermittent pain throughout the day.  Pain is positional and rated at a 7-8/10.  Pt says that pain is in her vagina.  As long as the pt doesn't get in a bad position she is in no pain.  Pt does state that the pain is much better today, less frequent and less intense.  Pt walked in the room and got up to the chair twice.  Pain is most evident in the action of standing up or sitting down.

## 2012-02-28 NOTE — Progress Notes (Signed)
Subjective:  Post op pacer. St Jude. Dr. Ladona Ridgel.  Functioning well.   Objective:  Vital Signs in the last 24 hours: Temp:  [98.1 F (36.7 C)-98.6 F (37 C)] 98.6 F (37 C) (04/09 1610) Pulse Rate:  [87-91] 87  (04/09 0614) Resp:  [15-16] 16  (04/09 0614) BP: (153-159)/(75-82) 155/75 mmHg (04/09 0614) SpO2:  [91 %-96 %] 96 % (04/09 0614) Weight:  [97.2 kg (214 lb 4.6 oz)] 97.2 kg (214 lb 4.6 oz) (04/08 1700)  Intake/Output from previous day: 04/08 0701 - 04/09 0700 In: 200 [IV Piggyback:200] Out: 201 [Urine:200; Emesis/NG output:1]   Physical Exam: Gen: AAO x3 CV: RRR Lungs: CTA B Pacer site appears normal   Lab Results: No results found for this basename: WBC:2,HGB:2,PLT:2 in the last 72 hours  Basename 02/26/12 1500  NA 139  K 4.1  CL 103  CO2 30  GLUCOSE 121*  BUN 10  CREATININE 0.62    Dg Chest 2 View  02/28/2012  *RADIOLOGY REPORT*  Clinical Data: Pacemaker insertion.  CHEST - 2 VIEW  Comparison: 04/13/2010  Findings: Left pacer is in place with leads in the right atrium and right ventricle.  No pneumothorax.  Heart is borderline in size. Lungs are clear.  No effusions or edema.  No acute bony abnormality.  IMPRESSION: Left pacer insertion without pneumothorax.  No active disease.  Original Report Authenticated By: Cyndie Chime, M.D.   Personally viewed.   Telemetry: No pauses. V pace intermittently.  Personally viewed.   Assessment/Plan:   LBBB/ syncope/ symptomatic bradycardia   - pacer placed St. Jude yesterday by Dr. Ladona Ridgel  - she will follow up in his EP device clinic for post pacer insertion check.   - should be fine from CV standpoint for discharge if ok with EP.   - I will contact EP to make sure she is ready from our standpoint.   - We will see her back in f/u.   Melissa Novak 02/28/2012, 10:54 AM

## 2012-02-29 ENCOUNTER — Encounter (HOSPITAL_COMMUNITY): Payer: Self-pay

## 2012-02-29 DIAGNOSIS — Z950 Presence of cardiac pacemaker: Secondary | ICD-10-CM | POA: Diagnosis present

## 2012-02-29 DIAGNOSIS — R001 Bradycardia, unspecified: Secondary | ICD-10-CM | POA: Diagnosis present

## 2012-02-29 MED ORDER — CIPROFLOXACIN HCL 500 MG PO TABS: 500.0000 mg | ORAL_TABLET | Freq: Two times a day (BID) | ORAL | Status: DC

## 2012-02-29 MED ORDER — HYDROCODONE-ACETAMINOPHEN 7.5-650 MG PO TABS: 1.0000 | ORAL_TABLET | Freq: Four times a day (QID) | ORAL | Status: AC | PRN

## 2012-02-29 NOTE — Discharge Summary (Signed)
Physician Discharge Summary  Patient ID: STACIE TEMPLIN MRN: [id number] DOB/AGE: [birth year] 78 y.o.  Admit date: 02/24/2012 Discharge date: 02/29/2012  Admission Diagnoses: anterior pelvic floor prolapse  Discharge Diagnoses:  Same plus heart block requiring pacemaker insertion.  Discharged Condition: improved  Hospital Course: surgery and pacemaker insertion.  Consults: cardiology  Significant Diagnostic Studies: cardiac graphics: ECG:   Treatments: Surgery for pelvic floor prolapse and pacemaker insertion.  Discharge Exam: Blood pressure 144/79, pulse 84, temperature 97.6 F (36.4 C), temperature source Oral, resp. rate 18, height 5\' 3"  (1.6 m), weight 97.2 kg (214 lb 4.6 oz), SpO2 96.00%. General appearance: alert, cooperative and no distress Resp: clear to auscultation bilaterally Cardio: regular rate and rhythm GI: soft, non-tender; bowel sounds normal; no masses,  no organomegaly Pacemaker site looks good.  Disposition:   Discharge Orders    Future Appointments: Provider: Department: Dept Phone: Center:   03/08/2012 2:30 PM [facility] [phone] [facility]     Future Orders Please Complete By Expires   Diet - low sodium heart healthy      Increase activity slowly      Discontinue IV        Medication List  As of 02/29/2012  8:32 AM   TAKE these medications         amLODipine 5 MG tablet   Commonly known as: NORVASC   Take 5 mg by mouth every morning.      CALCIUM + D PO   Take by mouth daily.      cholecalciferol 1000 UNITS tablet   Commonly known as: VITAMIN D   Take 1,000 Units by mouth daily.      ciprofloxacin 500 MG tablet   Commonly known as: CIPRO   Take 1 tablet (500 mg total) by mouth 2 (two) times daily.      ciprofloxacin 500 MG tablet   Commonly known as: CIPRO   Take 1 tablet (500 mg total) by mouth 2 (two) times daily.      COQ-10 PO   Take by mouth daily.      [facility]   Place vaginally.     FISH OIL PO   Take by mouth daily.      FLAX SEED OIL PO   Take by mouth daily.      furosemide 20 MG tablet   Commonly known as: LASIX   Take 20 mg by mouth daily.      GARLIC PO   Take by mouth daily.      hydrocodone-acetaminophen 7.5-650 MG per tablet   Commonly known as: LORCET PLUS   Take 1 tablet by mouth every 6 (six) hours as needed for pain.      hydrocodone-acetaminophen 7.5-650 MG per tablet   Commonly known as: LORCET PLUS   Take 1 tablet by mouth every 6 (six) hours as needed for pain.      magnesium (amino acid chelate) 133 MG tablet   Take 1 tablet by mouth daily.      OSTEO BI-FLEX JOINT SHIELD PO   Take by mouth daily.      psyllium 58.6 % powder   Commonly known as: METAMUCIL   Take 1 packet by mouth daily.      valACYclovir 1000 MG tablet   Commonly known as: VALTREX   Take 1,000 mg by mouth daily.      vitamin C 500 MG tablet   Commonly known as: ASCORBIC ACID   Take 500 mg  by mouth daily.      ZINC PO   Take by mouth daily.           Follow-up Information    Follow up with Randie Bloodgood I, MD in 7 days. (call for appointment in 2 weeks)    Contact information:   [address] [place], [address] [facility] [place] [address] [phone]       Follow up with Donato Schultz, MD on 03/14/2012. (11am)    Contact information:   [address] [place] [address] [phone]       Follow up with [facility] on 03/08/2012. (2:30 PM)    Contact information:   [address] [place] [phone]         Signed: [name] I 02/29/2012, 8:32 AM

## 2012-02-29 NOTE — Discharge Instructions (Signed)
Supplemental Discharge Instructions for  Pacemaker/Defibrillator Patients  Activity No heavy lifting or vigorous activity with your left/right arm for 6 to 8 weeks.  Do not raise your left/right arm above your head for one week.  Gradually raise your affected arm as drawn below.                         4/13               /        4/14              /           4/15          /           4/16         NO DRIVING for      ; you may begin driving on  1/61   . WOUND CARE   Keep the wound area clean and dry.  Do not get this area wet for one week. No showers for one week; you may shower on    4/16     .   The tape/steri-strips on your wound will fall off; do not pull them off.  No bandage is needed on the site.  DO  NOT apply any creams, oils, or ointments to the wound area.   If you notice any drainage or discharge from the wound, any swelling or bruising at the site, or you develop a fever > 101? F after you are discharged home, call the office at once.  Special Instructions   You are still able to use cellular telephones; use the ear opposite the side where you have your pacemaker/defibrillator.  Avoid carrying your cellular phone near your device.   When traveling through airports, show security personnel your identification card to avoid being screened in the metal detectors.  Ask the security personnel to use the hand wand.   Avoid arc welding equipment, MRI testing (magnetic resonance imaging), TENS units (transcutaneous nerve stimulators).  Call the office for questions about other devices.   Avoid electrical appliances that are in poor condition or are not properly grounded.   Microwave ovens are safe to be near or to operate. _______________  Home Care Instructions:  Activity Rest for the remainder of the day. Do not drive or operate equipment today. You may resume normal activities in one to two days unless specified otherwise by Dr. Brunilda Payor.  Meals Drink plenty of liquids and eat  light foods such as gelatin or soup this evening. You may return to a normal meal plan tomorrow.  Return to work You may return to work in one to two days unless specified otherwise by Dr. Brunilda Payor.  Special Instructions Call [phone] if you have any of the following symptoms:  Persistent or heavy bleeding  Bleeding which continues after the first few urinations  Large blood clots that are difficult to pass  Urine stream diminishes or stops completely  Fever equal to or higher than 101 degrees F  Cloudy urine with a strong, foul odor  Severe pain  Females should always wipe from front to back. You may feel some burning pain when you urinate. This should disappear with time. Applying moist heat to the lower abdomen or a hot tub bath may help relieve the pain.  Follow-up In one week.    Additional Instructions    Anterior and Posterior  Repair, Colporraphy Anterior and posterior repair colporraphy is an operation used to fix a prolapse of organs in the genital tract. Prolapse means the falling down, bulging, dropping or drooping of an organ. Organs that commonly prolapse include the rectum, bladder, vagina and uterus. Prolapse can affect a single organ or several organs at the same time. This often worsens when women stop having their monthly periods (menopause) because estrogen loss weakens the muscles and tissues in the genital tract. In addition, prolapse happens when the organs are damaged or weakened. This commonly happens after childbirth and from aging.   Some women feel pelvic pressure or have trouble holding their urine right after childbirth because of stretching and damage to pelvic tissues around the bladder.   Some women have trouble going to the bathroom (defecating) because of trapped stool in the rectum. This is because of damaged tissue around the rectum.  These troubles generally get better with time, but may get worse or return with aging. Different types of surgery  are often the only form of treatment for severe prolapses.  TYPES OF GENITAL PROLAPSE YOUR CAREGIVER MAY DISCUSS ARE:  A cystocele is a prolapse of the upper (anterior) wall of the vagina. The anterior wall bulges into the vagina and brings the bladder with it.   A rectocele is a prolapse of the lower (posterior) wall of the vagina. The posterior vaginal wall bulges into the vagina and brings the rectum with it.   An enterocele is a prolapse of part of the pelvic organs called the Pouch of [place]. It also involves a portion of the small bowel. It appears as a bulge under the cervix (neck of the womb) at the top of the back wall of the vagina.   A procidentia is a complete prolapse of the uterus and the cervix. The prolapse can be seen and felt coming out of the vagina.   If the uterus is prolapsed, the uterus may be removed (hysterectomy). Your caregiver will discuss the risks and benefits with you.  LET YOUR CAREGIVER KNOW ABOUT:   Allergies to foods and medications.   All the medications you are taking including herbs, over-the-counter medications, eye drops and creams.   Use of illegal drugs.   Smoking or heavy alcohol drinking habits.   Past problems with anesthetics or novocaine.   Possible pregnancy, if this applies.   History of blood clots or other types of blood problems.   History of bleeding problems.   Past surgery.   Other medical or health problems.  RISKS AND COMPLICATIONS  Infection. A germ starts growing in the wound. This can usually be treated with antibiotics.   Damage to other organs during surgery.   Bleeding after surgery. Your surgeon takes every precaution to keep this from happening.   Problems urinating. A catheter may be required for a few days.   Prolapse may happen again.   Problems from the anesthesia.  BEFORE THE PROCEDURE  Do not take aspirin or blood thinners a week before your surgery unless told otherwise.   Do not eat or drink  anything 8 hours before your surgery.   Let your caregiver know if you get a cold or an infection before your surgery.   Plan and arrange for help when you go home from the hospital.   If you smoke, do not smoke for at least 2 weeks before the surgery.  PROCEDURE  Usually you are given medication to relax you before the surgery. An IV  will be placed in an arm vein for the anesthetic and medications. The anesthesia, spinal, or epidural puts you to sleep. You will be awake but will be free of pain during surgery.  Different repairs include:  An anterior repair. A cut (incision) is made in the midline section of the front part of the vaginal wall. A triangular shaped piece of vaginal tissue is removed and the stronger healthier tissue is sewn together in order to re-support and suspend the bladder.   A posterior repair. An incision is made midline on the back wall of the vagina. A triangular portion of vaginal skin is removed to expose the muscle. Excess tissue is removed and stronger, healthier muscle and ligament tissue is sewn together to support the rectum.   In an A and P repair, both of the above procedures are done during the same operation.  AFTER THE PROCEDURE You will be taken to a recovery area. Nurses will check your progress and monitor your vital signs (blood pressure, pulse, breathing and temperature). This is done until you are stable. Then you will be transferred to your room. After surgery, you will have a urinary catheter (a small rubber tube to drain your bladder). This will be in place for 2 to 7 days or until your bladder is working properly on its own. The intravenous will be removed in 1 to 3 days. You may have a gauze packing in your vagina to prevent bleeding that will be removed 2 or 3 days after the surgery. You are usually in the hospital 3 to 5 days. SEEK IMMEDIATE MEDICAL CARE IF:   Increased bleeding (more than a small spot) from the vagina develops.   You notice  redness, swelling, or increasing pain in vaginal area.   You develop abdominal pain or pain which is getting worse rather than better.   There is pus coming from wounds.   An unexplained oral temperature over 101 F (38.3 C) develops.   There is a bad smell coming from your vaginal area.   You feel lightheaded or faint.  Document Released: 01/28/2004 Document Revised: 10/27/2011 Document Reviewed: 05/30/2008 [facility] Patient Information 2012 [place], [place].Prolapse  Prolapse means the falling down, bulging, dropping, or drooping of a body part. Organs that commonly prolapse include the rectum, small intestine, bladder, urethra, vagina (birth canal), uterus (womb), and cervix. Prolapse occurs when the ligaments and muscle tissue around the rectum, bladder, and uterus are damaged or weakened.  CAUSES  This happens especially with:  Childbirth. Some women feel pelvic pressure or have trouble holding their urine right after childbirth, because of stretching and tearing of pelvic tissues. This generally gets better with time and the feeling usually goes away, but it may return with aging.   Chronic heavy lifting.   Aging.   Menopause, with loss of estrogen production weakening the pelvic ligaments and muscles.   Past pelvic surgery.   Obesity.   Chronic constipation.   Chronic cough.  Prolapse may affect a single organ, or several organs may prolapse at the same time. The front wall of the vagina holds up the bladder. The back wall holds up part of the lower intestine, or rectum. The uterus fills a spot in the middle. All these organs can be involved when the ligaments and muscles around the vagina relax too much. This often gets worse when women stop producing estrogen (menopause). SYMPTOMS  Uncontrolled loss of urine (incontinence) with cough, sneeze, straining, and exercise.   More force may  be required to have a bowel movement, due to trapping of the stool.   When part of an  organ bulges through the opening of the vagina, there is sometimes a feeling of heaviness or pressure. It may feel as though something is falling out. This sensation increases with coughing or bearing down.   If the organs protrude through the opening of the vagina and rub against the clothing, there may be soreness, ulcers, infection, pain, and bleeding.   Lower back pain.   Pushing in the upper or lower part of the vagina, to pass urine or have a bowel movement.   Problems having sexual intercourse.   Being unable to insert a tampon or applicator.  DIAGNOSIS  Usually, a physical exam is all that is needed to identify the problem. During the examination, you may be asked to cough and strain while lying down, sitting up, and standing up. Your caregiver will determine if more testing is required, such as bladder function tests. Some diagnoses are:  Cystocele: Bulging and falling of the bladder into the top of the vagina.   Rectocele: Part of the rectum bulging into the vagina.   Prolapse of the uterus: The uterus falls or drops into the vagina.   Enterocele: Bulging of the top of the vagina, after a hysterectomy (uterus removal), with the small intestine bulging into the vagina. A hernia in the top of the vagina.   Urethrocele: The urethra (urine carrying tube) bulging into the vagina.  TREATMENT  In most cases, prolapse needs to be treated only if it produces symptoms. If the symptoms are interfering with your usual daily or sexual activities, treatment may be necessary. The following are some measures that may be used to treat prolapse.  Estrogen may help elderly women with mild prolapse.   Kegel exercises may help mild cases of prolapse, by strengthening and tightening the muscles of the pelvic floor.   Pessaries are used in women who choose not to, or are unable to, have surgery. A pessary is a doughnut-shaped piece of plastic or rubber that is put into the vagina to keep the organs  in place. This device must be fitted by your caregiver. Your caregiver will also explain how to care for yourself with the pessary. If it works well for you, this may be the only treatment required.   Surgery is often the only form of treatment for more severe prolapses. There are different types of surgery available. You should discuss what the best procedure is for you. If the uterus is prolapsed, it may be removed (hysterectomy) as part of the surgical treatment. Your caregiver will discuss the risks and benefits with you.   Uterine-vaginal suspension (surgery to hold up the organs) may be used, especially if you want to maintain your fertility.  No form of treatment is guaranteed to correct the prolapse or relieve the symptoms. HOME CARE INSTRUCTIONS   Wear a sanitary pad or absorbent product if you have incontinence of urine.   Avoid heavy lifting and straining with exercise and work.   Take over-the-counter pain medicine for minor discomfort.   Try taking estrogen or using estrogen vaginal cream.   Try Kegel exercises or use a pessary, before deciding to have surgery.   Do Kegel exercises after having a baby.  SEEK MEDICAL CARE IF:   Your symptoms interfere with your daily activities.   You need medicine to help with the discomfort.   You need to be fitted with a pessary.  You notice bleeding from the vagina.   You think you have ulcers or you notice ulcers on the cervix.   You have an oral temperature above 102 F (38.9 C).   You develop pain or blood with urination.   You have bleeding with a bowel movement.   The symptoms are interfering with your sex life.   You have urinary incontinence that interferes with your daily activities.   You lose urine with sexual intercourse.   You have a chronic cough.   You have chronic constipation.  Document Released: 05/14/2003 Document Revised: 10/27/2011 Document Reviewed: 11/22/2009 [facility] Patient Information 2012  [place], [place].

## 2012-02-29 NOTE — Progress Notes (Signed)
  Subjective: Patient reports  Lessening of pain. Now able to take care of herself at home. + BM yesterday.   Objective: Vital signs in last 24 hours: Temp:  [97.6 F (36.4 C)-98.6 F (37 C)] 97.6 F (36.4 C) (04/10 0626) Pulse Rate:  [84-92] 84  (04/10 0626) Resp:  [18] 18  (04/10 0626) BP: (125-144)/(63-80) 144/79 mmHg (04/10 0626) SpO2:  [94 %-97 %] 96 % (04/10 0626)A  Intake/Output from previous day: 04/09 0701 - 04/10 0700 In: 480 [P.O.:480] Out: 200 [Urine:200] Intake/Output this shift:    Past Medical History  Diagnosis Date  . Diverticulosis   . Hypertension   . Prolapse of anterior vaginal wall   . History of shingles 2004 --  EYES  , NO RESIDUAL PAIN  . History of pulmonary embolism 1970'S  . Coronary artery disease CARDIOLOGIST- DR Caro Hight-  LAST VISIT THIS WEEK--  WILL REQUEST NOTE, STRESS TEST     PT DENIES S & S  . PONV (postoperative nausea and vomiting)     Physical Exam:  General:wdwnwf in nad. Lungs - Normal respiratory effort, chest expands symmetrically.  Abdomen - Soft, non-tender & non-distended.  Lab Results: No results found for this basename: WBC:3,HGB:3,HCT:3 in the last 72 hours BMET  Basename 02/26/12 1500  NA 139  K 4.1  CL 103  CO2 30  GLUCOSE 121*  BUN 10  CREATININE 0.62  CALCIUM 9.6   No results found for this basename: LABURIN:1 in the last 72 hours Results for orders placed during the hospital encounter of 02/24/12  SURGICAL PCR SCREEN     Status: Normal   Collection Time   02/27/12  4:50 AM      Component Value Range Status Comment   MRSA, PCR NEGATIVE  NEGATIVE  Final    Staphylococcus aureus NEGATIVE  NEGATIVE  Final     Studies/Results: @RISRSLT24 @  Assessment/Plan: Improved today. Ready for D/c. Doing well from pacemaker. Will resume home meds ( including ASA).   Sherrey North I 02/29/2012, 8:26 AM

## 2012-02-29 NOTE — Progress Notes (Signed)
Patient is ready to go home. No complaints. Pelvic pain has decreased.  Pacemaker site currently dressed. I have instructed nursing to remove dressing in Compliance with normal/standard post pacemaker placement instructions.  Lungs are clear She is wearing a sling  She will be seeing Dr. Ladona Ridgel in 7-10 days in wound followup. I have discussed this with Joice Lofts, EP nurse.  Assessment and plan -Symptomatic bradycardia  - Status post pacemaker placement. He does not need to wear LifeWatch monitor any further.   We will also be seeing her in followup.

## 2012-03-07 DIAGNOSIS — N8111 Cystocele, midline: Secondary | ICD-10-CM | POA: Diagnosis not present

## 2012-03-07 DIAGNOSIS — N3941 Urge incontinence: Secondary | ICD-10-CM | POA: Diagnosis not present

## 2012-03-08 ENCOUNTER — Encounter: Payer: Self-pay | Admitting: Internal Medicine

## 2012-03-08 ENCOUNTER — Ambulatory Visit (INDEPENDENT_AMBULATORY_CARE_PROVIDER_SITE_OTHER): Payer: Medicare Other | Admitting: *Deleted

## 2012-03-08 DIAGNOSIS — I498 Other specified cardiac arrhythmias: Secondary | ICD-10-CM | POA: Diagnosis not present

## 2012-03-08 DIAGNOSIS — I442 Atrioventricular block, complete: Secondary | ICD-10-CM

## 2012-03-08 DIAGNOSIS — Z950 Presence of cardiac pacemaker: Secondary | ICD-10-CM

## 2012-03-08 DIAGNOSIS — R001 Bradycardia, unspecified: Secondary | ICD-10-CM

## 2012-03-08 NOTE — Progress Notes (Signed)
Wound check pacer in clinic  

## 2012-03-09 LAB — PACEMAKER DEVICE OBSERVATION
AL IMPEDENCE PM: 350 Ohm
ATRIAL PACING PM: 1
BATTERY VOLTAGE: 3.1885 V
RV LEAD IMPEDENCE PM: 437.5 Ohm

## 2012-03-13 DIAGNOSIS — N8111 Cystocele, midline: Secondary | ICD-10-CM | POA: Diagnosis not present

## 2012-03-13 DIAGNOSIS — N3941 Urge incontinence: Secondary | ICD-10-CM | POA: Diagnosis not present

## 2012-03-26 DIAGNOSIS — Z950 Presence of cardiac pacemaker: Secondary | ICD-10-CM | POA: Diagnosis not present

## 2012-03-26 DIAGNOSIS — E7800 Pure hypercholesterolemia, unspecified: Secondary | ICD-10-CM | POA: Diagnosis not present

## 2012-03-26 DIAGNOSIS — I495 Sick sinus syndrome: Secondary | ICD-10-CM | POA: Diagnosis not present

## 2012-03-26 DIAGNOSIS — I10 Essential (primary) hypertension: Secondary | ICD-10-CM | POA: Diagnosis not present

## 2012-04-02 DIAGNOSIS — N3941 Urge incontinence: Secondary | ICD-10-CM | POA: Diagnosis not present

## 2012-04-05 ENCOUNTER — Emergency Department (HOSPITAL_COMMUNITY)
Admission: EM | Admit: 2012-04-05 | Discharge: 2012-04-05 | Disposition: A | Discharge status: Home / Self Care | Payer: Medicare Other | Attending: Emergency Medicine | Admitting: Emergency Medicine

## 2012-04-05 ENCOUNTER — Emergency Department (HOSPITAL_COMMUNITY): Payer: Medicare Other

## 2012-04-05 ENCOUNTER — Encounter (HOSPITAL_COMMUNITY): Payer: Self-pay

## 2012-04-05 DIAGNOSIS — I10 Essential (primary) hypertension: Secondary | ICD-10-CM | POA: Diagnosis not present

## 2012-04-05 DIAGNOSIS — J984 Other disorders of lung: Secondary | ICD-10-CM | POA: Diagnosis not present

## 2012-04-05 DIAGNOSIS — R0602 Shortness of breath: Secondary | ICD-10-CM | POA: Diagnosis not present

## 2012-04-05 DIAGNOSIS — I2510 Atherosclerotic heart disease of native coronary artery without angina pectoris: Secondary | ICD-10-CM | POA: Diagnosis not present

## 2012-04-05 LAB — CBC
HCT: 42 % (ref 36.0–46.0)
MCHC: 35.2 g/dL (ref 30.0–36.0)
Platelets: 276 10*3/uL (ref 150–400)
RDW: 13.9 % (ref 11.5–15.5)
WBC: 8.4 10*3/uL (ref 4.0–10.5)

## 2012-04-05 LAB — COMPREHENSIVE METABOLIC PANEL
ALT: 26 U/L (ref 0–35)
Alkaline Phosphatase: 89 U/L (ref 39–117)
BUN: 18 mg/dL (ref 6–23)
CO2: 22 mEq/L (ref 19–32)
Chloride: 104 mEq/L (ref 96–112)
GFR calc Af Amer: >90 mL/min (ref >90)
GFR calc non Af Amer: 83 mL/min — ABNORMAL LOW (ref >90)
Glucose, Bld: 111 mg/dL — ABNORMAL HIGH (ref 70–99)
Potassium: 4.1 mEq/L (ref 3.5–5.1)
Sodium: 138 mEq/L (ref 135–145)
Total Bilirubin: 0.3 mg/dL (ref 0.3–1.2)
Total Protein: 7.3 g/dL (ref 6.0–8.3)

## 2012-04-05 LAB — DIFFERENTIAL
Basophils Absolute: 0.1 10*3/uL (ref 0.0–0.1)
Basophils Relative: 1 % (ref 0–1)
Eosinophils Absolute: 0.4 10*3/uL (ref 0.0–0.7)
Monocytes Relative: 7 % (ref 3–12)
Neutrophils Relative %: 55 % (ref 43–77)

## 2012-04-05 MED ORDER — IOHEXOL 350 MG/ML SOLN
100.0000 mL | Freq: Once | INTRAVENOUS | Status: AC | PRN
  Administered 2012-04-05: 100 mL via INTRAVENOUS

## 2012-04-05 NOTE — ED Provider Notes (Signed)
History     CSN: [id number]  Arrival date & time 04/05/12  1047   First MD Initiated Contact with Patient 04/05/12 1138      Chief Complaint  Patient presents with  . Shortness of Breath    (Consider location/radiation/quality/duration/timing/severity/associated sxs/prior treatment) Patient is a 78 y.o. female presenting with shortness of breath. The history is provided by the patient.  Shortness of Breath  Episode onset: 3 days ago. The onset was gradual. The problem occurs frequently. The problem has been gradually worsening. The problem is mild. The symptoms are relieved by nothing. The symptoms are aggravated by activity. Associated symptoms include shortness of breath. Pertinent negatives include no chest pain, no fever and no cough. There was no intake of a foreign body. The Heimlich maneuver was not attempted. She was not exposed to toxic fumes. She has not inhaled smoke recently. She has been behaving normally.    Past Medical History  Diagnosis Date  . Diverticulosis   . Hypertension   . Prolapse of anterior vaginal wall   . History of shingles 2004 --  EYES  , NO RESIDUAL PAIN  . History of pulmonary embolism 1970'S  . Coronary artery disease CARDIOLOGIST- DR Caro Hight-  LAST VISIT THIS WEEK--  WILL REQUEST NOTE, STRESS TEST     PT DENIES S & S  . PONV (postoperative nausea and vomiting)     Past Surgical History  Procedure Date  . Cholecystectomy 1967  . Vaginal hysterectomy 1974    STILL HAS OVARIES  . Cataract extraction w/ intraocular lens  implant, bilateral   . Transthoracic echocardiogram 02-15-2012  DR SKAINS    NORMAL EF 60-65%/ MILD AORTIC SCLEROSIS, NO STENOSIS, GRADE I DIASTOLIC DYSFUNCTION  . Cardiovascular stress test 02-14-2012  DR SKAINS    LOW RISK NUCLEAR TEST/ MILD FIXED DEFECT ALONG THE DISTAL ANTERIOR SEPTAL WALL CONSTISTENT WITH LBBB/ NO ISCHEMIA   . Pubovaginal sling 02/24/2012    Procedure: Leonides Grills;  Surgeon: Kathi Ludwig, MD;   Location: [facility];  Service: Urology;  Laterality: N/A;  [employer] ANTERIOR VAULT REPAIR OWER     No family history on file.  History  Substance Use Topics  . Smoking status: Never Smoker   . Smokeless tobacco: Never Used  . Alcohol Use: Yes     RARE    OB History    Grav Para Term Preterm Abortions TAB SAB Ect Mult Living                  Review of Systems  Constitutional: Negative for fever and fatigue.  HENT: Negative for congestion, drooling and neck pain.   Eyes: Negative for pain.  Respiratory: Positive for shortness of breath. Negative for cough.   Cardiovascular: Negative for chest pain.  Gastrointestinal: Negative for nausea, vomiting, abdominal pain and diarrhea.  Genitourinary: Negative for dysuria and hematuria.  Musculoskeletal: Negative for back pain and gait problem.  Skin: Negative for color change.  Neurological: Negative for dizziness and headaches.  Hematological: Negative for adenopathy.  Psychiatric/Behavioral: Negative for behavioral problems.  All other systems reviewed and are negative.    Allergies  Sulfa antibiotics; Dilaudid; Percocet; Tramadol; Benadryl; Cephalexin; Demerol; Dimenhydrinate; Meclizine hcl; Penicillins; and Pentazocine lactate  Home Medications   Current Outpatient Rx  Name Route Sig Dispense Refill  . AMLODIPINE BESYLATE 5 MG PO TABS Oral Take 5 mg by mouth every morning.    Marland Kitchen VITAMIN C 1000 MG PO TABS Oral Take  1,000 mg by mouth daily.    . ASPIRIN 325 MG PO TBEC Oral Take 325 mg by mouth Nightly.    Marland Kitchen CALCIUM + D PO Oral Take 1 tablet by mouth daily.     . COQ-10 PO Oral Take 1 tablet by mouth daily.     Marland Kitchen FLAX SEED OIL PO Oral Take 1 capsule by mouth daily.     . FUROSEMIDE 20 MG PO TABS Oral Take 20 mg by mouth daily.    Marland Kitchen GARLIC PO Oral Take 1 tablet by mouth daily.     Lanetta Inch BI-FLEX JOINT SHIELD PO Oral Take 1 tablet by mouth daily.     Marland Kitchen ZINC PO Oral Take 1 tablet by mouth  daily.     Marland Kitchen FISH OIL PO Oral Take 1 capsule by mouth daily.     . PSYLLIUM 58.6 % PO POWD Oral Take 1 packet by mouth daily.    . MG-PLUS PROTEIN 133 MG PO TABS Oral Take 1 tablet by mouth daily.    Marland Kitchen VALACYCLOVIR HCL 1 G PO TABS Oral Take 1,000 mg by mouth daily.      BP 118/58  Pulse 91  Temp(Src) 97.9 F (36.6 C) (Oral)  Resp 18  Wt 204 lb (92.534 kg)  SpO2 96%  Physical Exam  Constitutional: She is oriented to person, place, and time. She appears well-developed and well-nourished.  HENT:  Head: Normocephalic.  Mouth/Throat: No oropharyngeal exudate.  Eyes: Conjunctivae and EOM are normal. Pupils are equal, round, and reactive to light.  Neck: Normal range of motion. Neck supple.  Cardiovascular: Normal rate, regular rhythm, normal heart sounds and intact distal pulses.  Exam reveals no gallop and no friction rub.   No murmur heard. Pulmonary/Chest: Effort normal and breath sounds normal. No respiratory distress. She has no wheezes.  Abdominal: Soft. Bowel sounds are normal. There is no tenderness.  Musculoskeletal: Normal range of motion. She exhibits no edema and no tenderness.  Neurological: She is alert and oriented to person, place, and time.  Skin: Skin is warm and dry.  Psychiatric: She has a normal mood and affect. Her behavior is normal.    ED Course  Procedures (including critical care time)   Labs Reviewed  COMPREHENSIVE METABOLIC PANEL  CBC  DIFFERENTIAL  D-DIMER, QUANTITATIVE   Dg Chest 2 View  04/05/2012  *RADIOLOGY REPORT*  Clinical Data: Shortness of breath.  CHEST - 2 VIEW  Comparison: 02/28/2012.  Findings: The pacer wires are stable.  The heart is normal in size. The mediastinal and hilar contours are normal and stable.  The lungs are clear.  No pleural effusion.  The bony thorax is intact.  IMPRESSION: No acute cardiopulmonary findings.  No change since prior study.  Original Report Authenticated By: P. Loralie Champagne, M.D.     No diagnosis  found.    Date: 04/05/2012  Rate: 89  Rhythm: Paced rhythm  QRS Axis: indeterminate  Intervals: indeterminate  ST/T Wave abnormalities: indeterminate  Conduction Disutrbances:indeterminate  Narrative Interpretation: Indeterminate due to paced rhythm  Old EKG Reviewed: unchanged   MDM  11:52 AM 78 y.o. female w hx of CAD, PE when she was 78 yo and on OCP's pw worsening exertion sob x 3 days. Pt denies cp, fever. Has had productive cough. Pt AFVSS here, appears well on exam, states her sx not cw previous PE. Pt not tachycardic, 96% on RA. Had surgery 5-6 weeks ago. Suspect pna, will get d-dimer to eval for PE.  1:18 PM D-dimer elevated, ordered CTA of chest to r/o PE.  3:20 PM: Imaging non-contrib. Pt continues to appear well on exam.  Low suspicion for ACS/heartfailure based on exam/labs/imaging. I have discussed the diagnosis/risks/treatment options with the patient and family and believe the pt to be eligible for discharge home to follow-up with pcp in 2-3 days if no better. We also discussed returning to the ED immediately if new or worsening sx occur. We discussed the sx which are most concerning (e.g., chest pain, worsening sob) that necessitate immediate return. Any new prescriptions provided to the patient are listed below.  New Prescriptions   No medications on file     Clinical Impression 1. SOB (shortness of breath) on exertion      Purvis Sheffield, MD 04/05/12 1626

## 2012-04-05 NOTE — Discharge Instructions (Signed)
Shortness of Breath Shortness of breath (dyspnea) is the feeling of uneasy breathing. Shortness of breath does not always mean that there is a life-threatening illness. However, shortness of breath requires immediate medical care. CAUSES  Causes for shortness of breath include:  Not enough oxygen in the air (as with high altitudes or with a smoke-filled room).   Short-term (acute) lung disease, including:   Infections such as pneumonia.   Fluid in the lungs, such as heart failure.   A blood clot in the lungs (pulmonary embolism).   Lasting (chronic) lung diseases.   Heart disease (heart attack, angina, heart failure, and others).   Low red blood cells (anemia).   Poor physical fitness. This can cause shortness of breath when you exercise.   Chest or back injuries or stiffness.   Being overweight (obese).   Anxiety. This can make you feel like you are not getting enough air.  DIAGNOSIS  Serious medical problems can usually be found during your physical exam. Many tests may also be done to determine why you are having shortness of breath. Tests include:  Chest X-rays.   Lung function tests.   Blood tests.   Electrocardiography.   Exercise testing.   A cardiac echo.   Imaging scans.  Your caregiver may not be able to find a cause for your shortness of breath after your exam. In this case, it is important to have a follow-up exam with your caregiver as directed.  HOME CARE INSTRUCTIONS   Do not smoke. Smoking is a common cause of shortness of breath. Ask for help to stop smoking.   Avoid being around chemicals that may bother your breathing (paint fumes, dust).   Rest as needed. Slowly resume your usual activities.   If medicines were prescribed, take them as directed for the full length of time directed. This includes oxygen and any inhaled medicines.   Follow up with your caregiver as directed. Waiting to do so or failure to follow up could result in worsening of  your condition and possible disability or death.   Be sure you understand what to do or who to call if your shortness of breath worsens.  SEEK MEDICAL CARE IF:   Your condition does not improve in the time expected.   You have a hard time doing your normal activities even with rest.   You have any side effects or problems with the medicines prescribed.   You develop any new symptoms.  SEEK IMMEDIATE MEDICAL CARE IF:   Your shortness of breath is getting worse.   You feel lightheaded, faint, or develop a cough not controlled with medicines.   You start coughing up blood.   You have pain with breathing.   You have chest pain or pain in your arms, shoulders, or abdomen.   You have a fever.   You are unable to walk up stairs or exercise the way you normally do.   Your symptoms are getting worse.  Document Released: 08/02/2001 Document Revised: 10/27/2011 Document Reviewed: 03/19/2008 Exit[employer] Patient Information 2012 [employer]. 

## 2012-04-05 NOTE — ED Notes (Signed)
Pt presents with 3 day h/o shortness of breath.  Pt reports recent bladder pinned and pacemaker placed the first of  April.  Pt reports shortness of breath, especially with exertion.  Pt reports intermittent productive cough with yellow "plugs".  Pt denies any chest discomfort, reports "a little" swelling to L lower leg.  Pt reports h/o PE with these symptoms different.

## 2012-04-06 NOTE — ED Provider Notes (Signed)
  I performed a history and physical examination of Melissa Novak and discussed her management with Dr. Romeo Apple.  I agree with the history, physical, assessment, and plan of care, with the following exceptions: None.  I have also seen the ECG and imaging studies, as well as the in-room cardiac and pulse ox monitors (and agree with the interpretations of all studies).  Case well described by Dr. Lollie Marrow, Elvis Coil, MD 04/06/12 [phone]

## 2012-04-19 ENCOUNTER — Telehealth: Payer: Self-pay | Admitting: Internal Medicine

## 2012-04-19 ENCOUNTER — Other Ambulatory Visit (HOSPITAL_COMMUNITY): Payer: Self-pay | Admitting: Geriatric Medicine

## 2012-04-19 DIAGNOSIS — R0602 Shortness of breath: Secondary | ICD-10-CM

## 2012-04-19 NOTE — Telephone Encounter (Signed)
Spoke with patient.  She is wanting to carry her groceries in from store to house.  I let her know that was fine

## 2012-04-19 NOTE — Telephone Encounter (Signed)
New msg Pt had pacemaker put in April and wanted to know how much can she carry on that side. Please call

## 2012-04-23 DIAGNOSIS — N952 Postmenopausal atrophic vaginitis: Secondary | ICD-10-CM | POA: Diagnosis not present

## 2012-04-27 ENCOUNTER — Encounter (HOSPITAL_COMMUNITY): Payer: Medicare Other

## 2012-04-30 ENCOUNTER — Encounter (HOSPITAL_COMMUNITY): Payer: Medicare Other

## 2012-05-03 ENCOUNTER — Encounter (HOSPITAL_COMMUNITY): Payer: Medicare Other

## 2012-05-08 ENCOUNTER — Ambulatory Visit (HOSPITAL_COMMUNITY)
Admission: RE | Admit: 2012-05-08 | Discharge: 2012-05-08 | Disposition: A | Discharge status: Home / Self Care | Payer: Medicare Other | Source: Ambulatory Visit | Attending: Geriatric Medicine | Admitting: Geriatric Medicine

## 2012-05-08 DIAGNOSIS — R0602 Shortness of breath: Secondary | ICD-10-CM | POA: Insufficient documentation

## 2012-05-08 MED ORDER — ALBUTEROL SULFATE (5 MG/ML) 0.5% IN NEBU
2.5000 mg | INHALATION_SOLUTION | Freq: Once | RESPIRATORY_TRACT | Status: AC
  Administered 2012-05-08: 2.5 mg via RESPIRATORY_TRACT

## 2012-05-10 DIAGNOSIS — I10 Essential (primary) hypertension: Secondary | ICD-10-CM | POA: Diagnosis not present

## 2012-05-10 DIAGNOSIS — R0602 Shortness of breath: Secondary | ICD-10-CM | POA: Diagnosis not present

## 2012-05-10 DIAGNOSIS — R21 Rash and other nonspecific skin eruption: Secondary | ICD-10-CM | POA: Diagnosis not present

## 2012-05-29 DIAGNOSIS — Z950 Presence of cardiac pacemaker: Secondary | ICD-10-CM | POA: Diagnosis not present

## 2012-05-30 DIAGNOSIS — N8111 Cystocele, midline: Secondary | ICD-10-CM | POA: Diagnosis not present

## 2012-05-30 DIAGNOSIS — N3941 Urge incontinence: Secondary | ICD-10-CM | POA: Diagnosis not present

## 2012-05-30 DIAGNOSIS — N952 Postmenopausal atrophic vaginitis: Secondary | ICD-10-CM | POA: Diagnosis not present

## 2012-05-30 DIAGNOSIS — R809 Proteinuria, unspecified: Secondary | ICD-10-CM | POA: Diagnosis not present

## 2012-05-31 ENCOUNTER — Encounter: Payer: Medicare Other | Admitting: Internal Medicine

## 2012-07-13 ENCOUNTER — Other Ambulatory Visit: Payer: Self-pay | Admitting: Geriatric Medicine

## 2012-07-13 DIAGNOSIS — Z1231 Encounter for screening mammogram for malignant neoplasm of breast: Secondary | ICD-10-CM

## 2012-07-30 DIAGNOSIS — N952 Postmenopausal atrophic vaginitis: Secondary | ICD-10-CM | POA: Diagnosis not present

## 2012-08-16 ENCOUNTER — Ambulatory Visit
Admission: RE | Admit: 2012-08-16 | Discharge: 2012-08-16 | Disposition: A | Discharge status: Home / Self Care | Payer: Medicare Other | Source: Ambulatory Visit | Attending: Geriatric Medicine | Admitting: Geriatric Medicine

## 2012-08-16 DIAGNOSIS — Z1231 Encounter for screening mammogram for malignant neoplasm of breast: Secondary | ICD-10-CM

## 2012-08-16 DIAGNOSIS — N952 Postmenopausal atrophic vaginitis: Secondary | ICD-10-CM | POA: Diagnosis not present

## 2012-08-17 ENCOUNTER — Encounter (HOSPITAL_COMMUNITY): Payer: Self-pay | Admitting: *Deleted

## 2012-08-17 ENCOUNTER — Emergency Department (HOSPITAL_COMMUNITY)
Admission: EM | Admit: 2012-08-17 | Discharge: 2012-08-18 | Disposition: A | Discharge status: Home / Self Care | Payer: Medicare Other | Attending: Emergency Medicine | Admitting: Emergency Medicine

## 2012-08-17 ENCOUNTER — Emergency Department (HOSPITAL_COMMUNITY): Payer: Medicare Other

## 2012-08-17 DIAGNOSIS — M25519 Pain in unspecified shoulder: Secondary | ICD-10-CM

## 2012-08-17 DIAGNOSIS — R209 Unspecified disturbances of skin sensation: Secondary | ICD-10-CM | POA: Insufficient documentation

## 2012-08-17 DIAGNOSIS — R0602 Shortness of breath: Secondary | ICD-10-CM | POA: Diagnosis not present

## 2012-08-17 DIAGNOSIS — Z79899 Other long term (current) drug therapy: Secondary | ICD-10-CM | POA: Insufficient documentation

## 2012-08-17 DIAGNOSIS — M542 Cervicalgia: Secondary | ICD-10-CM | POA: Diagnosis not present

## 2012-08-17 DIAGNOSIS — M79609 Pain in unspecified limb: Secondary | ICD-10-CM | POA: Insufficient documentation

## 2012-08-17 DIAGNOSIS — M8448XA Pathological fracture, other site, initial encounter for fracture: Secondary | ICD-10-CM | POA: Diagnosis not present

## 2012-08-17 DIAGNOSIS — R52 Pain, unspecified: Secondary | ICD-10-CM | POA: Diagnosis not present

## 2012-08-17 LAB — CBC WITH DIFFERENTIAL/PLATELET
Basophils Relative: 1 % (ref 0–1)
Eosinophils Absolute: 0.4 10*3/uL (ref 0.0–0.7)
HCT: 39.9 % (ref 36.0–46.0)
Lymphs Abs: 2.4 10*3/uL (ref 0.7–4.0)
MCH: 31.7 pg (ref 26.0–34.0)
MCHC: 34.8 g/dL (ref 30.0–36.0)
Monocytes Absolute: 0.8 10*3/uL (ref 0.1–1.0)
Neutro Abs: 7.2 10*3/uL (ref 1.7–7.7)
Neutrophils Relative %: 67 % (ref 43–77)
Platelets: 268 10*3/uL (ref 150–400)
RBC: 4.39 MIL/uL (ref 3.87–5.11)
WBC: 10.8 10*3/uL — ABNORMAL HIGH (ref 4.0–10.5)

## 2012-08-17 NOTE — ED Notes (Signed)
Patient transported to X-ray 

## 2012-08-17 NOTE — ED Notes (Signed)
Pt had sudden onset of pain in left shoulder that radiates through to her back, left neck and left arm numbness. Equal grips. Hx of pacemaker, LBBB. Pt took 2 ASA at home prior to EMS. 1 nitro en route without change. Current 2/10 pain

## 2012-08-18 LAB — POCT I-STAT, CHEM 8
HCT: 42 % (ref 36.0–46.0)
Hemoglobin: 14.3 g/dL (ref 12.0–15.0)
Potassium: 4.5 mEq/L (ref 3.5–5.1)
Sodium: 140 mEq/L (ref 135–145)

## 2012-08-18 NOTE — ED Provider Notes (Signed)
History     CSN: [id number]  Arrival date & time 08/17/12  2247   First MD Initiated Contact with Patient 08/17/12 2302      No chief complaint on file.   (Consider location/radiation/quality/duration/timing/severity/associated sxs/prior treatment) Patient is a 78 y.o. female presenting with shortness of breath. The history is provided by the patient. No language interpreter was used.  Shortness of Breath  The current episode started more than 2 weeks ago. The onset was gradual. The problem occurs continuously. The problem has been gradually worsening. The problem is moderate. Nothing relieves the symptoms. Nothing aggravates the symptoms. Associated symptoms include shortness of breath. Pertinent negatives include no chest pressure, no orthopnea, no fever, no cough and no wheezing. There was no intake of a foreign body. She has not inhaled smoke recently. Her past medical history does not include asthma. She has been behaving normally.  This evening had shoulder and arm pain at rest. Last 30 minutes.    Past Medical History  Diagnosis Date  . Diverticulosis   . Hypertension   . Prolapse of anterior vaginal wall   . History of shingles 2004 --  EYES  , NO RESIDUAL PAIN  . History of pulmonary embolism 1970'S  . Coronary artery disease CARDIOLOGIST- DR Caro Hight-  LAST VISIT THIS WEEK--  WILL REQUEST NOTE, STRESS TEST     PT DENIES S & S  . PONV (postoperative nausea and vomiting)     Past Surgical History  Procedure Date  . Cholecystectomy 1967  . Vaginal hysterectomy 1974    STILL HAS OVARIES  . Cataract extraction w/ intraocular lens  implant, bilateral   . Transthoracic echocardiogram 02-15-2012  DR SKAINS    NORMAL EF 60-65%/ MILD AORTIC SCLEROSIS, NO STENOSIS, GRADE I DIASTOLIC DYSFUNCTION  . Cardiovascular stress test 02-14-2012  DR SKAINS    LOW RISK NUCLEAR TEST/ MILD FIXED DEFECT ALONG THE DISTAL ANTERIOR SEPTAL WALL CONSTISTENT WITH LBBB/ NO ISCHEMIA   . Pubovaginal  sling 02/24/2012    Procedure: Leonides Grills;  Surgeon: Kathi Ludwig, MD;  [facility];  Service: Urology;  Laterality: N/A;  [employer] UPHOLD LITE ANTERIOR VAULT REPAIR OWER     No family history on file.  History  Substance Use Topics  . Smoking status: Never Smoker   . Smokeless tobacco: Never Used  . Alcohol Use: Yes     RARE    OB History    Grav Para Term Preterm Abortions TAB SAB Ect Mult Living                  Review of Systems  Constitutional: Negative for fever.  Respiratory: Positive for shortness of breath. Negative for cough and wheezing.   Cardiovascular: Negative for orthopnea.  All other systems reviewed and are negative.    Allergies  Sulfa antibiotics; Dilaudid; Percocet; Tramadol; Benadryl; Cephalexin; Demerol; Dimenhydrinate; Meclizine hcl; Penicillins; and Pentazocine lactate  Home Medications   Current Outpatient Rx  Name Route Sig Dispense Refill  . AMLODIPINE BESYLATE 5 MG PO TABS Oral Take 5 mg by mouth every morning.    Marland Kitchen VITAMIN C 1000 MG PO TABS Oral Take 1,000 mg by mouth daily.    . ASPIRIN 325 MG PO TBEC Oral Take 325 mg by mouth Nightly.    Marland Kitchen CALCIUM + D PO Oral Take 1 tablet by mouth daily.     Marland Kitchen VITAMIN D PO Oral Take 1 tablet by mouth daily.    . COQ-10  PO Oral Take 1 tablet by mouth daily.     Marland Kitchen FEMRING 0.1 MG/24HR VA RING Vaginal Place 1 each vaginally See admin instructions. Change every 90 days    . FLAX SEED OIL PO Oral Take 1 capsule by mouth daily.     . FUROSEMIDE 20 MG PO TABS Oral Take 20 mg by mouth daily.    Marland Kitchen GARLIC PO Oral Take 1 tablet by mouth daily.     Lanetta Inch BI-FLEX JOINT SHIELD PO Oral Take 1 tablet by mouth daily.     . ADULT MULTIVITAMIN W/MINERALS CH Oral Take 1 tablet by mouth daily.    Marland Kitchen ZINC PO Oral Take 1 tablet by mouth daily.     Marland Kitchen MYRBETRIQ 50 MG PO TB24 Oral Take 50 mg by mouth daily.     Marland Kitchen FISH OIL PO Oral Take 1 capsule by mouth daily.     . MG-PLUS  PROTEIN 133 MG PO TABS Oral Take 1 tablet by mouth daily.    Marland Kitchen VALACYCLOVIR HCL 1 G PO TABS Oral Take 1,000 mg by mouth daily.      BP 162/75  Temp 97.9 F (36.6 C) (Oral)  Resp 20  SpO2 95%  Physical Exam  Constitutional: She is oriented to person, place, and time. She appears well-developed and well-nourished. No distress.  HENT:  Head: Normocephalic and atraumatic.  Mouth/Throat: Oropharynx is clear and moist.  Eyes: Conjunctivae normal are normal. Pupils are equal, round, and reactive to light.  Neck: Normal range of motion. Neck supple.  Cardiovascular: Normal rate and regular rhythm.   Pulmonary/Chest: Effort normal and breath sounds normal. She has no wheezes. She has no rales.  Abdominal: Soft. There is no tenderness. There is no rebound and no guarding.  Musculoskeletal: Normal range of motion.  Neurological: She is alert and oriented to person, place, and time.  Skin: Skin is warm and dry.  Psychiatric: She has a normal mood and affect.    ED Course  Procedures (including critical care time)  Labs Reviewed  CBC WITH DIFFERENTIAL - Abnormal; Notable for the following:    WBC 10.8 (*)     All other components within normal limits   Dg Chest 2 View  08/17/2012  *RADIOLOGY REPORT*  Clinical Data: Left arm pain, numbness  CHEST - 2 VIEW  Comparison: Chest radiograph 06/20 1013  Findings: Left-sided pacemaker overlies stable cardiac silhouette. No effusion, infiltrate, pneumothorax. Degenerative osteophytosis of the thoracic spine.  There is compression fracture of the T11 vertebral body which is slightly advanced compared to prior with approximately 25% loss of to body height.  No evidence retropulsion.  IMPRESSION:  1.  No acute cardiopulmonary findings. 2.  Mild worsening of chronic compression fracture at T11.   Original Report Authenticated By: Genevive Bi, M.D.    Mm Digital Screening  08/16/2012  *RADIOLOGY REPORT*  Clinical Data: Screening.  DIGITAL BILATERAL  SCREENING MAMMOGRAM WITH CAD  Comparison: Previous exams.  Findings: The breast tissue is almost entirely fatty. No suspicious masses, architectural distortion, or calcifications are present.  Images were processed with CAD.  IMPRESSION: No mammographic evidence of malignancy.  A result letter of this screening mammogram will be mailed directly to the patient.  RECOMMENDATION: Screening mammogram in one year. (Code:SM-B-01Y)  BI-RADS CATEGORY 1:  Negative.   Original Report Authenticated By: Rosendo Gros, M.D.      No diagnosis found.    MDM   Date: 08/18/2012  Rate: 97  Rhythm: normal  sinus rhythm  QRS Axis: left  Intervals: PR prolonged  ST/T Wave abnormalities: normal  Conduction Disutrbances:first-degree A-V block   Narrative Interpretation:   Old EKG Reviewed: none available        Eloped during work up  Axelle Szwed Smitty Cords, MD 08/18/12 1610

## 2012-08-18 NOTE — ED Notes (Signed)
The pt signed out ama and will call or see her doctor later today

## 2012-08-18 NOTE — ED Notes (Signed)
The pt signed out ama

## 2012-08-18 NOTE — ED Notes (Signed)
The pt came out of her room and wants her iv removed she is tired of waiting and she does not know the results of her xray and she will not wait any longer.  Calling her daughter to pick her up

## 2012-09-03 ENCOUNTER — Ambulatory Visit: Payer: Medicare Other

## 2012-09-03 DIAGNOSIS — Z950 Presence of cardiac pacemaker: Secondary | ICD-10-CM | POA: Diagnosis not present

## 2012-09-06 ENCOUNTER — Ambulatory Visit: Payer: Medicare Other

## 2012-09-09 DIAGNOSIS — Z23 Encounter for immunization: Secondary | ICD-10-CM | POA: Diagnosis not present

## 2012-10-01 DIAGNOSIS — R0609 Other forms of dyspnea: Secondary | ICD-10-CM | POA: Diagnosis not present

## 2012-10-01 DIAGNOSIS — I495 Sick sinus syndrome: Secondary | ICD-10-CM | POA: Diagnosis not present

## 2012-10-01 DIAGNOSIS — Z950 Presence of cardiac pacemaker: Secondary | ICD-10-CM | POA: Diagnosis not present

## 2012-10-01 DIAGNOSIS — E669 Obesity, unspecified: Secondary | ICD-10-CM | POA: Diagnosis not present

## 2012-10-01 DIAGNOSIS — R0989 Other specified symptoms and signs involving the circulatory and respiratory systems: Secondary | ICD-10-CM | POA: Diagnosis not present

## 2012-10-01 DIAGNOSIS — I10 Essential (primary) hypertension: Secondary | ICD-10-CM | POA: Diagnosis not present

## 2012-10-04 DIAGNOSIS — J45909 Unspecified asthma, uncomplicated: Secondary | ICD-10-CM | POA: Diagnosis not present

## 2012-10-25 DIAGNOSIS — N952 Postmenopausal atrophic vaginitis: Secondary | ICD-10-CM | POA: Diagnosis not present

## 2012-10-30 DIAGNOSIS — Z79899 Other long term (current) drug therapy: Secondary | ICD-10-CM | POA: Diagnosis not present

## 2012-10-30 DIAGNOSIS — J40 Bronchitis, not specified as acute or chronic: Secondary | ICD-10-CM | POA: Diagnosis not present

## 2012-10-30 DIAGNOSIS — I10 Essential (primary) hypertension: Secondary | ICD-10-CM | POA: Diagnosis not present

## 2012-11-06 DIAGNOSIS — N393 Stress incontinence (female) (male): Secondary | ICD-10-CM | POA: Diagnosis not present

## 2012-11-07 ENCOUNTER — Other Ambulatory Visit: Payer: Self-pay | Admitting: Urology

## 2012-12-04 ENCOUNTER — Encounter (HOSPITAL_BASED_OUTPATIENT_CLINIC_OR_DEPARTMENT_OTHER): Payer: Self-pay | Admitting: *Deleted

## 2012-12-04 NOTE — Progress Notes (Signed)
Pt instructed npo p mn 1/16 x norvasc, valtrex w sip of water.  To [facility] 1/17 @ 0730.  Needs istat on arrival.  Ekg,cxr, in epic.  Requested last office visit note, most recent studies from Dr. Anne Fu office, along w pacer information.  Pt to come Thursday 1/15 for CBC.

## 2012-12-06 DIAGNOSIS — Z7982 Long term (current) use of aspirin: Secondary | ICD-10-CM | POA: Diagnosis not present

## 2012-12-06 DIAGNOSIS — Z79899 Other long term (current) drug therapy: Secondary | ICD-10-CM | POA: Diagnosis not present

## 2012-12-06 DIAGNOSIS — I10 Essential (primary) hypertension: Secondary | ICD-10-CM | POA: Diagnosis not present

## 2012-12-06 DIAGNOSIS — Z86711 Personal history of pulmonary embolism: Secondary | ICD-10-CM | POA: Diagnosis not present

## 2012-12-06 DIAGNOSIS — N3946 Mixed incontinence: Secondary | ICD-10-CM | POA: Diagnosis not present

## 2012-12-06 LAB — CBC
Hemoglobin: 15.2 g/dL — ABNORMAL HIGH (ref 12.0–15.0)
MCV: 91.5 fL (ref 78.0–100.0)
Platelets: 383 10*3/uL (ref 150–400)
RBC: 4.85 MIL/uL (ref 3.87–5.11)
WBC: 10.1 10*3/uL (ref 4.0–10.5)

## 2012-12-07 ENCOUNTER — Ambulatory Visit (HOSPITAL_BASED_OUTPATIENT_CLINIC_OR_DEPARTMENT_OTHER)
Admission: RE | Admit: 2012-12-07 | Discharge: 2012-12-07 | Disposition: A | Discharge status: Home / Self Care | Payer: Medicare Other | Source: Ambulatory Visit | Attending: Urology | Admitting: Urology

## 2012-12-07 ENCOUNTER — Encounter (HOSPITAL_BASED_OUTPATIENT_CLINIC_OR_DEPARTMENT_OTHER)
Admission: RE | Disposition: A | Discharge status: Home / Self Care | Payer: Self-pay | Source: Ambulatory Visit | Attending: Urology

## 2012-12-07 ENCOUNTER — Encounter (HOSPITAL_BASED_OUTPATIENT_CLINIC_OR_DEPARTMENT_OTHER): Payer: Self-pay | Admitting: *Deleted

## 2012-12-07 ENCOUNTER — Encounter (HOSPITAL_BASED_OUTPATIENT_CLINIC_OR_DEPARTMENT_OTHER): Payer: Self-pay | Admitting: Anesthesiology

## 2012-12-07 ENCOUNTER — Ambulatory Visit (HOSPITAL_BASED_OUTPATIENT_CLINIC_OR_DEPARTMENT_OTHER): Payer: Medicare Other | Admitting: Anesthesiology

## 2012-12-07 DIAGNOSIS — N393 Stress incontinence (female) (male): Secondary | ICD-10-CM

## 2012-12-07 DIAGNOSIS — Z79899 Other long term (current) drug therapy: Secondary | ICD-10-CM | POA: Insufficient documentation

## 2012-12-07 DIAGNOSIS — I10 Essential (primary) hypertension: Secondary | ICD-10-CM | POA: Diagnosis not present

## 2012-12-07 DIAGNOSIS — N3946 Mixed incontinence: Secondary | ICD-10-CM | POA: Diagnosis not present

## 2012-12-07 DIAGNOSIS — Z86711 Personal history of pulmonary embolism: Secondary | ICD-10-CM | POA: Diagnosis not present

## 2012-12-07 DIAGNOSIS — Z7982 Long term (current) use of aspirin: Secondary | ICD-10-CM | POA: Insufficient documentation

## 2012-12-07 HISTORY — DX: Cough: R05

## 2012-12-07 HISTORY — PX: PUBOVAGINAL SLING: SHX1035

## 2012-12-07 HISTORY — DX: Cough, unspecified: R05.9

## 2012-12-07 HISTORY — DX: Presence of cardiac pacemaker: Z95.0

## 2012-12-07 LAB — POCT I-STAT 4, (NA,K, GLUC, HGB,HCT)
HCT: 43 % (ref 36.0–46.0)
Hemoglobin: 14.6 g/dL (ref 12.0–15.0)
Potassium: 4.1 mEq/L (ref 3.5–5.1)
Sodium: 142 mEq/L (ref 135–145)

## 2012-12-07 SURGERY — CREATION, PUBOVAGINAL SLING
Anesthesia: General | Site: Bladder | Wound class: Clean Contaminated

## 2012-12-07 MED ORDER — DEXAMETHASONE SODIUM PHOSPHATE 4 MG/ML IJ SOLN
INTRAMUSCULAR | Status: DC | PRN
  Administered 2012-12-07: 10 mg via INTRAVENOUS

## 2012-12-07 MED ORDER — LACTATED RINGERS IV SOLN
INTRAVENOUS | Status: DC | PRN
  Administered 2012-12-07 (×2): via INTRAVENOUS

## 2012-12-07 MED ORDER — CIPROFLOXACIN IN D5W 400 MG/200ML IV SOLN
400.0000 mg | INTRAVENOUS | Status: AC
  Administered 2012-12-07: 400 mg via INTRAVENOUS
  Filled 2012-12-07: qty 200

## 2012-12-07 MED ORDER — PROPOFOL 10 MG/ML IV BOLUS
INTRAVENOUS | Status: DC | PRN
  Administered 2012-12-07: 160 mg via INTRAVENOUS

## 2012-12-07 MED ORDER — MIDAZOLAM HCL 5 MG/5ML IJ SOLN
INTRAMUSCULAR | Status: DC | PRN
  Administered 2012-12-07: 2 mg via INTRAVENOUS

## 2012-12-07 MED ORDER — SODIUM CHLORIDE 0.9 % IR SOLN
Status: DC | PRN
  Administered 2012-12-07: 09:00:00

## 2012-12-07 MED ORDER — BUPIVACAINE-EPINEPHRINE 0.5% -1:200000 IJ SOLN
INTRAMUSCULAR | Status: DC | PRN
  Administered 2012-12-07: 7 mL

## 2012-12-07 MED ORDER — KETOROLAC TROMETHAMINE 30 MG/ML IJ SOLN
INTRAMUSCULAR | Status: DC | PRN
  Administered 2012-12-07: 30 mg via INTRAVENOUS

## 2012-12-07 MED ORDER — LACTATED RINGERS IV SOLN
INTRAVENOUS | Status: DC
  Administered 2012-12-07: 08:00:00 via INTRAVENOUS
  Filled 2012-12-07: qty 1000

## 2012-12-07 MED ORDER — METOCLOPRAMIDE HCL 5 MG/ML IJ SOLN
10.0000 mg | Freq: Once | INTRAMUSCULAR | Status: AC | PRN
  Administered 2012-12-07: 5 mg via INTRAVENOUS
  Filled 2012-12-07: qty 2

## 2012-12-07 MED ORDER — ONDANSETRON HCL 4 MG/2ML IJ SOLN
INTRAMUSCULAR | Status: DC | PRN
  Administered 2012-12-07: 4 mg via INTRAVENOUS

## 2012-12-07 MED ORDER — LIDOCAINE HCL (CARDIAC) 20 MG/ML IV SOLN
INTRAVENOUS | Status: DC | PRN
  Administered 2012-12-07: 75 mg via INTRAVENOUS

## 2012-12-07 MED ORDER — FENTANYL CITRATE 0.05 MG/ML IJ SOLN
INTRAMUSCULAR | Status: DC | PRN
  Administered 2012-12-07: 50 ug via INTRAVENOUS
  Administered 2012-12-07 (×4): 25 ug via INTRAVENOUS

## 2012-12-07 MED ORDER — TAPENTADOL HCL 100 MG PO TABS: 100.0000 mg | ORAL_TABLET | Freq: Four times a day (QID) | ORAL | Status: DC | PRN

## 2012-12-07 MED ORDER — LACTATED RINGERS IV SOLN
INTRAVENOUS | Status: DC
  Filled 2012-12-07: qty 1000

## 2012-12-07 SURGICAL SUPPLY — 59 items
BAG URINE DRAINAGE (UROLOGICAL SUPPLIES) ×2 IMPLANT
BLADE SURG 10 STRL SS (BLADE) ×2 IMPLANT
BLADE SURG 15 STRL LF DISP TIS (BLADE) ×1 IMPLANT
BLADE SURG 15 STRL SS (BLADE) ×1
BLADE SURG ROTATE 9660 (MISCELLANEOUS) ×2 IMPLANT
BOOTIES KNEE HIGH SLOAN (MISCELLANEOUS) ×2 IMPLANT
CANISTER SUCTION 1200CC (MISCELLANEOUS) IMPLANT
CANISTER SUCTION 2500CC (MISCELLANEOUS) ×2 IMPLANT
CATH FOLEY 2WAY SLVR  5CC 16FR (CATHETERS) ×1
CATH FOLEY 2WAY SLVR 5CC 16FR (CATHETERS) ×1 IMPLANT
CLOTH BEACON ORANGE TIMEOUT ST (SAFETY) ×2 IMPLANT
COVER MAYO STAND STRL (DRAPES) ×2 IMPLANT
COVER TABLE BACK 60X90 (DRAPES) ×2 IMPLANT
DERMABOND ADVANCED (GAUZE/BANDAGES/DRESSINGS)
DERMABOND ADVANCED .7 DNX12 (GAUZE/BANDAGES/DRESSINGS) IMPLANT
DEVICE CAPIO SLIM BOX (INSTRUMENTS) IMPLANT
DISSECTOR ROUND CHERRY 3/8 STR (MISCELLANEOUS) IMPLANT
DRAPE CAMERA CLOSED 9X96 (DRAPES) ×2 IMPLANT
DRAPE UNDERBUTTOCKS STRL (DRAPE) ×2 IMPLANT
FLOSEAL 10ML (HEMOSTASIS) IMPLANT
GAUZE SPONGE 4X4 16PLY XRAY LF (GAUZE/BANDAGES/DRESSINGS) IMPLANT
GLOVE BIO SURGEON STRL SZ7.5 (GLOVE) ×2 IMPLANT
GLOVE BIO SURGEON STRL SZ8.5 (GLOVE) ×2 IMPLANT
GOWN PREVENTION PLUS LG XLONG (DISPOSABLE) ×2 IMPLANT
GOWN STRL REIN 2XL LVL4 (GOWN DISPOSABLE) ×2 IMPLANT
GOWN STRL REIN XL XLG (GOWN DISPOSABLE) ×2 IMPLANT
HOOK RETRACTION 12 ELAST STAY (MISCELLANEOUS) IMPLANT
NEEDLE 1/2 CIR CATGUT .05X1.09 (NEEDLE) IMPLANT
NEEDLE HYPO 22GX1.5 SAFETY (NEEDLE) ×4 IMPLANT
PACKING VAGINAL (PACKING) ×2 IMPLANT
PENCIL BUTTON HOLSTER BLD 10FT (ELECTRODE) ×2 IMPLANT
PLUG CATH AND CAP STER (CATHETERS) ×2 IMPLANT
RETRACTOR LONRSTAR 16.6X16.6CM (MISCELLANEOUS) ×1 IMPLANT
RETRACTOR STAY HOOK 5MM (MISCELLANEOUS) ×2 IMPLANT
RETRACTOR STER APS 16.6X16.6CM (MISCELLANEOUS) ×2
SET IRRIG Y TYPE TUR BLADDER L (SET/KITS/TRAYS/PACK) ×2 IMPLANT
SHEET LAVH (DRAPES) ×2 IMPLANT
SLING SOLYX SYSTEM SIS BX (SLING) IMPLANT
SPONGE LAP 4X18 X RAY DECT (DISPOSABLE) ×2 IMPLANT
SUCTION FRAZIER TIP 10 FR DISP (SUCTIONS) ×2 IMPLANT
SUT ABS MONO DBL WITH NDL 48IN (SUTURE) IMPLANT
SUT ETHILON 2 0 PS N (SUTURE) IMPLANT
SUT MON AB 2-0 SH 27 (SUTURE)
SUT MON AB 2-0 SH27 (SUTURE) IMPLANT
SUT NONABSORB MONO DB W/NDL 48 (SUTURE) IMPLANT
SUT PDS AB 3-0 SH 27 (SUTURE) IMPLANT
SUT SILK 3 0 PS 1 (SUTURE) ×2 IMPLANT
SUT VIC AB 0 CT1 36 (SUTURE) IMPLANT
SUT VIC AB 2-0 CT1 27 (SUTURE)
SUT VIC AB 2-0 CT1 TAPERPNT 27 (SUTURE) IMPLANT
SUT VIC AB 2-0 UR6 27 (SUTURE) ×2 IMPLANT
SYR BULB IRRIGATION 50ML (SYRINGE) ×2 IMPLANT
SYR CONTROL 10ML LL (SYRINGE) ×2 IMPLANT
SYRINGE 10CC LL (SYRINGE) ×2 IMPLANT
TRAY DSU PREP LF (CUSTOM PROCEDURE TRAY) ×2 IMPLANT
TUBE CONNECTING 12X1/4 (SUCTIONS) ×4 IMPLANT
WATER STERILE IRR 500ML POUR (IV SOLUTION) ×4 IMPLANT
YANKAUER SUCT BULB TIP NO VENT (SUCTIONS) IMPLANT
solyx sling ×2 IMPLANT

## 2012-12-07 NOTE — Anesthesia Postprocedure Evaluation (Signed)
  Anesthesia Post-op Note  Patient: Melissa Novak  Procedure(s) Performed: Procedure(s) (LRB): PUBO-VAGINAL SLING (N/A)  Patient Location: PACU  Anesthesia Type: General  Level of Consciousness: awake and alert   Airway and Oxygen Therapy: Patient Spontanous Breathing  Post-op Pain: mild  Post-op Assessment: Post-op Vital signs reviewed, Patient's Cardiovascular Status Stable, Respiratory Function Stable, Patent Airway and No signs of Nausea or vomiting  Last Vitals:  Filed Vitals:   12/07/12 1149  BP: 143/71  Pulse: 80  Temp: 36 C  Resp: 18    Post-op Vital Signs: stable   Complications: No apparent anesthesia complications

## 2012-12-07 NOTE — Op Note (Signed)
Pre-operative diagnosis :  Stress Urinary Incontinence  Postoperative diagnosis:  Same  Operation:  Implantation of Solyx sling  Surgeon:  S. Patsi Sears, MD  First assistant:  Sanjuana Kava, PA-S2  Anesthesia:  general  Preparation:  After appropriate pre anesthesia, the patient was brought to the OR and placed upon the OR table in the supine position where General LMA anesthesia was introduced. Armband was double-checked. She was replaced in the dorsal lithotomy position, and the pubis and vagina were prepped with betadine solution and draped in the usual fashion.   Review history:  79 y/o white female returns today for a 4 mo f/u with a hx of urinary incontinence. She last had a Femring placed on 10/25/12 for hx of atrophic vaginitis. Currently taking Myrbetriq 50mg  for incontinence. S/p Uphold Light sacrospinous anterior vault repair 02/24/12. Bowels are improved on metamucil. She continues to have terrible issues with incontinence.  She was originally referred by Dr. Pete Glatter for further evaluation of a cystocele. Her bladder "fell out" 2 weeks ago, and she "pushed it back". She has frequency, dysuria, difficulty postponing urination and incontinence. Para 3-3-0. Tobacco: 15 yrs x 1/2 ppd. No constipation. TAH x 40 yrs ago.( Dr. Laural Benes). She is not sexually active (husband died of CaP).  Hx of anterior vaginal wall prolapse, with bladder " falling out". She must push bladder back internally. She has frequency, dysuria, difficulty postponing urine, and incontinence. Para 3-3-0. No constipation. Hx of TAH 40 yrs ago. On Exam She had a cystocele, grade IV ( now with Stage IV anterior vault prolapse, with POPQ +4 Ap.) She also had urethral hypermobility and vaginal atrophy. Videourodynamics shows sensory urgency, wht 1st sensation at 54cc, and with small bladder capacity of 300cc. She did not leak with her cystocele reduced, consistant with her history. When her bladder is infected, she has urge and urge  incontinence. She is able to void with low detrussor pressure.      Statement of  Likelihood of Success: Excellent. TIME-OUT observed.:  Procedure:  The foley catheter was placed, and the bladder neck and midline of the urethra was marked with a marking pen. The previously placed Femring was removed for the duration of the case.     8cc of Marcaine with epinephrine, 1: 200,000 was injected into the peri-urethral space. A 2cm midline incision was then made, and dissection was accomplished in the periurethral tissue to the level of the pubis. The SOLYX sling was prepared with the midline marked with a blue pen, and the right side was placed across the midline of the sling  into the muscle under the pubis. The L side was also placed in similar fashion, and antibiotic irrigation was accomplished and the sling was examined. " Pillowing" effect was noted. Minimal bleeding from the vaginal edge was noted.   Closure was affected with 2-0 Vicryl suture in a single layer. The Femring was replaced, and cystoscopy was accomplished, and showed normal bladder bade, normal urethra, with no evidence of sling trauma, or erosion. There was no bladder stone, tumor, or diverticular formation. The bladder was drained and the patient was given IV Toradol, awakened and taken to the PAR in good condition.

## 2012-12-07 NOTE — H&P (Signed)
cc: Dr. Merlene Laughter   Reason For Visit  4 mo f/u   Active Problems Problems  1. Postmenopausal Atrophic Vaginitis 627.3 2. Urge Incontinence Of Urine 788.31  History of Present Illness         79 y/o white female returns today for a 4 mo f/u with a hx of urinary incontinence.  She last had a Femring placed on 10/25/12 for hx of atrophic vaginitis.  Currently taking Myrbetriq 50mg  for incontinence.  S/p Uphold Light sacrospinous anterior vault repair 02/24/12.  Bowels are improved on metamucil.  She continues to have terrible issues with incontinence.    She was originally referred by Dr. Pete Glatter for further evaluation of a cystocele. Her bladder "fell out" 2 weeks ago, and she "pushed it back".  She has frequency, dysuria, difficulty postponing urination and incontinence. Para 3-3-0. Tobacco: 15 yrs x 1/2 ppd.  No constipation. TAH x 40 yrs ago.( Dr. Laural Benes). She is not sexually active (husband died of CaP).      Hx of anterior vaginal wall prolapse, with  bladder " falling out". She must push bladder back internally. She has frequency, dysuria, difficulty postponing urine, and incontinence. Para 3-3-0. No constipation. Hx of TAH 40 yrs ago. On Exam She had a cystocele, grade IV ( now with Stage IV anterior vault prolapse, with POPQ +4 Ap.) She also had urethral hypermobility and vaginal atrophy. Videourodynamics shows sensory urgency, wht 1st sensation at 54cc, and with small bladder capacity of 300cc. She did not leak with her cystocele reduced, consistant with her history. When her bladder is infected, she has urge and urge incontinence. She is able to void with low detrussor pressure.   Past Medical History Problems  1. History of  Hypertension 401.9 2. History of  Pulmonary Embolism V12.55 3. History of  Thrombophlebitis Of Deep Vessels Of The Lower Extremity V12.51  Surgical History Problems  1. History of  Anterior Colporrhaphy, Repair Of Cystocele 2. History of   Cholecystectomy 3. History of  Hysterectomy V45.77 4. History of  Sacrospinous Ligament Fixation For Posthysterectomy Prolapse 5. History of  Vaginal Surg Insertion Of Mesh For Pelvic Floor Repair  Current Meds 1. AmLODIPine Besylate 5 MG Oral Tablet; Therapy: (Recorded:07Dec2012) to 2. Aspirin 325 MG Oral Tablet; Therapy: (Recorded:21Dec2012) to 3. Calcium 600 + D TABS; Therapy: (Recorded:21Dec2012) to 4. Co Q 10 CAPS; Therapy: (Recorded:21Dec2012) to 5. Fish Oil CAPS; Therapy: (Recorded:21Dec2012) to 6. Flax Seed Oil CAPS; Therapy: (Recorded:21Dec2012) to 7. Furosemide 20 MG Oral Tablet; Therapy: (Recorded:07Dec2012) to 8. Garlic CAPS; Therapy: (Recorded:21Dec2012) to 9. Glucosamine CAPS; Therapy: (Recorded:21Dec2012) to 10. Magnesium CAPS; Therapy: (Recorded:21Dec2012) to 11. Myrbetriq 50 MG Oral Tablet Extended Release 24 Hour; Take 1 tablet every day; Therapy:   21Jun2013 to (Evaluate:18Dec2013); Last Rx:21Jun2013 12. ValACYclovir HCl 1 GM Oral Tablet; Therapy: 31May2012 to 13. Valtrex 1 GM Oral Tablet; Therapy: (Recorded:10Jul2013) to 14. Vitamin C TABS; Therapy: (Recorded:10Jul2013) to 15. Vitamin D CAPS; Therapy: (Recorded:21Dec2012) to  Allergies Medication  1. Sulfa Drugs 2. Codeine Derivatives 3. Demerol TABS 4. Dramamine TABS 5. Keflex TABS 6. Penicillins 7. TraMADol HCl TABS  Family History Problems  1. Family history of  Family Health Status Number Of Children 1 son, 2 daughters 2. Family history of  Father Deceased At Age 75 leukemia 3. Paternal history of  Leukemia V16.6 4. Maternal history of  Lung Cancer V16.1 5. Family history of  Mother Deceased At Age 59 lung cancer  Social History Problems  1. Alcohol Use Rare  2. Caffeine Use 2-3 per day 3. Former Smoker V15.82 4. Marital History - Widowed  Review of Systems Constitutional, skin, eye, otolaryngeal, hematologic/lymphatic, cardiovascular, pulmonary, endocrine, musculoskeletal,  gastrointestinal, neurological and psychiatric system(s) were reviewed and pertinent findings if present are noted.  Genitourinary: urinary frequency, urinary urgency, dysuria and incontinence and difficulty postponing urination.    Vitals Vital Signs [Data Includes: Last 1 Day]  17Dec2013 11:28AM  Blood Pressure: 146 / 77 Temperature: 97.8 F Heart Rate: 108  Physical Exam Genitourinary:  Chaperone Present: brandy. The urethra is not tender. Urethral hypermobility is present. There is no urethral discharge. Vaginal exam demonstrates atrophy and the vaginal epithelium to be poorly estrogenized, but no abnormalities. No cystocele is identified. No enterocele is identified. No rectocele is identified. The cervix is is absent. The uterus is absent. The bladder is normal on palpation. The anus is normal on inspection.    Results/Data Urine [Data Includes: Last 1 Day]   17Dec2013  COLOR YELLOW   APPEARANCE CLEAR   SPECIFIC GRAVITY 1.020   pH 6.0   GLUCOSE NEG mg/dL  BILIRUBIN NEG   KETONE NEG mg/dL  BLOOD NEG   PROTEIN NEG mg/dL  UROBILINOGEN 0.2 mg/dL  NITRITE NEG   LEUKOCYTE ESTERASE NEG   Selected Results  UA With REFLEX 17Dec2013 10:49AM [name]   Test Name Result Flag Reference  COLOR YELLOW  YELLOW  APPEARANCE CLEAR  CLEAR  SPECIFIC GRAVITY 1.020  1.005-1.030  pH 6.0  5.0-8.0  GLUCOSE NEG mg/dL  NEG  BILIRUBIN NEG  NEG  KETONE NEG mg/dL  NEG  BLOOD NEG  NEG  PROTEIN NEG mg/dL  NEG  UROBILINOGEN 0.2 mg/dL  1.6-1.0  NITRITE NEG  NEG  LEUKOCYTE ESTERASE NEG  NEG   Procedure  + Marshall test and + Q-tip test.     Assessment Assessed  1. Female Stress Incontinence 625.6         Mixed stress and urge incontinence. She is post anterior vault repair. She is on Myrbetriq-hasn't made any difference. Vaginal  estrogen x 6 months ( sore breasts gone).    We have discussed her + Marshall test and + Q-tip test, and options for surgery. We have considered Blavais  sling, [place], and trans-obturator slings, but she may be the best fit for a Solyx sling, because she is sedentary, and because it can be an outpatient surgery with minimal anesthesia. She may be interested in  research study for solyx sling.   Plan Female Stress Incontinence (625.6)  1. Follow-up Schedule Surgery Office  Follow-up  Requested for: 17Dec2013 Urinary Incontinence (788.30), Female Stress Incontinence (625.6)  2. Gaynell Face Test  Requested for: 17Dec2013   Best candidate for Solyx sling as outpatient.   Signatures Electronically signed by : [name], M.D.; Nov 06 2012 12:51PM

## 2012-12-07 NOTE — Transfer of Care (Signed)
Immediate Anesthesia Transfer of Care Note  Patient: Melissa Novak  Procedure(s) Performed: Procedure(s) (LRB): PUBO-VAGINAL SLING (N/A)  Patient Location: PACU  Anesthesia Type: General  Level of Consciousness: awake, sedated, patient cooperative and responds to stimulation  Airway & Oxygen Therapy: Patient Spontanous Breathing and Patient connected to face mask oxygen  Post-op Assessment: Report given to PACU RN, Post -op Vital signs reviewed and stable and Patient moving all extremities  Post vital signs: Reviewed and stable  Complications: No apparent anesthesia complications

## 2012-12-07 NOTE — Interval H&P Note (Signed)
History and Physical Interval Note:  12/07/2012 8:52 AM  Melissa Novak  has presented today for surgery, with the diagnosis of stress incontienence   The various methods of treatment have been discussed with the patient and family. After consideration of risks, benefits and other options for treatment, the patient has consented to  Procedure(s) (LRB) with comments: PUBO-VAGINAL SLING (N/A) - solyx sling  as a surgical intervention .  The patient's history has been reviewed, patient examined, no change in status, stable for surgery.  I have reviewed the patient's chart and labs.  Questions were answered to the patient's satisfaction.     [name] I

## 2012-12-07 NOTE — Anesthesia Preprocedure Evaluation (Signed)
Anesthesia Evaluation  Patient identified by MRN, date of birth, ID band Patient awake    Reviewed: Allergy & Precautions, H&P , NPO status , Patient's Chart, lab work & pertinent test results  History of Anesthesia Complications (+) PONV  Airway Mallampati: II TM Distance: >3 FB Neck ROM: Full    Dental  (+) Teeth Intact and Dental Advisory Given   Pulmonary neg pulmonary ROS,  breath sounds clear to auscultation  Pulmonary exam normal       Cardiovascular hypertension, - angina+ CAD + dysrhythmias + pacemaker ( DDD St Jude) Rhythm:Regular Rate:Normal  History of PE in remote past   Neuro/Psych Anxiety negative neurological ROS  negative psych ROS   GI/Hepatic negative GI ROS, Neg liver ROS,   Endo/Other  negative endocrine ROSMorbid obesity  Renal/GU negative Renal ROS  negative genitourinary   Musculoskeletal negative musculoskeletal ROS (+)   Abdominal   Peds  Hematology negative hematology ROS (+)   Anesthesia Other Findings   Reproductive/Obstetrics negative OB ROS                           Anesthesia Physical  Anesthesia Plan  ASA: III  Anesthesia Plan: General   Post-op Pain Management:    Induction: Intravenous  Airway Management Planned: LMA and Oral ETT  Additional Equipment:   Intra-op Plan:   Post-operative Plan:   Informed Consent: I have reviewed the patients History and Physical, chart, labs and discussed the procedure including the risks, benefits and alternatives for the proposed anesthesia with the patient or authorized representative who has indicated his/her understanding and acceptance.   Dental advisory given  Plan Discussed with: CRNA  Anesthesia Plan Comments:         Anesthesia Quick Evaluation

## 2012-12-10 ENCOUNTER — Encounter (HOSPITAL_BASED_OUTPATIENT_CLINIC_OR_DEPARTMENT_OTHER): Payer: Self-pay | Admitting: Urology

## 2012-12-10 DIAGNOSIS — R351 Nocturia: Secondary | ICD-10-CM | POA: Diagnosis not present

## 2012-12-10 DIAGNOSIS — R350 Frequency of micturition: Secondary | ICD-10-CM | POA: Diagnosis not present

## 2012-12-10 DIAGNOSIS — N393 Stress incontinence (female) (male): Secondary | ICD-10-CM | POA: Diagnosis not present

## 2013-01-29 DIAGNOSIS — Z950 Presence of cardiac pacemaker: Secondary | ICD-10-CM | POA: Diagnosis not present

## 2013-01-29 DIAGNOSIS — I495 Sick sinus syndrome: Secondary | ICD-10-CM | POA: Diagnosis not present

## 2013-02-21 DIAGNOSIS — H52209 Unspecified astigmatism, unspecified eye: Secondary | ICD-10-CM | POA: Diagnosis not present

## 2013-02-21 DIAGNOSIS — Z961 Presence of intraocular lens: Secondary | ICD-10-CM | POA: Diagnosis not present

## 2013-03-15 DIAGNOSIS — N393 Stress incontinence (female) (male): Secondary | ICD-10-CM | POA: Diagnosis not present

## 2013-03-15 DIAGNOSIS — N8111 Cystocele, midline: Secondary | ICD-10-CM | POA: Diagnosis not present

## 2013-03-15 DIAGNOSIS — R32 Unspecified urinary incontinence: Secondary | ICD-10-CM | POA: Diagnosis not present

## 2013-04-05 DIAGNOSIS — Z950 Presence of cardiac pacemaker: Secondary | ICD-10-CM | POA: Diagnosis not present

## 2013-04-05 DIAGNOSIS — E7800 Pure hypercholesterolemia, unspecified: Secondary | ICD-10-CM | POA: Diagnosis not present

## 2013-04-05 DIAGNOSIS — I10 Essential (primary) hypertension: Secondary | ICD-10-CM | POA: Diagnosis not present

## 2013-05-02 DIAGNOSIS — N952 Postmenopausal atrophic vaginitis: Secondary | ICD-10-CM | POA: Diagnosis not present

## 2013-05-16 DIAGNOSIS — Z79899 Other long term (current) drug therapy: Secondary | ICD-10-CM | POA: Diagnosis not present

## 2013-05-16 DIAGNOSIS — I10 Essential (primary) hypertension: Secondary | ICD-10-CM | POA: Diagnosis not present

## 2013-05-16 DIAGNOSIS — Z1331 Encounter for screening for depression: Secondary | ICD-10-CM | POA: Diagnosis not present

## 2013-05-16 DIAGNOSIS — G479 Sleep disorder, unspecified: Secondary | ICD-10-CM | POA: Diagnosis not present

## 2013-05-16 DIAGNOSIS — Z0000 Encounter for general adult medical examination without abnormal findings: Secondary | ICD-10-CM | POA: Diagnosis not present

## 2013-06-05 DIAGNOSIS — D2310 Other benign neoplasm of skin of unspecified eyelid, including canthus: Secondary | ICD-10-CM | POA: Diagnosis not present

## 2013-07-09 DIAGNOSIS — L739 Follicular disorder, unspecified: Secondary | ICD-10-CM | POA: Diagnosis not present

## 2013-07-09 DIAGNOSIS — L821 Other seborrheic keratosis: Secondary | ICD-10-CM | POA: Diagnosis not present

## 2013-07-09 DIAGNOSIS — L608 Other nail disorders: Secondary | ICD-10-CM | POA: Diagnosis not present

## 2013-08-08 DIAGNOSIS — N952 Postmenopausal atrophic vaginitis: Secondary | ICD-10-CM | POA: Diagnosis not present

## 2013-08-22 DIAGNOSIS — Z23 Encounter for immunization: Secondary | ICD-10-CM | POA: Diagnosis not present

## 2013-08-23 ENCOUNTER — Ambulatory Visit (INDEPENDENT_AMBULATORY_CARE_PROVIDER_SITE_OTHER): Payer: Medicare Other | Admitting: *Deleted

## 2013-08-23 DIAGNOSIS — R001 Bradycardia, unspecified: Secondary | ICD-10-CM

## 2013-08-23 DIAGNOSIS — I498 Other specified cardiac arrhythmias: Secondary | ICD-10-CM

## 2013-08-23 LAB — PACEMAKER DEVICE OBSERVATION
AL AMPLITUDE: 5 mv
AL IMPEDENCE PM: 337.5 Ohm
AL THRESHOLD: 0.625 V
BATTERY VOLTAGE: 2.9478 V

## 2013-08-23 NOTE — Progress Notes (Signed)
Pacemaker check in clinic. Normal device function. Thresholds, sensing, impedances consistent with previous measurements. Device programmed to maximize longevity. 60 mode switches 4-18 seconds, - coumadin.  No high ventricular rates noted. Device programmed at appropriate safety margins. Histogram distribution appropriate for patient activity level. Device programmed to optimize intrinsic conduction. Estimated longevity 7.8 years. Patient enrolled in remote follow-up/TTM's with [employer]. Plan to follow every 3 months remotely and see annually in office. Patient education completed.  ROV 3 months with Dr. Ladona Ridgel.

## 2013-08-29 ENCOUNTER — Other Ambulatory Visit: Payer: Self-pay

## 2013-08-29 DIAGNOSIS — Z1231 Encounter for screening mammogram for malignant neoplasm of breast: Secondary | ICD-10-CM

## 2013-09-11 ENCOUNTER — Encounter: Payer: Self-pay | Admitting: Internal Medicine

## 2013-09-19 ENCOUNTER — Ambulatory Visit: Payer: Medicare Other

## 2013-09-19 DIAGNOSIS — Z23 Encounter for immunization: Secondary | ICD-10-CM | POA: Diagnosis not present

## 2013-10-02 ENCOUNTER — Encounter: Payer: Self-pay | Admitting: Cardiology

## 2013-10-02 DIAGNOSIS — Z86711 Personal history of pulmonary embolism: Secondary | ICD-10-CM | POA: Insufficient documentation

## 2013-10-02 DIAGNOSIS — R112 Nausea with vomiting, unspecified: Secondary | ICD-10-CM | POA: Insufficient documentation

## 2013-10-02 DIAGNOSIS — N8110 Cystocele, unspecified: Secondary | ICD-10-CM | POA: Insufficient documentation

## 2013-10-02 DIAGNOSIS — Z9889 Other specified postprocedural states: Secondary | ICD-10-CM

## 2013-10-02 DIAGNOSIS — K5790 Diverticulosis of intestine, part unspecified, without perforation or abscess without bleeding: Secondary | ICD-10-CM | POA: Insufficient documentation

## 2013-10-02 DIAGNOSIS — I10 Essential (primary) hypertension: Secondary | ICD-10-CM | POA: Insufficient documentation

## 2013-10-02 DIAGNOSIS — Z8619 Personal history of other infectious and parasitic diseases: Secondary | ICD-10-CM | POA: Insufficient documentation

## 2013-10-04 ENCOUNTER — Ambulatory Visit (INDEPENDENT_AMBULATORY_CARE_PROVIDER_SITE_OTHER): Payer: Medicare Other | Admitting: Cardiology

## 2013-10-04 ENCOUNTER — Encounter: Payer: Self-pay | Admitting: Cardiology

## 2013-10-04 ENCOUNTER — Encounter (INDEPENDENT_AMBULATORY_CARE_PROVIDER_SITE_OTHER): Payer: Self-pay

## 2013-10-04 VITALS — BP 120/70 | HR 86 | Ht 63.5 in | Wt 210.0 lb

## 2013-10-04 DIAGNOSIS — I498 Other specified cardiac arrhythmias: Secondary | ICD-10-CM | POA: Diagnosis not present

## 2013-10-04 DIAGNOSIS — I10 Essential (primary) hypertension: Secondary | ICD-10-CM

## 2013-10-04 DIAGNOSIS — R001 Bradycardia, unspecified: Secondary | ICD-10-CM

## 2013-10-04 NOTE — Progress Notes (Signed)
[address] [place], [place]  [address] Phone: [phone] Fax:  [phone]  Date:  10/04/2013   ID:  Melissa Novak, DOB November 19, 1934, MRN [id number]  PCP:  Ginette Otto, MD   History of Present Illness: Melissa Novak is a 79 y.o. female   Last week at the beach at [place]. Playing bridge and looking out at the ocean and suddenly became dizzy. Decided to lay down, vomiting for a while. Stayed in bed for the rest of the day. Two of the ladies there both had had heart attacks. No pain. Next day OK. No further or associated symptoms.    Wt Readings from Last 3 Encounters:  10/04/13 210 lb (95.255 kg)  12/07/12 208 lb (94.348 kg)  12/07/12 208 lb (94.348 kg)     Past Medical History  Diagnosis Date  . Diverticulosis   . Hypertension   . Prolapse of anterior vaginal wall   . History of shingles 2004 --  EYES  , NO RESIDUAL PAIN  . History of pulmonary embolism 1970'S  . Coronary artery disease CARDIOLOGIST- DR Caro Hight-  LAST VISIT THIS WEEK--  WILL REQUEST NOTE, STRESS TEST     PT DENIES S & S  . PONV (postoperative nausea and vomiting)     pt states no n/v w 4/13 surgery  . Pacemaker 4/13    DDD St Jude  . Cough     pt states has had cough  5-7 weeks. pcp aware    Past Surgical History  Procedure Laterality Date  . Cholecystectomy  1967  . Vaginal hysterectomy  1974    STILL HAS OVARIES  . Cataract extraction w/ intraocular lens  implant, bilateral    . Transthoracic echocardiogram  02-15-2012  DR Janari Yamada    NORMAL EF 60-65%/ MILD AORTIC SCLEROSIS, NO STENOSIS, GRADE I DIASTOLIC DYSFUNCTION  . Cardiovascular stress test  02-14-2012  DR Hawke Villalpando    LOW RISK NUCLEAR TEST/ MILD FIXED DEFECT ALONG THE DISTAL ANTERIOR SEPTAL WALL CONSTISTENT WITH LBBB/ NO ISCHEMIA   . 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 Breast surgery      biopsy  . Insert / replace / remove pacemaker  4/13    ddd St Jude pacer,d/t symptomatic bradycardia w intermittent complete heart block.  Inserted By Dr. Sharrell Ku 4/13  . Pubovaginal sling  12/07/2012    Procedure: Leonides Grills;  Surgeon: Kathi Ludwig, MD;  Location: [facility];  Service: Urology;  Laterality: N/A;  solyx sling     Current Outpatient Prescriptions  Medication Sig Dispense Refill  . amLODipine (NORVASC) 5 MG tablet Take 5 mg by mouth every morning.      . Ascorbic Acid (VITAMIN C) 1000 MG tablet Take 1,000 mg by mouth daily.      Marland Kitchen aspirin 81 MG tablet Take 81 mg by mouth daily.      . calcium carbonate (OS-CAL) 600 MG TABS tablet Take 600 mg by mouth 2 (two) times daily with a meal.      . Cholecalciferol (VITAMIN D PO) Take 1 tablet by mouth daily.      . Coenzyme Q10 (COQ-10 PO) Take 1 tablet by mouth daily.       Marland Kitchen FEMRING 0.1 MG/24HR RING  Place 1 each vaginally See admin instructions. Change every 90 days      . Flaxseed, Linseed, (FLAX SEED OIL PO) Take 1 capsule by mouth daily.       . furosemide (LASIX) 20 MG tablet Take 20 mg by mouth daily.      Marland Kitchen GARLIC PO Take 1 tablet by mouth daily.       . Misc Natural Products (OSTEO BI-FLEX JOINT SHIELD PO) Take 1 tablet by mouth daily.       . Multiple Vitamin (MULTIVITAMIN WITH MINERALS) TABS Take 1 tablet by mouth daily.      . Multiple Vitamins-Minerals (ZINC PO) Take 1 tablet by mouth daily.       . Omega-3 Fatty Acids (FISH OIL PO) Take 1 capsule by mouth daily.       . valACYclovir (VALTREX) 1000 MG tablet Take 1,000 mg by mouth daily.       No current facility-administered medications for this visit.    Allergies:    Allergies  Allergen Reactions  . Sulfa Antibiotics Anaphylaxis  . Dilaudid [Hydromorphone Hcl] Nausea And Vomiting  . Percocet [Oxycodone-Acetaminophen] Other (See Comments)     Patient doesn't remember the reaction but says she is allergic  . Tramadol Hives, Nausea Only and Other (See Comments)    Blurred vision  . Benadryl [Diphenhydramine Hcl] Rash and Other (See Comments)    BLURRED VISION  . Cephalexin Rash  . Codeine Rash  . Demerol Nausea Only and Rash  . Dimenhydrinate Rash and Other (See Comments)    BLURRED VISION  . Meclizine Hcl Rash and Other (See Comments)    BLURRED VISION  . Penicillins Rash and Other (See Comments)    AND FEVER  . Pentazocine Lactate Rash and Other (See Comments)    AND FEVER    Social History:  The patient  reports that she has quit smoking. She has never used smokeless tobacco. She reports that she drinks alcohol. She reports that she does not use illicit drugs.   ROS:  Please see the history of present illness.   No fevers, no syncope, no orthopnea, no PND   PHYSICAL EXAM: VS:  BP 120/70  Pulse 86  Ht 5' 3.5" (1.613 m)  Wt 210 lb (95.255 kg)  BMI 36.61 kg/m2 Well nourished, well developed, in no acute distress HEENT: normal Neck: no JVD Cardiac:  normal S1, S2; RRR; no murmur Lungs:  clear to auscultation bilaterally, no wheezing, rhonchi or rales Abd: soft, nontender, no hepatomegaly Ext: no edema Skin: warm and dry Neuro: no focal abnormalities noted  EKG:  Sinus rhythm, 86, left anterior fascicular block, intraventricular conduction delay.     ASSESSMENT AND PLAN:  1. Vertigo-acute vertiginous symptom at beach. No other associated weakness dysphagia. No other evidence of stroke. No further symptoms. Continue to monitor. She will discuss with Dr. Pete Glatter. 2. Left anterior fascicular block-no changes made. Interventricular conduction delay. Pacemaker for backup. 3. Branch block-chronic. 4. Pacemaker-functioning well.  Signed, Donato Schultz, MD [facility]  10/04/2013 3:57 PM

## 2013-10-04 NOTE — Patient Instructions (Signed)
Your physician recommends that you continue on your current medications as directed. Please refer to the Current Medication list given to you today.  Your physician wants you to follow-up in: 6 months with Dr. Skains. You will receive a reminder letter in the mail two months in advance. If you don't receive a letter, please call our office to schedule the follow-up appointment.  

## 2013-10-10 ENCOUNTER — Ambulatory Visit
Admission: RE | Admit: 2013-10-10 | Discharge: 2013-10-10 | Disposition: A | Discharge status: Home / Self Care | Payer: Medicare Other | Source: Ambulatory Visit

## 2013-10-10 DIAGNOSIS — Z1231 Encounter for screening mammogram for malignant neoplasm of breast: Secondary | ICD-10-CM

## 2013-10-14 ENCOUNTER — Other Ambulatory Visit: Payer: Self-pay | Admitting: Geriatric Medicine

## 2013-10-14 DIAGNOSIS — R928 Other abnormal and inconclusive findings on diagnostic imaging of breast: Secondary | ICD-10-CM

## 2013-10-21 ENCOUNTER — Ambulatory Visit
Admission: RE | Admit: 2013-10-21 | Discharge: 2013-10-21 | Disposition: A | Discharge status: Home / Self Care | Payer: Medicare Other | Source: Ambulatory Visit | Attending: Geriatric Medicine | Admitting: Geriatric Medicine

## 2013-10-21 DIAGNOSIS — R928 Other abnormal and inconclusive findings on diagnostic imaging of breast: Secondary | ICD-10-CM | POA: Diagnosis not present

## 2013-11-08 DIAGNOSIS — Z79899 Other long term (current) drug therapy: Secondary | ICD-10-CM | POA: Diagnosis not present

## 2013-11-08 DIAGNOSIS — I10 Essential (primary) hypertension: Secondary | ICD-10-CM | POA: Diagnosis not present

## 2013-11-28 ENCOUNTER — Encounter: Payer: Medicare Other | Admitting: Internal Medicine

## 2013-12-03 DIAGNOSIS — N952 Postmenopausal atrophic vaginitis: Secondary | ICD-10-CM | POA: Diagnosis not present

## 2013-12-10 DIAGNOSIS — N952 Postmenopausal atrophic vaginitis: Secondary | ICD-10-CM | POA: Diagnosis not present

## 2013-12-19 ENCOUNTER — Encounter: Payer: Self-pay | Admitting: Internal Medicine

## 2013-12-19 ENCOUNTER — Ambulatory Visit (INDEPENDENT_AMBULATORY_CARE_PROVIDER_SITE_OTHER): Payer: Medicare Other | Admitting: Internal Medicine

## 2013-12-19 VITALS — BP 142/82 | HR 95 | Ht 63.0 in | Wt 208.0 lb

## 2013-12-19 DIAGNOSIS — I10 Essential (primary) hypertension: Secondary | ICD-10-CM

## 2013-12-19 DIAGNOSIS — I498 Other specified cardiac arrhythmias: Secondary | ICD-10-CM | POA: Diagnosis not present

## 2013-12-19 DIAGNOSIS — R001 Bradycardia, unspecified: Secondary | ICD-10-CM

## 2013-12-19 DIAGNOSIS — I442 Atrioventricular block, complete: Secondary | ICD-10-CM

## 2013-12-19 DIAGNOSIS — Z950 Presence of cardiac pacemaker: Secondary | ICD-10-CM | POA: Diagnosis not present

## 2013-12-19 LAB — MDC_IDC_ENUM_SESS_TYPE_INCLINIC
Battery Remaining Longevity: 96 mo
Implantable Pulse Generator Model: 2210
Implantable Pulse Generator Serial Number: 7338769
Lead Channel Pacing Threshold Amplitude: 0.5 V
Lead Channel Pacing Threshold Pulse Width: 0.4 ms
Lead Channel Sensing Intrinsic Amplitude: 9.7 mV
Lead Channel Setting Pacing Amplitude: 1.625
Lead Channel Setting Pacing Pulse Width: 0.4 ms
Lead Channel Setting Sensing Sensitivity: 4 mV
MDC IDC MSMT BATTERY VOLTAGE: 2.95 V
MDC IDC MSMT LEADCHNL RA IMPEDANCE VALUE: 362.5 Ohm
MDC IDC MSMT LEADCHNL RA PACING THRESHOLD AMPLITUDE: 0.625 V
MDC IDC MSMT LEADCHNL RA PACING THRESHOLD PULSEWIDTH: 0.4 ms
MDC IDC MSMT LEADCHNL RA SENSING INTR AMPL: 5 mV
MDC IDC MSMT LEADCHNL RV IMPEDANCE VALUE: 450 Ohm
MDC IDC SESS DTM: 20150129103741
MDC IDC SET LEADCHNL RV PACING AMPLITUDE: 0.75 V
MDC IDC STAT BRADY RA PERCENT PACED: 0.06 %
MDC IDC STAT BRADY RV PERCENT PACED: 99.96 %

## 2013-12-19 NOTE — Assessment & Plan Note (Signed)
Her blood pressure is slightly elevated today. She will maintain a low-sodium diet. She will continue her current medical therapy with amlodipine.

## 2013-12-19 NOTE — Progress Notes (Signed)
HPI [name] returns today for followup. She is a pleasant 80 year old woman with a history of complete heart block, status post permanent pacemaker insertion, hypertension,  who returns today after a long absence from our arrhythmia clinic. She notes a cough which is finally improving after approximately a month. She denies chest pain or shortness of breath. No fevers and chills. She is very minimal peripheral edema. No syncope.  No palpitations. Allergies  Allergen Reactions  . Sulfa Antibiotics Anaphylaxis  . Dilaudid [Hydromorphone Hcl] Nausea And Vomiting  . Percocet [Oxycodone-Acetaminophen] Other (See Comments)    Patient doesn't remember the reaction but says she is allergic  . Tramadol Hives, Nausea Only and Other (See Comments)    Blurred vision  . Benadryl [Diphenhydramine Hcl] Rash and Other (See Comments)    BLURRED VISION  . Cephalexin Rash  . Codeine Rash  . Demerol Nausea Only and Rash  . Dimenhydrinate Rash and Other (See Comments)    BLURRED VISION  . Meclizine Hcl Rash and Other (See Comments)    BLURRED VISION  . Penicillins Rash and Other (See Comments)    AND FEVER  . Pentazocine Lactate Rash and Other (See Comments)    AND FEVER     Current Outpatient Prescriptions  Medication Sig Dispense Refill  . amLODipine (NORVASC) 5 MG tablet Take 5 mg by mouth every morning.      . Ascorbic Acid (VITAMIN C) 1000 MG tablet Take 1,000 mg by mouth daily.      Marland Kitchen aspirin 81 MG tablet Take 81 mg by mouth daily.      . calcium carbonate (OS-CAL) 600 MG TABS tablet Take 600 mg by mouth 2 (two) times daily with a meal.      . Cholecalciferol (VITAMIN D PO) Take 1 tablet by mouth daily.      . Coenzyme Q10 (COQ-10 PO) Take 1 tablet by mouth daily.       Marland Kitchen FEMRING 0.1 MG/24HR RING Place 1 each vaginally See admin instructions. Change every 90 days      . Flaxseed, Linseed, (FLAX SEED OIL PO) Take 1 capsule by mouth daily.       . furosemide (LASIX) 20 MG tablet  Take 20 mg by mouth daily.      Marland Kitchen GARLIC PO Take 1 tablet by mouth daily.       . Misc Natural Products (OSTEO BI-FLEX JOINT SHIELD PO) Take 1 tablet by mouth daily.       . Multiple Vitamin (MULTIVITAMIN WITH MINERALS) TABS Take 1 tablet by mouth daily.      . Multiple Vitamins-Minerals (ZINC PO) Take 1 tablet by mouth daily.       . Omega-3 Fatty Acids (FISH OIL PO) Take 1 capsule by mouth daily.       . valACYclovir (VALTREX) 1000 MG tablet Take 1,000 mg by mouth daily.       No current facility-administered medications for this visit.     Past Medical History  Diagnosis Date  . Diverticulosis   . Hypertension   . Prolapse of anterior vaginal wall   . History of shingles 2004 --  EYES  , NO RESIDUAL PAIN  . History of pulmonary embolism 1970'S  . Coronary artery disease CARDIOLOGIST- DR Gillian Shields-  LAST VISIT THIS WEEK--  WILL REQUEST NOTE, STRESS TEST     PT DENIES S & S  . PONV (postoperative nausea and vomiting)     pt states no n/v  w 4/13 surgery  . Pacemaker 4/13    DDD St Jude  . Cough     pt states has had cough  5-7 weeks. pcp aware    ROS:   All systems reviewed and negative except as noted in the HPI.   Past Surgical History  Procedure Laterality Date  . Cholecystectomy  1967  . Vaginal hysterectomy  1974    STILL HAS OVARIES  . Cataract extraction w/ intraocular lens  implant, bilateral    . Transthoracic echocardiogram  02-15-2012  DR SKAINS    NORMAL EF 60-65%/ MILD AORTIC SCLEROSIS, NO STENOSIS, GRADE I DIASTOLIC DYSFUNCTION  . Cardiovascular stress test  02-14-2012  DR SKAINS    LOW RISK NUCLEAR TEST/ MILD FIXED DEFECT ALONG THE DISTAL ANTERIOR SEPTAL WALL CONSTISTENT WITH LBBB/ NO ISCHEMIA   . 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 Breast surgery      biopsy    . Insert / replace / remove pacemaker  4/13    ddd St Jude pacer,d/t symptomatic bradycardia w intermittent complete heart block.  Inserted By Dr. Crissie Sickles 4/13  . Pubovaginal sling  12/07/2012    Procedure: Gaynelle Arabian;  Surgeon: Ailene Rud, MD;  Location: [facility];  Service: Urology;  Laterality: N/A;  solyx sling      Family History  Problem Relation Age of Onset  . Kidney disease Son   . Cancer Mother   . Cancer Father   . Cancer Brother   . Kidney disease Brother      History   Social History  . Marital Status: Widowed    Spouse Name: N/A    Number of Children: N/A  . Years of Education: N/A   Occupational History  . Not on file.   Social History Main Topics  . Smoking status: Former Research scientist (life sciences)  . Smokeless tobacco: Never Used  . Alcohol Use: Yes     Comment: RARE  . Drug Use: No  . Sexual Activity: Not on file   Other Topics Concern  . Not on file   Social History Narrative  . No narrative on file     BP 142/82  Pulse 95  Ht 5\' 3"  (1.6 m)  Wt 208 lb (94.348 kg)  BMI 36.85 kg/m2  Physical Exam:  Well appearing NAD HEENT: Unremarkable Neck:  No JVD, no thyromegally Lymphatics:  No adenopathy Back:  No CVA tenderness Lungs:  Clear with no wheezes, rales, or rhonchi. HEART:  Regular rate rhythm, no murmurs, no rubs, no clicks Abd:  soft, positive bowel sounds, no organomegally, no rebound, no guarding Ext:  2 plus pulses, no edema, no cyanosis, no clubbing Skin:  No rashes no nodules Neuro:  CN II through XII intact, motor grossly intact   DEVICE  Normal device function.  See PaceArt for details.   Assess/Plan:

## 2013-12-19 NOTE — Patient Instructions (Signed)

## 2013-12-19 NOTE — Assessment & Plan Note (Signed)
Her St. Jude dual-chamber pacemaker is working normally. She has no underlying ventricular escape rhythm. We'll plan to recheck in several months.

## 2013-12-25 DIAGNOSIS — J209 Acute bronchitis, unspecified: Secondary | ICD-10-CM | POA: Diagnosis not present

## 2013-12-27 ENCOUNTER — Other Ambulatory Visit: Payer: Self-pay | Admitting: Nurse Practitioner

## 2013-12-27 ENCOUNTER — Ambulatory Visit
Admission: RE | Admit: 2013-12-27 | Discharge: 2013-12-27 | Disposition: A | Discharge status: Home / Self Care | Payer: Medicare Other | Source: Ambulatory Visit | Attending: Nurse Practitioner | Admitting: Nurse Practitioner

## 2013-12-27 DIAGNOSIS — J069 Acute upper respiratory infection, unspecified: Secondary | ICD-10-CM

## 2013-12-27 DIAGNOSIS — J42 Unspecified chronic bronchitis: Secondary | ICD-10-CM | POA: Diagnosis not present

## 2014-01-02 DIAGNOSIS — J069 Acute upper respiratory infection, unspecified: Secondary | ICD-10-CM | POA: Diagnosis not present

## 2014-01-09 ENCOUNTER — Encounter: Payer: Self-pay | Admitting: Internal Medicine

## 2014-01-14 ENCOUNTER — Telehealth: Payer: Self-pay | Admitting: Internal Medicine

## 2014-01-14 NOTE — Telephone Encounter (Signed)
New problem    Pt is going to [facility] for 3wks she has a home monitor, will that be a problem being away for the 3wks, pt is leaving 2/25 ?Marland Kitchen   Please give pt a call back.

## 2014-01-14 NOTE — Telephone Encounter (Signed)
Patient informed that she does not have to take pacemaker transmitter with her being that her device has looked fine over time and was just checked a few weeks ago. Patient voiced understanding.

## 2014-03-05 DIAGNOSIS — N952 Postmenopausal atrophic vaginitis: Secondary | ICD-10-CM | POA: Diagnosis not present

## 2014-03-25 ENCOUNTER — Encounter: Payer: Self-pay | Admitting: Internal Medicine

## 2014-03-25 ENCOUNTER — Ambulatory Visit (INDEPENDENT_AMBULATORY_CARE_PROVIDER_SITE_OTHER): Payer: Medicare Other | Admitting: *Deleted

## 2014-03-25 DIAGNOSIS — I498 Other specified cardiac arrhythmias: Secondary | ICD-10-CM

## 2014-03-25 DIAGNOSIS — R001 Bradycardia, unspecified: Secondary | ICD-10-CM

## 2014-03-29 LAB — MDC_IDC_ENUM_SESS_TYPE_REMOTE
Battery Remaining Longevity: 101 mo
Battery Voltage: 2.95 V
Brady Statistic AP VP Percent: 1 %
Brady Statistic AS VS Percent: 1 %
Brady Statistic RV Percent Paced: 99 %
Implantable Pulse Generator Model: 2210
Lead Channel Pacing Threshold Amplitude: 0.75 V
Lead Channel Pacing Threshold Pulse Width: 0.4 ms
Lead Channel Sensing Intrinsic Amplitude: 9.7 mV
Lead Channel Setting Sensing Sensitivity: 4 mV
MDC IDC MSMT LEADCHNL RA IMPEDANCE VALUE: 390 Ohm
MDC IDC MSMT LEADCHNL RA SENSING INTR AMPL: 5 mV
MDC IDC MSMT LEADCHNL RV IMPEDANCE VALUE: 480 Ohm
MDC IDC MSMT LEADCHNL RV PACING THRESHOLD AMPLITUDE: 0.5 V
MDC IDC MSMT LEADCHNL RV PACING THRESHOLD PULSEWIDTH: 0.4 ms
MDC IDC PG SERIAL: 7338769
MDC IDC SESS DTM: 20150505132903
MDC IDC SET LEADCHNL RA PACING AMPLITUDE: 1.75 V
MDC IDC SET LEADCHNL RV PACING AMPLITUDE: 0.75 V
MDC IDC SET LEADCHNL RV PACING PULSEWIDTH: 0.4 ms
MDC IDC STAT BRADY AP VS PERCENT: 1 %
MDC IDC STAT BRADY AS VP PERCENT: 99 %
MDC IDC STAT BRADY RA PERCENT PACED: 1 %

## 2014-04-03 ENCOUNTER — Encounter: Payer: Self-pay | Admitting: Cardiology

## 2014-04-04 NOTE — Progress Notes (Signed)
Remote pacemaker transmission.   

## 2014-04-10 ENCOUNTER — Telehealth: Payer: Self-pay | Admitting: Cardiology

## 2014-04-10 ENCOUNTER — Ambulatory Visit: Payer: Medicare Other | Admitting: Cardiology

## 2014-04-10 NOTE — Telephone Encounter (Signed)
New Message  Pt called states that she is experiencing swelling in her left and ankle and foot. Requests a call back to discuss.

## 2014-04-18 NOTE — Telephone Encounter (Signed)
called pt with Dr.Skains recommendations. STOP Amlodipine. take an extra Lasix 20mg  today and tomorrow.pt should monitor bp daily and call next week Mon or Wed  with readings.pt was anxious about stopping Amlodipine pt concerned that her bp would spike.pt is going out of town for 2 weeks on 6/9.adv pt to call with readings next Mon or Wed afternoon while Dr.Skains was in the office so that if changes needed to be made it could be addressed by him those days.pt agreeable with plan and verbalized understanding.

## 2014-04-18 NOTE — Telephone Encounter (Signed)
called pt to check on sypmtoms.pt sts that she has had LE edema over the last week.pt sts that her left ankle is more swollen than the right.pt denies acute sob.pt sts that she is [employer] and contributes that to deconditioning.pt denies chest pain,chills.pt ts that she elevates her legs several times through the day to compensate for swelling.adv pt that I will talk with Dr.Skains and call back with his recommendations.pt agreeable with plan and verbalized understanding.

## 2014-04-21 ENCOUNTER — Telehealth: Payer: Self-pay | Admitting: Cardiology

## 2014-04-21 MED ORDER — LOSARTAN POTASSIUM 25 MG PO TABS: 25.0000 mg | ORAL_TABLET | Freq: Every day | ORAL | Status: DC

## 2014-04-21 NOTE — Telephone Encounter (Signed)
New problem   Pt need to speak to nurse concerning her blood pressure readings. Please call pt.

## 2014-04-21 NOTE — Telephone Encounter (Signed)
Pt stopped Amlodipine on 5/29 per Dr Kingsley Plan instructions.  The pt's swelling overall has improved but she still has a small amount. She also noticed that wearing her supportive tennis shoes helps her swelling versus wearing sandals.    5/30 151/83 5/31 157/79  The pt does not check pulse 6/1 147/85   I will forward these readings to Dr Marlou Porch to review and make further recommendations if needed.

## 2014-04-21 NOTE — Telephone Encounter (Signed)
I spoke with the pt and made her aware of medication recommendation.  Rx sent to pharmacy. The pt will continue to monitor BP and contact the office if readings remain elevated.

## 2014-04-21 NOTE — Telephone Encounter (Signed)
Since BP still remains high, please start losartan 25mg  PO QD.

## 2014-04-24 ENCOUNTER — Telehealth: Payer: Self-pay | Admitting: Cardiology

## 2014-04-24 NOTE — Telephone Encounter (Signed)
New message    Patient calling stating her blood pressure medication  was change by Dr. Marlou Porch.    Would like her  blood pressure check today . Not happy with her reading from [place].   Yesterday 156/83   Today  157/81.

## 2014-04-24 NOTE — Telephone Encounter (Signed)
States Dr Marlou Porch just changed her BP med 3 days ago and she is concerned because her BP is running in the 150s/80s.  She would like her medication adjusted.  Advised she needs to continue on the med as RXed and keep a BP diary for a couple of weeks before a change will be made.  She has a f/u appt 6/22 and will bring the diary with her then.  She will call back before then if further concerns.

## 2014-05-12 ENCOUNTER — Ambulatory Visit (INDEPENDENT_AMBULATORY_CARE_PROVIDER_SITE_OTHER): Payer: Medicare Other | Admitting: Cardiology

## 2014-05-12 ENCOUNTER — Encounter: Payer: Self-pay | Admitting: Cardiology

## 2014-05-12 VITALS — BP 130/86 | HR 76 | Ht 63.0 in | Wt 210.8 lb

## 2014-05-12 DIAGNOSIS — R609 Edema, unspecified: Secondary | ICD-10-CM

## 2014-05-12 DIAGNOSIS — Z950 Presence of cardiac pacemaker: Secondary | ICD-10-CM | POA: Diagnosis not present

## 2014-05-12 DIAGNOSIS — I10 Essential (primary) hypertension: Secondary | ICD-10-CM

## 2014-05-12 DIAGNOSIS — R600 Localized edema: Secondary | ICD-10-CM

## 2014-05-12 NOTE — Progress Notes (Signed)
[facility], [address], [place]  [address] Phone: [phone] Fax:  [phone]  Date:  05/12/2014   ID:  Melissa Novak, DOB 1934/02/08, MRN [id number]  PCP:  Mathews Argyle, MD   History of Present Illness: Melissa Novak is a 80 y.o. female   Last week at the beach at [place]. Playing bridge and looking out at the ocean and suddenly became dizzy. Decided to lay down, vomiting for a while. Stayed in bed for the rest of the day. Two of the ladies there both had had heart attacks. No pain. Next day OK. No further or associated symptoms.   HTN - previously started losartan 25. Stopped amlodipine. Swelling improved. She went on a road trip with her cat who has renal failure and is on amlodipine.   Wt Readings from Last 3 Encounters:  05/12/14 210 lb 12.8 oz (95.618 kg)  12/19/13 208 lb (94.348 kg)  10/04/13 210 lb (95.255 kg)     Past Medical History  Diagnosis Date  . Diverticulosis   . Hypertension   . Prolapse of anterior vaginal wall   . History of shingles 2004 --  EYES  , NO RESIDUAL PAIN  . History of pulmonary embolism 1970'S  . Coronary artery disease CARDIOLOGIST- DR Gillian Shields-  LAST VISIT THIS WEEK--  WILL REQUEST NOTE, STRESS TEST     PT DENIES S & S  . PONV (postoperative nausea and vomiting)     pt states no n/v w 4/13 surgery  . Pacemaker 4/13    DDD St Jude  . Cough     pt states has had cough  5-7 weeks. pcp aware    Past Surgical History  Procedure Laterality Date  . Cholecystectomy  1967  . Vaginal hysterectomy  1974    STILL HAS OVARIES  . Cataract extraction w/ intraocular lens  implant, bilateral    . Transthoracic echocardiogram  02-15-2012  DR SKAINS    NORMAL EF 60-65%/ MILD AORTIC SCLEROSIS, NO STENOSIS, GRADE I DIASTOLIC DYSFUNCTION  . Cardiovascular stress test  02-14-2012  DR SKAINS    LOW RISK NUCLEAR TEST/ MILD FIXED DEFECT ALONG THE DISTAL ANTERIOR SEPTAL WALL CONSTISTENT WITH LBBB/ NO ISCHEMIA   . 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 Breast surgery      biopsy  . Insert / replace / remove pacemaker  4/13    ddd St Jude pacer,d/t symptomatic bradycardia w intermittent complete heart block.  Inserted By Dr. Crissie Sickles 4/13  . Pubovaginal sling  12/07/2012    Procedure: Gaynelle Arabian;  Surgeon: Ailene Rud, MD;  Location: [facility];  Service: Urology;  Laterality: N/A;  solyx sling     Current Outpatient Prescriptions  Medication Sig Dispense Refill  . Ascorbic Acid (VITAMIN C) 1000 MG tablet Take 1,000 mg by mouth daily.      Marland Kitchen aspirin 81 MG tablet Take 81 mg by mouth daily.      . calcium carbonate (OS-CAL) 600 MG TABS tablet Take 600 mg by mouth 2 (two) times daily with a meal.      . Cholecalciferol (VITAMIN D PO) Take 1 tablet by mouth daily.      . Coenzyme Q10 (COQ-10 PO) Take 1 tablet by mouth  daily.       Marland Kitchen FEMRING 0.1 MG/24HR RING Place 1 each vaginally See admin instructions. Change every 90 days      . Flaxseed, Linseed, (FLAX SEED OIL PO) Take 1 capsule by mouth daily.       . furosemide (LASIX) 20 MG tablet Take 20 mg by mouth daily.      Marland Kitchen GARLIC PO Take 1 tablet by mouth daily.       Marland Kitchen losartan (COZAAR) 25 MG tablet Take 1 tablet (25 mg total) by mouth daily.  90 tablet  3  . Misc Natural Products (OSTEO BI-FLEX JOINT SHIELD PO) Take 1 tablet by mouth daily.       . Multiple Vitamin (MULTIVITAMIN WITH MINERALS) TABS Take 1 tablet by mouth daily.      . Multiple Vitamins-Minerals (ZINC PO) Take 1 tablet by mouth daily.       . Omega-3 Fatty Acids (FISH OIL PO) Take 1 capsule by mouth daily.       . valACYclovir (VALTREX) 1000 MG tablet Take 1,000 mg by mouth daily.       No current facility-administered medications for this visit.    Allergies:      Allergies  Allergen Reactions  . Sulfa Antibiotics Anaphylaxis  . Dilaudid [Hydromorphone Hcl] Nausea And Vomiting  . Percocet [Oxycodone-Acetaminophen] Other (See Comments)    Patient doesn't remember the reaction but says she is allergic  . Tramadol Hives, Nausea Only and Other (See Comments)    Blurred vision  . Benadryl [Diphenhydramine Hcl] Rash and Other (See Comments)    BLURRED VISION  . Cephalexin Rash  . Codeine Rash  . Demerol Nausea Only and Rash  . Dimenhydrinate Rash and Other (See Comments)    BLURRED VISION  . Meclizine Hcl Rash and Other (See Comments)    BLURRED VISION  . Penicillins Rash and Other (See Comments)    AND FEVER  . Pentazocine Lactate Rash and Other (See Comments)    AND FEVER    Social History:  The patient  reports that she has quit smoking. She has never used smokeless tobacco. She reports that she drinks alcohol. She reports that she does not use illicit drugs.   ROS:  Please see the history of present illness.   No fevers, no syncope, no orthopnea, no PND   PHYSICAL EXAM: VS:  BP 130/86  Pulse 76  Ht 5\' 3"  (1.6 m)  Wt 210 lb 12.8 oz (95.618 kg)  BMI 37.35 kg/m2  SpO2 96% Well nourished, well developed, in no acute distress HEENT: normal Neck: no JVD Cardiac:  normal S1, S2; RRR; no murmur Lungs:  clear to auscultation bilaterally, no wheezing, rhonchi or rales Abd: soft, nontender, no hepatomegaly Ext: improve edema Skin: warm and dry Neuro: no focal abnormalities noted  EKG:  Sinus rhythm, 86, left anterior fascicular block, intraventricular conduction delay.     ASSESSMENT AND PLAN:  1. Edema-improved after discontinuing amlodipine. Started losartan. Continue with furosemide.Doing well.  2. Left anterior fascicular block-no changes made. Interventricular conduction delay. Pacemaker for backup. 3. HTN - controlled.  4. Bundle Branch block-chronic. 5. Pacemaker-functioning well.  Signed, Candee Furbish, MD [facility]  05/12/2014  10:49 AM

## 2014-05-12 NOTE — Patient Instructions (Signed)
Your physician recommends that you continue on your current medications as directed. Please refer to the Current Medication list given to you today.  Your physician wants you to follow-up in: 6 MONTH OV  You will receive a reminder letter in the mail two months in advance. If you don't receive a letter, please call our office to schedule the follow-up appointment.  

## 2014-06-02 DIAGNOSIS — N952 Postmenopausal atrophic vaginitis: Secondary | ICD-10-CM | POA: Diagnosis not present

## 2014-06-20 DIAGNOSIS — J40 Bronchitis, not specified as acute or chronic: Secondary | ICD-10-CM | POA: Diagnosis not present

## 2014-06-26 ENCOUNTER — Encounter: Payer: Self-pay | Admitting: Internal Medicine

## 2014-06-26 ENCOUNTER — Telehealth: Payer: Self-pay | Admitting: Cardiology

## 2014-06-26 ENCOUNTER — Ambulatory Visit (INDEPENDENT_AMBULATORY_CARE_PROVIDER_SITE_OTHER): Payer: Medicare Other | Admitting: *Deleted

## 2014-06-26 DIAGNOSIS — I498 Other specified cardiac arrhythmias: Secondary | ICD-10-CM | POA: Diagnosis not present

## 2014-06-26 DIAGNOSIS — R001 Bradycardia, unspecified: Secondary | ICD-10-CM

## 2014-06-26 NOTE — Telephone Encounter (Signed)
Spoke with pt and reminded pt of remote transmission that is due today. Pt verbalized understanding.   

## 2014-06-26 NOTE — Progress Notes (Signed)
Remote pacemaker transmission.   

## 2014-06-27 DIAGNOSIS — Z0000 Encounter for general adult medical examination without abnormal findings: Secondary | ICD-10-CM | POA: Diagnosis not present

## 2014-06-27 DIAGNOSIS — Z1331 Encounter for screening for depression: Secondary | ICD-10-CM | POA: Diagnosis not present

## 2014-06-27 DIAGNOSIS — Z79899 Other long term (current) drug therapy: Secondary | ICD-10-CM | POA: Diagnosis not present

## 2014-06-27 DIAGNOSIS — I10 Essential (primary) hypertension: Secondary | ICD-10-CM | POA: Diagnosis not present

## 2014-06-30 LAB — MDC_IDC_ENUM_SESS_TYPE_REMOTE
Battery Remaining Percentage: 67 %
Brady Statistic AP VP Percent: 1 %
Brady Statistic AS VP Percent: 99 %
Brady Statistic AS VS Percent: 1 %
Brady Statistic RV Percent Paced: 99 %
Date Time Interrogation Session: 20150806183045
Implantable Pulse Generator Serial Number: 7338769
Lead Channel Impedance Value: 390 Ohm
Lead Channel Impedance Value: 450 Ohm
Lead Channel Pacing Threshold Amplitude: 0.5 V
Lead Channel Pacing Threshold Pulse Width: 0.4 ms
Lead Channel Sensing Intrinsic Amplitude: 9.7 mV
Lead Channel Setting Pacing Amplitude: 0.75 V
Lead Channel Setting Pacing Amplitude: 1.75 V
Lead Channel Setting Pacing Pulse Width: 0.4 ms
Lead Channel Setting Sensing Sensitivity: 4 mV
MDC IDC MSMT BATTERY REMAINING LONGEVITY: 86 mo
MDC IDC MSMT BATTERY VOLTAGE: 2.93 V
MDC IDC MSMT LEADCHNL RA PACING THRESHOLD AMPLITUDE: 0.75 V
MDC IDC MSMT LEADCHNL RA PACING THRESHOLD PULSEWIDTH: 0.4 ms
MDC IDC MSMT LEADCHNL RA SENSING INTR AMPL: 5 mV
MDC IDC STAT BRADY AP VS PERCENT: 1 %
MDC IDC STAT BRADY RA PERCENT PACED: 1 %

## 2014-07-04 ENCOUNTER — Encounter: Payer: Self-pay | Admitting: Cardiology

## 2014-08-07 DIAGNOSIS — M25569 Pain in unspecified knee: Secondary | ICD-10-CM | POA: Diagnosis not present

## 2014-08-07 DIAGNOSIS — Z6839 Body mass index (BMI) 39.0-39.9, adult: Secondary | ICD-10-CM | POA: Diagnosis not present

## 2014-08-07 DIAGNOSIS — J309 Allergic rhinitis, unspecified: Secondary | ICD-10-CM | POA: Diagnosis not present

## 2014-08-07 DIAGNOSIS — E669 Obesity, unspecified: Secondary | ICD-10-CM | POA: Diagnosis not present

## 2014-08-07 DIAGNOSIS — I10 Essential (primary) hypertension: Secondary | ICD-10-CM | POA: Diagnosis not present

## 2014-08-15 DIAGNOSIS — M25569 Pain in unspecified knee: Secondary | ICD-10-CM | POA: Diagnosis not present

## 2014-09-12 DIAGNOSIS — I10 Essential (primary) hypertension: Secondary | ICD-10-CM | POA: Diagnosis not present

## 2014-09-17 DIAGNOSIS — N952 Postmenopausal atrophic vaginitis: Secondary | ICD-10-CM | POA: Diagnosis not present

## 2014-09-29 ENCOUNTER — Ambulatory Visit (INDEPENDENT_AMBULATORY_CARE_PROVIDER_SITE_OTHER): Payer: Medicare Other | Admitting: *Deleted

## 2014-09-29 ENCOUNTER — Encounter: Payer: Self-pay | Admitting: Internal Medicine

## 2014-09-29 DIAGNOSIS — R001 Bradycardia, unspecified: Secondary | ICD-10-CM

## 2014-09-29 LAB — MDC_IDC_ENUM_SESS_TYPE_REMOTE
Battery Remaining Percentage: 67 %
Battery Voltage: 2.93 V
Brady Statistic AP VP Percent: 1 %
Brady Statistic AP VS Percent: 1 %
Brady Statistic AS VP Percent: 99 %
Brady Statistic AS VS Percent: 1 %
Brady Statistic RV Percent Paced: 99 %
Lead Channel Impedance Value: 450 Ohm
Lead Channel Pacing Threshold Amplitude: 0.5 V
Lead Channel Pacing Threshold Pulse Width: 0.4 ms
Lead Channel Pacing Threshold Pulse Width: 0.4 ms
Lead Channel Setting Pacing Amplitude: 0.75 V
Lead Channel Setting Pacing Amplitude: 1.75 V
Lead Channel Setting Sensing Sensitivity: 4 mV
MDC IDC MSMT BATTERY REMAINING LONGEVITY: 86 mo
MDC IDC MSMT LEADCHNL RA IMPEDANCE VALUE: 360 Ohm
MDC IDC MSMT LEADCHNL RA PACING THRESHOLD AMPLITUDE: 0.75 V
MDC IDC MSMT LEADCHNL RA SENSING INTR AMPL: 5 mV
MDC IDC PG SERIAL: 7338769
MDC IDC SESS DTM: 20151109134031
MDC IDC SET LEADCHNL RV PACING PULSEWIDTH: 0.4 ms
MDC IDC STAT BRADY RA PERCENT PACED: 1 %

## 2014-09-29 NOTE — Progress Notes (Signed)
Remote pacemaker transmission.   

## 2014-10-08 ENCOUNTER — Encounter: Payer: Self-pay | Admitting: Cardiology

## 2014-10-20 ENCOUNTER — Telehealth: Payer: Self-pay | Admitting: Internal Medicine

## 2014-10-20 NOTE — Telephone Encounter (Signed)
New message      Can pt have an MRI with a pacemaker?

## 2014-10-20 NOTE — Telephone Encounter (Signed)
Spoke with patient.  Her PPM is not MRI compatible.

## 2014-10-30 ENCOUNTER — Encounter (HOSPITAL_COMMUNITY): Payer: Self-pay | Admitting: Internal Medicine

## 2014-11-04 ENCOUNTER — Telehealth: Payer: Self-pay | Admitting: Cardiology

## 2014-11-04 NOTE — Telephone Encounter (Signed)
Left message to call back to discuss.

## 2014-11-04 NOTE — Telephone Encounter (Signed)
New Message        Pt calling stating she is having the same problem she had before, both feet and ankles are swelling, left foot has some discoloration. Please call back and advise.

## 2014-11-07 ENCOUNTER — Ambulatory Visit: Payer: Medicare Other | Admitting: Cardiology

## 2014-11-10 NOTE — Telephone Encounter (Signed)
Have attempted to contact pt several times.  Have left messages and now phone has been disconnected.

## 2014-11-17 ENCOUNTER — Telehealth: Payer: Self-pay | Admitting: Cardiology

## 2014-11-17 MED ORDER — LOSARTAN POTASSIUM 50 MG PO TABS: 50.0000 mg | ORAL_TABLET | Freq: Every day | ORAL | Status: DC

## 2014-11-17 NOTE — Telephone Encounter (Signed)
New message    Patient calling C/O swollen ankle & feet discoloration on left .   No chest pain , No sob.

## 2014-11-17 NOTE — Telephone Encounter (Signed)
Has been having edema and discoloration.  Was on Amlodipine but it was stopped because edema.  She was started on Losartan 25 mg a day.  She saw she PCP who increased her Losartan to 50 mg and restarted Amlodipine at 5 mg a day due to increased BP.  Pt called PCP because she was having edema and discoloration and he decreased the Amlodipine to 2.5 mg a day.  She also takes Furosemide 20 mg a day.  She reports edema more in the left than the right but up to ankles bilaterally with reddish/purple discoloration more on the left.  She denies any wt changes, SOB or abdominal bloating.  Advised to start with obtaining and wearing knee high compression stockings, to keep feet and legs elevated and reduce as much as sodium as possible from her diet.  She states understanding and that edema does get better when her feet have been elevated.  Aware I will forward this information to Dr Marlou Porch for review and further recommendations.  Pt is in [facility].

## 2014-11-17 NOTE — Telephone Encounter (Signed)
Agree with plan 

## 2014-11-18 NOTE — Telephone Encounter (Signed)
Pt aware to use compression stockings and keep feet elevated.  She will call back if further problems.

## 2014-11-25 ENCOUNTER — Ambulatory Visit (INDEPENDENT_AMBULATORY_CARE_PROVIDER_SITE_OTHER): Payer: Medicare Other | Admitting: Cardiology

## 2014-11-25 ENCOUNTER — Encounter: Payer: Self-pay | Admitting: Cardiology

## 2014-11-25 VITALS — BP 128/84 | HR 90 | Ht 63.0 in | Wt 218.0 lb

## 2014-11-25 DIAGNOSIS — R600 Localized edema: Secondary | ICD-10-CM

## 2014-11-25 DIAGNOSIS — I447 Left bundle-branch block, unspecified: Secondary | ICD-10-CM

## 2014-11-25 DIAGNOSIS — R001 Bradycardia, unspecified: Secondary | ICD-10-CM

## 2014-11-25 DIAGNOSIS — I10 Essential (primary) hypertension: Secondary | ICD-10-CM | POA: Diagnosis not present

## 2014-11-25 DIAGNOSIS — Z950 Presence of cardiac pacemaker: Secondary | ICD-10-CM | POA: Diagnosis not present

## 2014-11-25 DIAGNOSIS — E669 Obesity, unspecified: Secondary | ICD-10-CM

## 2014-11-25 NOTE — Patient Instructions (Signed)
The current medical regimen is effective;  continue present plan and medications.  Follow up in 6 months with Dr. Skains.  You will receive a letter in the mail 2 months before you are due.  Please call us when you receive this letter to schedule your follow up appointment.  Thank you for choosing [facility]!!     

## 2014-11-25 NOTE — Progress Notes (Signed)
[facility]. [address], [address], [place]  [address] Phone: [phone] Fax:  [phone]  Date:  11/25/2014   ID:  Melissa Novak, DOB 1934-08-09, MRN [id number]  PCP:  Mathews Argyle, MD   History of Present Illness: Melissa Novak is a 81 y.o. female here for follow-up, pacemaker, edema, hypertension, obesity. Previously had pauses up to 6 seconds and had pacemaker placed.  Weight has steadily been going up. She is going to the [facility], physical trainer. She states that Medicare will pay for 8 weeks of this.  Hypertension-she is having intermittent swelling but this seems to be improved on lower dose amlodipine. On losartan as well.  Denies any syncope, bleeding, chest pain. Has minor cough even on losartan. Pacemaker functioning well. Dr. Lovena Le.  She does have knee osteoarthritis. May need to have replacement.  Wt Readings from Last 3 Encounters:  11/25/14 218 lb (98.884 kg)  05/12/14 210 lb 12.8 oz (95.618 kg)  12/19/13 208 lb (94.348 kg)     Past Medical History  Diagnosis Date  . Diverticulosis   . Hypertension   . Prolapse of anterior vaginal wall   . History of shingles 2004 --  EYES  , NO RESIDUAL PAIN  . History of pulmonary embolism 1970'S  . Coronary artery disease CARDIOLOGIST- DR Gillian Shields-  LAST VISIT THIS WEEK--  WILL REQUEST NOTE, STRESS TEST     PT DENIES S & S  . PONV (postoperative nausea and vomiting)     pt states no n/v w 4/13 surgery  . Pacemaker 4/13    DDD St Jude  . Cough     pt states has had cough  5-7 weeks. pcp aware    Past Surgical History  Procedure Laterality Date  . Cholecystectomy  1967  . Vaginal hysterectomy  1974    STILL HAS OVARIES  . Cataract extraction w/ intraocular lens  implant, bilateral    . Transthoracic echocardiogram  02-15-2012  DR Mariza Bourget    NORMAL EF 60-65%/ MILD AORTIC SCLEROSIS, NO STENOSIS, GRADE I DIASTOLIC DYSFUNCTION  . Cardiovascular stress test  02-14-2012  DR Lynea Rollison    LOW RISK NUCLEAR  TEST/ MILD FIXED DEFECT ALONG THE DISTAL ANTERIOR SEPTAL WALL CONSTISTENT WITH LBBB/ NO ISCHEMIA   . Pubovaginal sling  02/24/2012    Procedure: Gaynelle Arabian;  Surgeon: Ailene Rud, MD;  Location: [facility];  Service: Urology;  Laterality: N/A;  [employer] ANTERIOR VAULT REPAIR OWER   . Breast surgery      biopsy  . Insert / replace / remove pacemaker  4/13    ddd St Jude pacer,d/t symptomatic bradycardia w intermittent complete heart block.  Inserted By Dr. Crissie Sickles 4/13  . Pubovaginal sling  12/07/2012    Procedure: Gaynelle Arabian;  Surgeon: Ailene Rud, MD;  Location: [facility];  Service: Urology;  Laterality: N/A;  solyx sling   . Permanent pacemaker insertion N/A 02/27/2012    Procedure: PERMANENT PACEMAKER INSERTION;  Surgeon: Evans Lance, MD;  Location: [facility] CATH LAB;  Service: Cardiovascular;  Laterality: N/A;    Current Outpatient Prescriptions  Medication Sig Dispense Refill  . amLODipine (NORVASC) 2.5 MG tablet Take 2.5 mg by mouth daily.    . Ascorbic Acid (VITAMIN C) 1000 MG tablet Take 1,000 mg by mouth daily.    Marland Kitchen aspirin 81 MG tablet Take 81 mg by mouth daily.    . calcium carbonate (OS-CAL)  600 MG TABS tablet Take 600 mg by mouth 2 (two) times daily with a meal.    . Cholecalciferol (VITAMIN D PO) Take 1 tablet by mouth daily.    . Coenzyme Q10 (COQ-10 PO) Take 1 tablet by mouth daily.     Marland Kitchen FEMRING 0.1 MG/24HR RING Place 1 each vaginally See admin instructions. Change every 90 days    . Flaxseed, Linseed, (FLAX SEED OIL PO) Take 1 capsule by mouth daily.     . furosemide (LASIX) 20 MG tablet Take 20 mg by mouth daily.    Marland Kitchen GARLIC PO Take 1 tablet by mouth daily.     Marland Kitchen losartan (COZAAR) 50 MG tablet Take 1 tablet (50 mg total) by mouth daily.    . Misc Natural Products (OSTEO BI-FLEX JOINT SHIELD PO) Take 1 tablet by mouth daily.     . Multiple Vitamin (MULTIVITAMIN WITH MINERALS) TABS Take 1  tablet by mouth daily.    . Multiple Vitamins-Minerals (ZINC PO) Take 1 tablet by mouth daily.     . Omega-3 Fatty Acids (FISH OIL PO) Take 1 capsule by mouth daily.     . valACYclovir (VALTREX) 1000 MG tablet Take 1,000 mg by mouth daily.     No current facility-administered medications for this visit.    Allergies:    Allergies  Allergen Reactions  . Sulfa Antibiotics Anaphylaxis  . Dilaudid [Hydromorphone Hcl] Nausea And Vomiting  . Percocet [Oxycodone-Acetaminophen] Other (See Comments)    Patient doesn't remember the reaction but says she is allergic  . Tramadol Hives, Nausea Only and Other (See Comments)    Blurred vision  . Benadryl [Diphenhydramine Hcl] Rash and Other (See Comments)    BLURRED VISION  . Cephalexin Rash  . Codeine Rash  . Demerol Nausea Only and Rash  . Dimenhydrinate Rash and Other (See Comments)    BLURRED VISION  . Meclizine Hcl Rash and Other (See Comments)    BLURRED VISION  . Penicillins Rash and Other (See Comments)    AND FEVER  . Pentazocine Lactate Rash and Other (See Comments)    AND FEVER    Social History:  The patient  reports that she has quit smoking. She has never used smokeless tobacco. She reports that she drinks alcohol. She reports that she does not use illicit drugs.   ROS:  Please see the history of present illness.   No fevers, no syncope, no orthopnea, no PND   PHYSICAL EXAM: VS:  BP 128/84 mmHg  Pulse 90  Ht 5\' 3"  (1.6 m)  Wt 218 lb (98.884 kg)  BMI 38.63 kg/m2 Well nourished, well developed, in no acute distress HEENT: normal Neck: no JVD Cardiac:  normal S1, S2; RRR; no murmur Lungs:  clear to auscultation bilaterally, no wheezing, rhonchi or rales Abd: soft, nontender, no hepatomegaly Ext: improve edema Skin: warm and dry Neuro: no focal abnormalities noted  EKG:  11/25/14, heart rate 90 Sinus rhythm, first-degree AV block, left bundle branch block Sinus rhythm, 86, left anterior fascicular block, intraventricular  conduction delay.     ASSESSMENT AND PLAN:  1. Edema-intermittent even with low-dose amlodipine but today it is improved. Losartan. Continue with furosemide.Doing well. Compression hose if possible. She states that this has been cutting into her calf. 2. Left bundle branch block-no changes made. Interventricular conduction delay. Pacemaker for backup. 3. HTN - controlled.  4. Pacemaker-functioning well. 5. Knee pain-Dr. Tonita Cong. Undergoing injections. May need replacement. She cannot have an MRI because of  her pacemaker.  Signed, Candee Furbish, MD [facility]  11/25/2014 10:31 AM

## 2014-11-28 DIAGNOSIS — H1789 Other corneal scars and opacities: Secondary | ICD-10-CM | POA: Diagnosis not present

## 2014-11-28 DIAGNOSIS — Z961 Presence of intraocular lens: Secondary | ICD-10-CM | POA: Diagnosis not present

## 2014-12-10 DIAGNOSIS — N952 Postmenopausal atrophic vaginitis: Secondary | ICD-10-CM | POA: Diagnosis not present

## 2014-12-23 ENCOUNTER — Encounter: Payer: Medicare Other | Admitting: Internal Medicine

## 2014-12-29 DIAGNOSIS — I10 Essential (primary) hypertension: Secondary | ICD-10-CM | POA: Diagnosis not present

## 2015-01-16 ENCOUNTER — Telehealth: Payer: Self-pay | Admitting: Cardiology

## 2015-01-16 DIAGNOSIS — R51 Headache: Secondary | ICD-10-CM | POA: Diagnosis not present

## 2015-01-16 DIAGNOSIS — I10 Essential (primary) hypertension: Secondary | ICD-10-CM | POA: Diagnosis not present

## 2015-01-16 DIAGNOSIS — M62838 Other muscle spasm: Secondary | ICD-10-CM | POA: Diagnosis not present

## 2015-01-16 NOTE — Telephone Encounter (Signed)
New message    Patient calling from [place] PCP there was contacted    Pt C/O BP issue: STAT if pt C/O blurred vision, one-sided weakness or slurred speech  1. What are your last 5 BP readings? Today  189/102 @ an hour ago. Yesterday  170/99 .    2. Are you having any other symptoms (ex. Dizziness, headache, blurred vision, passed out)? Headache x 2 days, some dizziness.    3. What is your BP issue? Wants to discuss medication.

## 2015-01-16 NOTE — Telephone Encounter (Signed)
Patient tells me she spoke with Dr. Carlyle Lipa office yesterday who told her to check her BP again today and take and extra Losartan if up again.  She states she did take an extra Losartan today but pressures are still up. Advised patient to call PCP back since they are monitoring/advising on her BP. She was agreeable to plan .

## 2015-01-21 ENCOUNTER — Encounter: Payer: Self-pay | Admitting: Internal Medicine

## 2015-01-21 ENCOUNTER — Ambulatory Visit (INDEPENDENT_AMBULATORY_CARE_PROVIDER_SITE_OTHER): Payer: Medicare Other | Admitting: Internal Medicine

## 2015-01-21 VITALS — BP 150/74 | HR 100 | Ht 63.0 in | Wt 214.6 lb

## 2015-01-21 DIAGNOSIS — Z950 Presence of cardiac pacemaker: Secondary | ICD-10-CM | POA: Diagnosis not present

## 2015-01-21 DIAGNOSIS — I10 Essential (primary) hypertension: Secondary | ICD-10-CM | POA: Diagnosis not present

## 2015-01-21 DIAGNOSIS — E669 Obesity, unspecified: Secondary | ICD-10-CM | POA: Diagnosis not present

## 2015-01-21 DIAGNOSIS — R001 Bradycardia, unspecified: Secondary | ICD-10-CM

## 2015-01-21 LAB — MDC_IDC_ENUM_SESS_TYPE_INCLINIC
Battery Remaining Longevity: 104.4 mo
Brady Statistic RA Percent Paced: 0.08 %
Brady Statistic RV Percent Paced: 99.91 %
Date Time Interrogation Session: 20160302110736
Implantable Pulse Generator Serial Number: 7338769
Lead Channel Impedance Value: 387.5 Ohm
Lead Channel Impedance Value: 487.5 Ohm
Lead Channel Pacing Threshold Amplitude: 0.5 V
Lead Channel Pacing Threshold Amplitude: 0.75 V
Lead Channel Pacing Threshold Pulse Width: 0.4 ms
Lead Channel Setting Pacing Amplitude: 0.75 V
Lead Channel Setting Pacing Amplitude: 1.75 V
Lead Channel Setting Pacing Pulse Width: 0.4 ms
Lead Channel Setting Sensing Sensitivity: 4 mV
MDC IDC MSMT BATTERY VOLTAGE: 2.93 V
MDC IDC MSMT LEADCHNL RA PACING THRESHOLD PULSEWIDTH: 0.4 ms
MDC IDC MSMT LEADCHNL RA SENSING INTR AMPL: 5 mV

## 2015-01-21 NOTE — Assessment & Plan Note (Signed)
Her St. Jude DDD PM is working normally. Will recheck in several months.  

## 2015-01-21 NOTE — Patient Instructions (Signed)
Your physician recommends that you continue on your current medications as directed. Please refer to the Current Medication list given to you today.  Your physician wants you to follow-up in: 1 year follow up with Dr. Lovena Le. You will receive a reminder letter in the mail two months in advance. If you don't receive a letter, please call our office to schedule the follow-up appointment.

## 2015-01-21 NOTE — Progress Notes (Signed)
HPI Melissa Novak returns today for followup. She is a pleasant 81 year old woman with a history of complete heart block, status post permanent pacemaker insertion, hypertension,  who returns today for PPM followup. She denies chest pain or shortness of breath. No fevers and chills. She is very minimal peripheral edema. No syncope.  No palpitations. She has been saddened by the loss of her cat who died a few months ago. Allergies  Allergen Reactions  . Sulfa Antibiotics Anaphylaxis  . Dilaudid [Hydromorphone Hcl] Nausea And Vomiting  . Percocet [Oxycodone-Acetaminophen] Other (See Comments)    Patient doesn't remember the reaction but says she is allergic  . Tramadol Hives, Nausea Only and Other (See Comments)    Blurred vision  . Benadryl [Diphenhydramine Hcl] Rash and Other (See Comments)    BLURRED VISION  . Cephalexin Rash  . Codeine Rash  . Demerol Nausea Only and Rash  . Dimenhydrinate Rash and Other (See Comments)    BLURRED VISION  . Meclizine Hcl Rash and Other (See Comments)    BLURRED VISION  . Penicillins Rash and Other (See Comments)    AND FEVER  . Pentazocine Lactate Rash and Other (See Comments)    AND FEVER     Current Outpatient Prescriptions  Medication Sig Dispense Refill  . amLODipine (NORVASC) 2.5 MG tablet Take 2.5 mg by mouth daily.    . Ascorbic Acid (VITAMIN C) 1000 MG tablet Take 1,000 mg by mouth daily.    Marland Kitchen aspirin 81 MG tablet Take 81 mg by mouth daily.    . calcium carbonate (OS-CAL) 600 MG TABS tablet Take 600 mg by mouth 2 (two) times daily with a meal.    . Cholecalciferol (VITAMIN D PO) Take 1 tablet by mouth daily.    . Coenzyme Q10 (COQ-10 PO) Take 1 tablet by mouth daily.     Marland Kitchen FEMRING 0.1 MG/24HR RING Place 1 each vaginally See admin instructions. Change every 90 days    . Flaxseed, Linseed, (FLAX SEED OIL PO) Take 1 capsule by mouth daily.     . furosemide (LASIX) 20 MG tablet Take 20 mg by mouth daily.    Marland Kitchen GARLIC PO Take 1 tablet  by mouth daily.     Marland Kitchen losartan (COZAAR) 50 MG tablet Take 1 tablet (50 mg total) by mouth daily.    . Misc Natural Products (OSTEO BI-FLEX JOINT SHIELD PO) Take 1 tablet by mouth daily.     . Multiple Vitamin (MULTIVITAMIN WITH MINERALS) TABS Take 1 tablet by mouth daily.    . Multiple Vitamins-Minerals (ZINC PO) Take 1 tablet by mouth daily.     . Omega-3 Fatty Acids (FISH OIL PO) Take 1 capsule by mouth daily.     . valACYclovir (VALTREX) 1000 MG tablet Take 1,000 mg by mouth daily.     No current facility-administered medications for this visit.     Past Medical History  Diagnosis Date  . Diverticulosis   . Hypertension   . Prolapse of anterior vaginal wall   . History of shingles 2004 --  EYES  , NO RESIDUAL PAIN  . History of pulmonary embolism 1970'S  . Coronary artery disease CARDIOLOGIST- DR Gillian Shields-  LAST VISIT THIS WEEK--  WILL REQUEST NOTE, STRESS TEST     PT DENIES S & S  . PONV (postoperative nausea and vomiting)     pt states no n/v w 4/13 surgery  . Pacemaker 4/13    DDD St Jude  .  Cough     pt states has had cough  5-7 weeks. pcp aware    ROS:   All systems reviewed and negative except as noted in the HPI.   Past Surgical History  Procedure Laterality Date  . Cholecystectomy  1967  . Vaginal hysterectomy  1974    STILL HAS OVARIES  . Cataract extraction w/ intraocular lens  implant, bilateral    . Transthoracic echocardiogram  02-15-2012  DR SKAINS    NORMAL EF 60-65%/ MILD AORTIC SCLEROSIS, NO STENOSIS, GRADE I DIASTOLIC DYSFUNCTION  . Cardiovascular stress test  02-14-2012  DR SKAINS    LOW RISK NUCLEAR TEST/ MILD FIXED DEFECT ALONG THE DISTAL ANTERIOR SEPTAL WALL CONSTISTENT WITH LBBB/ NO ISCHEMIA   . 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 Breast surgery       biopsy  . Insert / replace / remove pacemaker  4/13    ddd St Jude pacer,d/t symptomatic bradycardia w intermittent complete heart block.  Inserted By Dr. Crissie Sickles 4/13  . Pubovaginal sling  12/07/2012    Procedure: Gaynelle Arabian;  Surgeon: Ailene Rud, MD;  Location: [facility];  Service: Urology;  Laterality: N/A;  solyx sling   . Permanent pacemaker insertion N/A 02/27/2012    Procedure: PERMANENT PACEMAKER INSERTION;  Surgeon: Evans Lance, MD;  Location: [facility] CATH LAB;  Service: Cardiovascular;  Laterality: N/A;     Family History  Problem Relation Age of Onset  . Kidney disease Son   . Cancer Mother   . Cancer Father   . Cancer Brother   . Kidney disease Brother      History   Social History  . Marital Status: Widowed    Spouse Name: N/A  . Number of Children: N/A  . Years of Education: N/A   Occupational History  . Not on file.   Social History Main Topics  . Smoking status: Former Research scientist (life sciences)  . Smokeless tobacco: Never Used  . Alcohol Use: Yes     Comment: RARE  . Drug Use: No  . Sexual Activity: Not on file   Other Topics Concern  . Not on file   Social History Narrative     There were no vitals taken for this visit.  Physical Exam:  Well appearing 81 yo woman, NAD HEENT: Unremarkable Neck:  7 cm JVD, no thyromegally Back:  No CVA tenderness Lungs:  Clear with no wheezes, rales, or rhonchi. HEART:  Regular rate rhythm, no murmurs, no rubs, no clicks Abd:  soft, positive bowel sounds, no organomegally, no rebound, no guarding Ext:  2 plus pulses, trace peripheral edema, no cyanosis, no clubbing Skin:  No rashes no nodules Neuro:  CN II through XII intact, motor grossly intact   DEVICE  Normal device function.  See PaceArt for details.   Assess/Plan:

## 2015-01-21 NOTE — Assessment & Plan Note (Signed)
Her blood pressure is elevated. She admits to dietary indiscretion with sodium. She will continue her current meds. She may ultimately need uptitration of her anti-hypertensive meds.

## 2015-01-21 NOTE — Assessment & Plan Note (Signed)
She is encouraged to lose weight. She admits to dietary indiscretion.  

## 2015-01-30 ENCOUNTER — Encounter: Payer: Self-pay | Admitting: Internal Medicine

## 2015-02-17 DIAGNOSIS — R1310 Dysphagia, unspecified: Secondary | ICD-10-CM | POA: Diagnosis not present

## 2015-02-17 DIAGNOSIS — I10 Essential (primary) hypertension: Secondary | ICD-10-CM | POA: Diagnosis not present

## 2015-03-17 DIAGNOSIS — N952 Postmenopausal atrophic vaginitis: Secondary | ICD-10-CM | POA: Diagnosis not present

## 2015-04-22 ENCOUNTER — Telehealth: Payer: Self-pay | Admitting: Cardiology

## 2015-04-22 ENCOUNTER — Ambulatory Visit (INDEPENDENT_AMBULATORY_CARE_PROVIDER_SITE_OTHER): Payer: Medicare Other | Admitting: *Deleted

## 2015-04-22 DIAGNOSIS — R001 Bradycardia, unspecified: Secondary | ICD-10-CM | POA: Diagnosis not present

## 2015-04-22 NOTE — Progress Notes (Signed)
Remote pacemaker transmission.   

## 2015-04-22 NOTE — Telephone Encounter (Signed)
Spoke with pt and reminded pt of remote transmission that is due today. Pt verbalized understanding.   

## 2015-04-27 LAB — CUP PACEART REMOTE DEVICE CHECK
Battery Remaining Longevity: 86 mo
Battery Remaining Percentage: 67 %
Battery Voltage: 2.93 V
Brady Statistic AP VS Percent: 1 %
Brady Statistic AS VS Percent: 1 %
Brady Statistic RA Percent Paced: 1 %
Brady Statistic RV Percent Paced: 99 %
Lead Channel Impedance Value: 390 Ohm
Lead Channel Impedance Value: 460 Ohm
Lead Channel Pacing Threshold Amplitude: 0.5 V
Lead Channel Pacing Threshold Amplitude: 0.75 V
Lead Channel Pacing Threshold Pulse Width: 0.4 ms
Lead Channel Sensing Intrinsic Amplitude: 5 mV
Lead Channel Sensing Intrinsic Amplitude: 5 mV
Lead Channel Setting Pacing Amplitude: 1.75 V
Lead Channel Setting Pacing Pulse Width: 0.4 ms
MDC IDC MSMT LEADCHNL RV PACING THRESHOLD PULSEWIDTH: 0.4 ms
MDC IDC PG SERIAL: 7338769
MDC IDC SESS DTM: 20160601200933
MDC IDC SET LEADCHNL RV PACING AMPLITUDE: 0.75 V
MDC IDC SET LEADCHNL RV SENSING SENSITIVITY: 4 mV
MDC IDC STAT BRADY AP VP PERCENT: 1 %
MDC IDC STAT BRADY AS VP PERCENT: 99 %
Pulse Gen Model: 2210

## 2015-05-01 ENCOUNTER — Encounter: Payer: Self-pay | Admitting: Cardiology

## 2015-05-05 ENCOUNTER — Encounter: Payer: Self-pay | Admitting: Internal Medicine

## 2015-06-02 DIAGNOSIS — M25562 Pain in left knee: Secondary | ICD-10-CM | POA: Diagnosis not present

## 2015-06-02 DIAGNOSIS — M25561 Pain in right knee: Secondary | ICD-10-CM | POA: Diagnosis not present

## 2015-06-04 ENCOUNTER — Ambulatory Visit: Payer: PRIVATE HEALTH INSURANCE | Admitting: Cardiology

## 2015-06-24 DIAGNOSIS — N952 Postmenopausal atrophic vaginitis: Secondary | ICD-10-CM | POA: Diagnosis not present

## 2015-07-14 ENCOUNTER — Telehealth: Payer: Self-pay | Admitting: Internal Medicine

## 2015-07-14 NOTE — Telephone Encounter (Signed)
Changed pt's remote appt to 08/05/15. Pt does not keep remote in her room, keeps it in kitchen. Pt always sends remotes manually. Pt aware she can send remote anytime before lunch on 9/14; she is not bound by assigned time in Epic.

## 2015-07-14 NOTE — Telephone Encounter (Signed)
New message     Pt is scheduled for remote check on 8-31 and pt will be out of town Pt has question on how to handle check Please call to discuss

## 2015-07-17 ENCOUNTER — Ambulatory Visit: Payer: PRIVATE HEALTH INSURANCE | Admitting: Cardiology

## 2015-07-30 ENCOUNTER — Ambulatory Visit: Payer: PRIVATE HEALTH INSURANCE | Admitting: Cardiology

## 2015-08-05 ENCOUNTER — Ambulatory Visit (INDEPENDENT_AMBULATORY_CARE_PROVIDER_SITE_OTHER): Payer: Medicare Other | Admitting: *Deleted

## 2015-08-05 DIAGNOSIS — R001 Bradycardia, unspecified: Secondary | ICD-10-CM | POA: Diagnosis not present

## 2015-08-05 NOTE — Progress Notes (Signed)
Remote pacemaker transmission.   

## 2015-08-12 DIAGNOSIS — M810 Age-related osteoporosis without current pathological fracture: Secondary | ICD-10-CM | POA: Diagnosis not present

## 2015-08-12 DIAGNOSIS — Z0000 Encounter for general adult medical examination without abnormal findings: Secondary | ICD-10-CM | POA: Diagnosis not present

## 2015-08-12 DIAGNOSIS — Z6841 Body Mass Index (BMI) 40.0 and over, adult: Secondary | ICD-10-CM | POA: Diagnosis not present

## 2015-08-12 DIAGNOSIS — F325 Major depressive disorder, single episode, in full remission: Secondary | ICD-10-CM | POA: Diagnosis not present

## 2015-08-12 DIAGNOSIS — R739 Hyperglycemia, unspecified: Secondary | ICD-10-CM | POA: Diagnosis not present

## 2015-08-12 DIAGNOSIS — I10 Essential (primary) hypertension: Secondary | ICD-10-CM | POA: Diagnosis not present

## 2015-08-12 DIAGNOSIS — J984 Other disorders of lung: Secondary | ICD-10-CM | POA: Diagnosis not present

## 2015-08-12 DIAGNOSIS — Z79899 Other long term (current) drug therapy: Secondary | ICD-10-CM | POA: Diagnosis not present

## 2015-08-13 LAB — CUP PACEART REMOTE DEVICE CHECK
Battery Remaining Percentage: 81 %
Battery Voltage: 2.93 V
Brady Statistic AP VP Percent: 1 %
Brady Statistic AS VP Percent: 99 %
Brady Statistic AS VS Percent: 1 %
Brady Statistic RA Percent Paced: 1 %
Brady Statistic RV Percent Paced: 99 %
Lead Channel Impedance Value: 440 Ohm
Lead Channel Pacing Threshold Amplitude: 0.75 V
Lead Channel Pacing Threshold Pulse Width: 0.4 ms
Lead Channel Sensing Intrinsic Amplitude: 5 mV
Lead Channel Setting Pacing Amplitude: 1.75 V
Lead Channel Setting Sensing Sensitivity: 4 mV
MDC IDC MSMT BATTERY REMAINING LONGEVITY: 102 mo
MDC IDC MSMT LEADCHNL RA IMPEDANCE VALUE: 350 Ohm
MDC IDC MSMT LEADCHNL RV PACING THRESHOLD AMPLITUDE: 0.5 V
MDC IDC MSMT LEADCHNL RV PACING THRESHOLD PULSEWIDTH: 0.4 ms
MDC IDC MSMT LEADCHNL RV SENSING INTR AMPL: 11.8 mV
MDC IDC PG SERIAL: 7338769
MDC IDC SESS DTM: 20160914131537
MDC IDC SET LEADCHNL RV PACING AMPLITUDE: 0.75 V
MDC IDC SET LEADCHNL RV PACING PULSEWIDTH: 0.4 ms
MDC IDC STAT BRADY AP VS PERCENT: 1 %

## 2015-08-18 ENCOUNTER — Ambulatory Visit: Payer: PRIVATE HEALTH INSURANCE | Admitting: Cardiology

## 2015-08-19 ENCOUNTER — Other Ambulatory Visit: Payer: Self-pay

## 2015-08-19 DIAGNOSIS — Z1231 Encounter for screening mammogram for malignant neoplasm of breast: Secondary | ICD-10-CM

## 2015-08-20 ENCOUNTER — Ambulatory Visit (INDEPENDENT_AMBULATORY_CARE_PROVIDER_SITE_OTHER): Payer: Medicare Other | Admitting: Cardiology

## 2015-08-20 ENCOUNTER — Encounter: Payer: Self-pay | Admitting: Cardiology

## 2015-08-20 VITALS — BP 132/84 | HR 89 | Ht 62.0 in | Wt 215.0 lb

## 2015-08-20 DIAGNOSIS — I10 Essential (primary) hypertension: Secondary | ICD-10-CM

## 2015-08-20 DIAGNOSIS — I447 Left bundle-branch block, unspecified: Secondary | ICD-10-CM | POA: Diagnosis not present

## 2015-08-20 DIAGNOSIS — E669 Obesity, unspecified: Secondary | ICD-10-CM

## 2015-08-20 DIAGNOSIS — R001 Bradycardia, unspecified: Secondary | ICD-10-CM | POA: Diagnosis not present

## 2015-08-20 DIAGNOSIS — Z950 Presence of cardiac pacemaker: Secondary | ICD-10-CM | POA: Diagnosis not present

## 2015-08-20 NOTE — Patient Instructions (Signed)
Medication Instructions:  The current medical regimen is effective;  continue present plan and medications.  Follow-Up: Follow up in 1 year with Dr. Skains.  You will receive a letter in the mail 2 months before you are due.  Please call us when you receive this letter to schedule your follow up appointment.  Thank you for choosing [facility]!!     

## 2015-08-20 NOTE — Progress Notes (Signed)
[place]. [address] [place], [place]  [address] Phone: [phone] Fax:  [phone]  Date:  08/20/2015   ID:  Melissa Novak, DOB Nov 04, 1934, MRN [id number]  PCP:  Mathews Argyle, MD   History of Present Illness: Melissa Novak is a 81 y.o. female here for follow-up, pacemaker, edema, hypertension, obesity. Previously had pauses up to 6 seconds and had pacemaker placed.  Weight has steadily been going up. She is going to the [facility], physical trainer. She states that Medicare will pay for 8 weeks of this. She is trying to avoid carbohydrates.  Hypertension-she is having intermittent swelling but this seems to be improved on lower dose amlodipine. On losartan as well. Dr. Felipa Eth has discussed this with her. Her blood pressure is better today.  Denies any syncope, bleeding, chest pain. Has minor cough even on losartan. Pacemaker functioning well. Dr. Lovena Le.  She does have knee osteoarthritis. No chest pain, no shortness of breath.  Wt Readings from Last 3 Encounters:  08/20/15 215 lb (97.523 kg)  01/21/15 214 lb 9.6 oz (97.342 kg)  11/25/14 218 lb (98.884 kg)     Past Medical History  Diagnosis Date  . Diverticulosis   . Hypertension   . Prolapse of anterior vaginal wall   . History of shingles 2004 --  EYES  , NO RESIDUAL PAIN  . History of pulmonary embolism 1970'S  . Coronary artery disease CARDIOLOGIST- DR Gillian Shields-  LAST VISIT THIS WEEK--  WILL REQUEST NOTE, STRESS TEST     PT DENIES S & S  . PONV (postoperative nausea and vomiting)     pt states no n/v w 4/13 surgery  . Pacemaker 4/13    DDD St Jude  . Cough     pt states has had cough  5-7 weeks. pcp aware    Past Surgical History  Procedure Laterality Date  . Cholecystectomy  1967  . Vaginal hysterectomy  1974    STILL HAS OVARIES  . Cataract extraction w/ intraocular lens  implant, bilateral    . Transthoracic echocardiogram  02-15-2012  DR SKAINS    NORMAL EF 60-65%/ MILD AORTIC  SCLEROSIS, NO STENOSIS, GRADE I DIASTOLIC DYSFUNCTION  . Cardiovascular stress test  02-14-2012  DR SKAINS    LOW RISK NUCLEAR TEST/ MILD FIXED DEFECT ALONG THE DISTAL ANTERIOR SEPTAL WALL CONSTISTENT WITH LBBB/ NO ISCHEMIA   . Pubovaginal sling  02/24/2012    Procedure: Gaynelle Arabian;  Surgeon: Ailene Rud, MD;  Location: [facility];  Service: Urology;  Laterality: N/A;  [employer] ANTERIOR VAULT REPAIR OWER   . Breast surgery      biopsy  . Insert / replace / remove pacemaker  4/13    ddd St Jude pacer,d/t symptomatic bradycardia w intermittent complete heart block.  Inserted By Dr. Crissie Sickles 4/13  . Pubovaginal sling  12/07/2012    Procedure: Gaynelle Arabian;  Surgeon: Ailene Rud, MD;  Location: [facility];  Service: Urology;  Laterality: N/A;  solyx sling   . Permanent pacemaker insertion N/A 02/27/2012    Procedure: PERMANENT PACEMAKER INSERTION;  Surgeon: Evans Lance, MD;  Location: [facility] C[facility] LAB;  Service: Cardiovascular;  Laterality: N/A;    Current Outpatient Prescriptions  Medication Sig Dispense Refill  . amLODipine (NORVASC) 2.5 MG tablet Take 2.5 mg by mouth daily.    . Ascorbic Acid (VITAMIN C) 1000 MG tablet Take 1,000 mg by mouth daily.    Marland Kitchen  aspirin 81 MG tablet Take 81 mg by mouth daily.    . calcium carbonate (OS-CAL) 600 MG TABS tablet Take 600 mg by mouth 2 (two) times daily with a meal.    . Cholecalciferol (VITAMIN D PO) Take 1 tablet by mouth daily.    Marland Kitchen FEMRING 0.1 MG/24HR RING Place 1 each vaginally See admin instructions. Change every 90 days    . Flaxseed, Linseed, (FLAX SEED OIL PO) Take 1 capsule by mouth daily.     . furosemide (LASIX) 20 MG tablet Take 20 mg by mouth daily.    Marland Kitchen GARLIC PO Take 1 tablet by mouth daily.     Marland Kitchen losartan (COZAAR) 50 MG tablet Take 50 mg by mouth 2 (two) times daily.    . Misc Natural Products (OSTEO BI-FLEX JOINT SHIELD PO) Take 1 tablet by mouth daily.      . Multiple Vitamin (MULTIVITAMIN WITH MINERALS) TABS Take 1 tablet by mouth daily.    . Multiple Vitamins-Minerals (ZINC PO) Take 1 tablet by mouth daily.     . Omega-3 Fatty Acids (FISH OIL PO) Take 1 capsule by mouth daily.     . valACYclovir (VALTREX) 1000 MG tablet Take 1,000 mg by mouth daily.     No current facility-administered medications for this visit.    Allergies:    Allergies  Allergen Reactions  . Sulfa Antibiotics Anaphylaxis  . Dilaudid [Hydromorphone Hcl] Nausea And Vomiting  . Percocet [Oxycodone-Acetaminophen] Other (See Comments)    Patient doesn't remember the reaction but says she is allergic  . Tramadol Hives, Nausea Only and Other (See Comments)    Blurred vision  . Benadryl [Diphenhydramine Hcl] Rash and Other (See Comments)    BLURRED VISION  . Cephalexin Rash  . Codeine Rash  . Demerol Nausea Only and Rash  . Dimenhydrinate Rash and Other (See Comments)    BLURRED VISION  . Meclizine Hcl Rash and Other (See Comments)    BLURRED VISION  . Penicillins Rash and Other (See Comments)    AND FEVER  . Pentazocine Lactate Rash and Other (See Comments)    AND FEVER    Social History:  The patient  reports that she has quit smoking. She has never used smokeless tobacco. She reports that she drinks alcohol. She reports that she does not use illicit drugs.   ROS:  Please see the history of present illness.   No fevers, no syncope, no orthopnea, no PND   PHYSICAL EXAM: VS:  BP 132/84 mmHg  Pulse 89  Ht 5\' 2"  (1.575 m)  Wt 215 lb (97.523 kg)  BMI 39.31 kg/m2  SpO2 92% Well nourished, well developed, in no acute distress HEENT: normal Neck: no JVD Cardiac:  normal S1, S2; RRR; no murmur Lungs:  clear to auscultation bilaterally, no wheezing, rhonchi or rales Abd: soft, nontender, no hepatomegaly Ext: improvededema Skin: warm and dry Neuro: no focal abnormalities noted  EKG:  11/25/14, heart rate 90 Sinus rhythm, first-degree AV block, left bundle  branch block Sinus rhythm, 86, left anterior fascicular block, intraventricular conduction delay.     ASSESSMENT AND PLAN:  1. Edema-intermittent even with low-dose amlodipine but today it is improved once again. Losartan. Continue with furosemide. Doing well. Has not needed compression hose. 2. Left bundle branch block-no changes made. Interventricular conduction delay. Pacemaker for backup. 3. HTN - controlled. Excellent BP. Dr. Carlyle Lipa last visit 810 systolic. 4. Obesity-encourage weight loss. She is avoiding "whites " 5. Pacemaker-functioning well. Dr. Lovena Le,  St. Jude 6. Knee pain-Dr. Tonita Cong. Done with injections.  She cannot have an MRI because of her pacemaker. 7. One-year follow-up  Signed, Candee Furbish, MD [facility]  08/20/2015 2:15 PM

## 2015-08-28 ENCOUNTER — Encounter: Payer: Self-pay | Admitting: Cardiology

## 2015-09-02 DIAGNOSIS — R7309 Other abnormal glucose: Secondary | ICD-10-CM | POA: Diagnosis not present

## 2015-09-02 DIAGNOSIS — Z0000 Encounter for general adult medical examination without abnormal findings: Secondary | ICD-10-CM | POA: Diagnosis not present

## 2015-09-02 DIAGNOSIS — I10 Essential (primary) hypertension: Secondary | ICD-10-CM | POA: Diagnosis not present

## 2015-09-07 ENCOUNTER — Encounter: Payer: Self-pay | Admitting: Internal Medicine

## 2015-09-21 ENCOUNTER — Ambulatory Visit: Payer: Medicare Other

## 2015-09-22 DIAGNOSIS — M25562 Pain in left knee: Secondary | ICD-10-CM | POA: Diagnosis not present

## 2015-09-22 DIAGNOSIS — M25561 Pain in right knee: Secondary | ICD-10-CM | POA: Diagnosis not present

## 2015-10-02 DIAGNOSIS — N952 Postmenopausal atrophic vaginitis: Secondary | ICD-10-CM | POA: Diagnosis not present

## 2015-10-05 DIAGNOSIS — Z23 Encounter for immunization: Secondary | ICD-10-CM | POA: Diagnosis not present

## 2015-10-28 ENCOUNTER — Ambulatory Visit
Admission: RE | Admit: 2015-10-28 | Discharge: 2015-10-28 | Disposition: A | Discharge status: Home / Self Care | Payer: Medicare Other | Source: Ambulatory Visit

## 2015-10-28 DIAGNOSIS — Z1231 Encounter for screening mammogram for malignant neoplasm of breast: Secondary | ICD-10-CM

## 2015-11-04 ENCOUNTER — Ambulatory Visit (INDEPENDENT_AMBULATORY_CARE_PROVIDER_SITE_OTHER): Payer: Medicare Other | Admitting: *Deleted

## 2015-11-04 DIAGNOSIS — R001 Bradycardia, unspecified: Secondary | ICD-10-CM

## 2015-11-04 NOTE — Progress Notes (Signed)
Remote pacemaker transmission.   

## 2015-11-09 LAB — CUP PACEART REMOTE DEVICE CHECK
Battery Remaining Longevity: 101 mo
Battery Remaining Percentage: 81 %
Battery Voltage: 2.93 V
Brady Statistic AP VS Percent: 1 %
Brady Statistic AS VP Percent: 99 %
Brady Statistic AS VS Percent: 1 %
Brady Statistic RA Percent Paced: 1 %
Implantable Lead Implant Date: 20130408
Implantable Lead Implant Date: 20130408
Implantable Lead Location: 753859
Lead Channel Setting Pacing Amplitude: 0.75 V
Lead Channel Setting Pacing Amplitude: 1.875
Lead Channel Setting Pacing Pulse Width: 0.4 ms
MDC IDC LEAD LOCATION: 753860
MDC IDC MSMT LEADCHNL RA PACING THRESHOLD AMPLITUDE: 0.875 V
MDC IDC MSMT LEADCHNL RA PACING THRESHOLD PULSEWIDTH: 0.4 ms
MDC IDC MSMT LEADCHNL RV PACING THRESHOLD AMPLITUDE: 0.5 V
MDC IDC MSMT LEADCHNL RV PACING THRESHOLD PULSEWIDTH: 0.4 ms
MDC IDC SESS DTM: 20161214125941
MDC IDC SET LEADCHNL RV SENSING SENSITIVITY: 4 mV
MDC IDC STAT BRADY AP VP PERCENT: 1 %
MDC IDC STAT BRADY RV PERCENT PACED: 99 %
Pulse Gen Model: 2210
Pulse Gen Serial Number: 7338769

## 2015-11-12 ENCOUNTER — Encounter: Payer: Self-pay | Admitting: *Deleted

## 2015-12-08 DIAGNOSIS — L853 Xerosis cutis: Secondary | ICD-10-CM | POA: Diagnosis not present

## 2015-12-08 DIAGNOSIS — I10 Essential (primary) hypertension: Secondary | ICD-10-CM | POA: Diagnosis not present

## 2015-12-08 DIAGNOSIS — R7302 Impaired glucose tolerance (oral): Secondary | ICD-10-CM | POA: Diagnosis not present

## 2015-12-08 DIAGNOSIS — Z6839 Body mass index (BMI) 39.0-39.9, adult: Secondary | ICD-10-CM | POA: Diagnosis not present

## 2015-12-08 DIAGNOSIS — R7303 Prediabetes: Secondary | ICD-10-CM | POA: Diagnosis not present

## 2015-12-08 DIAGNOSIS — E669 Obesity, unspecified: Secondary | ICD-10-CM | POA: Diagnosis not present

## 2015-12-31 DIAGNOSIS — N952 Postmenopausal atrophic vaginitis: Secondary | ICD-10-CM | POA: Diagnosis not present

## 2016-01-20 DIAGNOSIS — C4361 Malignant melanoma of right upper limb, including shoulder: Secondary | ICD-10-CM

## 2016-01-20 HISTORY — DX: Malignant melanoma of right upper limb, including shoulder: C43.61

## 2016-02-11 ENCOUNTER — Encounter: Payer: Medicare Other | Admitting: Internal Medicine

## 2016-02-18 DIAGNOSIS — C4361 Malignant melanoma of right upper limb, including shoulder: Secondary | ICD-10-CM | POA: Diagnosis not present

## 2016-02-25 DIAGNOSIS — Z6839 Body mass index (BMI) 39.0-39.9, adult: Secondary | ICD-10-CM | POA: Diagnosis not present

## 2016-02-25 DIAGNOSIS — I498 Other specified cardiac arrhythmias: Secondary | ICD-10-CM | POA: Diagnosis not present

## 2016-02-25 DIAGNOSIS — Z950 Presence of cardiac pacemaker: Secondary | ICD-10-CM | POA: Diagnosis not present

## 2016-02-25 DIAGNOSIS — I10 Essential (primary) hypertension: Secondary | ICD-10-CM | POA: Diagnosis not present

## 2016-02-25 DIAGNOSIS — C4361 Malignant melanoma of right upper limb, including shoulder: Secondary | ICD-10-CM | POA: Diagnosis not present

## 2016-02-25 DIAGNOSIS — Z9049 Acquired absence of other specified parts of digestive tract: Secondary | ICD-10-CM | POA: Diagnosis not present

## 2016-02-29 ENCOUNTER — Encounter: Payer: Self-pay | Admitting: Oncology

## 2016-02-29 ENCOUNTER — Other Ambulatory Visit (HOSPITAL_COMMUNITY): Payer: Self-pay | Admitting: General Surgery

## 2016-02-29 ENCOUNTER — Encounter: Payer: Self-pay | Admitting: *Deleted

## 2016-02-29 ENCOUNTER — Telehealth: Payer: Self-pay | Admitting: Oncology

## 2016-02-29 DIAGNOSIS — C4361 Malignant melanoma of right upper limb, including shoulder: Secondary | ICD-10-CM

## 2016-02-29 NOTE — Telephone Encounter (Signed)
Verified address and insurance, contacted referring office, mailed new pt packet, scheduled in take. °

## 2016-03-02 ENCOUNTER — Ambulatory Visit (HOSPITAL_COMMUNITY): Payer: Medicare Other

## 2016-03-04 ENCOUNTER — Ambulatory Visit (HOSPITAL_COMMUNITY)
Admission: RE | Admit: 2016-03-04 | Discharge: 2016-03-04 | Disposition: A | Discharge status: Home / Self Care | Payer: Medicare Other | Source: Ambulatory Visit | Attending: General Surgery | Admitting: General Surgery

## 2016-03-04 DIAGNOSIS — C4361 Malignant melanoma of right upper limb, including shoulder: Secondary | ICD-10-CM | POA: Diagnosis present

## 2016-03-04 MED ORDER — TECHNETIUM TC 99M SULFUR COLLOID FILTERED: 0.5000 | Freq: Once | INTRAVENOUS | Status: DC | PRN

## 2016-03-08 ENCOUNTER — Ambulatory Visit (INDEPENDENT_AMBULATORY_CARE_PROVIDER_SITE_OTHER): Payer: Medicare Other | Admitting: Internal Medicine

## 2016-03-08 ENCOUNTER — Encounter: Payer: Self-pay | Admitting: Internal Medicine

## 2016-03-08 VITALS — BP 138/78 | HR 90 | Ht 63.0 in | Wt 217.0 lb

## 2016-03-08 DIAGNOSIS — I442 Atrioventricular block, complete: Secondary | ICD-10-CM | POA: Diagnosis not present

## 2016-03-08 LAB — CUP PACEART INCLINIC DEVICE CHECK
Battery Remaining Longevity: 92.4
Battery Voltage: 2.92 V
Date Time Interrogation Session: 20170418124746
Implantable Lead Implant Date: 20130408
Lead Channel Pacing Threshold Amplitude: 0.5 V
Lead Channel Pacing Threshold Pulse Width: 0.4 ms
Lead Channel Pacing Threshold Pulse Width: 0.4 ms
Lead Channel Setting Pacing Amplitude: 0.75 V
Lead Channel Setting Pacing Amplitude: 1.875
Lead Channel Setting Pacing Pulse Width: 0.4 ms
Lead Channel Setting Sensing Sensitivity: 4 mV
MDC IDC LEAD IMPLANT DT: 20130408
MDC IDC LEAD LOCATION: 753859
MDC IDC LEAD LOCATION: 753860
MDC IDC MSMT LEADCHNL RA IMPEDANCE VALUE: 387.5 Ohm
MDC IDC MSMT LEADCHNL RA PACING THRESHOLD AMPLITUDE: 0.875 V
MDC IDC MSMT LEADCHNL RA SENSING INTR AMPL: 5 mV
MDC IDC MSMT LEADCHNL RV IMPEDANCE VALUE: 450 Ohm
MDC IDC MSMT LEADCHNL RV SENSING INTR AMPL: 12 mV
MDC IDC PG SERIAL: 7338769
MDC IDC STAT BRADY RA PERCENT PACED: 0.08 %
MDC IDC STAT BRADY RV PERCENT PACED: 99.94 %

## 2016-03-08 NOTE — Progress Notes (Signed)
HPI Melissa Novak returns today for followup. She is a pleasant 82 year old woman with a history of complete heart block, status post permanent pacemaker insertion, hypertension,  who returns today for PPM followup. She denies chest pain or shortness of breath. No fevers and chills. She has very minimal peripheral edema. No syncope.  No palpitations. She has been diagnosed with melenoma and is pending removal and lymph node dissection. Allergies  Allergen Reactions  . Sulfa Antibiotics Anaphylaxis  . Dilaudid [Hydromorphone Hcl] Nausea And Vomiting  . Percocet [Oxycodone-Acetaminophen] Other (See Comments)    Patient doesn't remember the reaction but says she is allergic  . Tramadol Hives, Nausea Only and Other (See Comments)    Blurred vision  . Benadryl [Diphenhydramine Hcl] Rash and Other (See Comments)    BLURRED VISION  . Cephalexin Rash  . Codeine Rash  . Demerol Nausea Only and Rash  . Dimenhydrinate Rash and Other (See Comments)    BLURRED VISION  . Meclizine Hcl Rash and Other (See Comments)    BLURRED VISION  . Penicillins Rash and Other (See Comments)    AND FEVER  . Pentazocine Lactate Rash and Other (See Comments)    AND FEVER     Current Outpatient Prescriptions  Medication Sig Dispense Refill  . amLODipine (NORVASC) 2.5 MG tablet Take 2.5 mg by mouth daily.    . Ascorbic Acid (VITAMIN C) 1000 MG tablet Take 1,000 mg by mouth daily.    Marland Kitchen aspirin 81 MG tablet Take 81 mg by mouth daily.    . calcium carbonate (OS-CAL) 600 MG TABS tablet Take 600 mg by mouth 2 (two) times daily with a meal.    . Cholecalciferol (VITAMIN D PO) Take 1 tablet by mouth daily.    Marland Kitchen FEMRING 0.1 MG/24HR RING Place 1 each vaginally See admin instructions. Change every 90 days    . Flaxseed, Linseed, (FLAX SEED OIL PO) Take 1 capsule by mouth daily.     . furosemide (LASIX) 20 MG tablet Take 20 mg by mouth daily.    Marland Kitchen GARLIC PO Take 1 tablet by mouth daily.     Marland Kitchen losartan (COZAAR) 50  MG tablet Take 50 mg by mouth 2 (two) times daily.    . Misc Natural Products (OSTEO BI-FLEX JOINT SHIELD PO) Take 1 tablet by mouth daily.     . Multiple Vitamin (MULTIVITAMIN WITH MINERALS) TABS Take 1 tablet by mouth daily.    . Multiple Vitamins-Minerals (ZINC PO) Take 1 tablet by mouth daily.     . Omega-3 Fatty Acids (FISH OIL PO) Take 1 capsule by mouth daily.     . valACYclovir (VALTREX) 1000 MG tablet Take 1,000 mg by mouth daily.     No current facility-administered medications for this visit.   Facility-Administered Medications Ordered in Other Visits  Medication Dose Route Frequency Provider Last Rate Last Dose  . technetium sulfur colloid (NYCOMED-[place]) filtered injection solution 0.5 milli Curie  0.5 milli Curie Intradermal Once PRN Fanny Skates, MD         Past Medical History  Diagnosis Date  . Diverticulosis   . Hypertension   . Prolapse of anterior vaginal wall   . History of shingles 2004 --  EYES  , NO RESIDUAL PAIN  . History of pulmonary embolism 1970'S  . Coronary artery disease CARDIOLOGIST- DR Gillian Shields-  LAST VISIT THIS WEEK--  WILL REQUEST NOTE, STRESS TEST     PT DENIES S & S  .  PONV (postoperative nausea and vomiting)     pt states no n/v w 4/13 surgery  . Pacemaker 4/13    DDD St Jude  . Cough     pt states has had cough  5-7 weeks. pcp aware    ROS:   All systems reviewed and negative except as noted in the HPI.   Past Surgical History  Procedure Laterality Date  . Cholecystectomy  1967  . Vaginal hysterectomy  1974    STILL HAS OVARIES  . Cataract extraction w/ intraocular lens  implant, bilateral    . Transthoracic echocardiogram  02-15-2012  DR SKAINS    NORMAL EF 60-65%/ MILD AORTIC SCLEROSIS, NO STENOSIS, GRADE I DIASTOLIC DYSFUNCTION  . Cardiovascular stress test  02-14-2012  DR SKAINS    LOW RISK NUCLEAR TEST/ MILD FIXED DEFECT ALONG THE DISTAL ANTERIOR SEPTAL WALL CONSTISTENT WITH LBBB/ NO ISCHEMIA   . 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 Breast surgery      biopsy  . Insert / replace / remove pacemaker  4/13    ddd St Jude pacer,d/t symptomatic bradycardia w intermittent complete heart block.  Inserted By Dr. Crissie Sickles 4/13  . Pubovaginal sling  12/07/2012    Procedure: Gaynelle Arabian;  Surgeon: Ailene Rud, MD;  Location: [facility];  Service: Urology;  Laterality: N/A;  solyx sling   . Permanent pacemaker insertion N/A 02/27/2012    Procedure: PERMANENT PACEMAKER INSERTION;  Surgeon: Evans Lance, MD;  Location: [facility] CATH LAB;  Service: Cardiovascular;  Laterality: N/A;     Family History  Problem Relation Age of Onset  . Kidney disease Son   . Cancer Mother   . Cancer Father   . Cancer Brother   . Kidney disease Brother      Social History   Social History  . Marital Status: Widowed    Spouse Name: N/A  . Number of Children: N/A  . Years of Education: N/A   Occupational History  . Not on file.   Social History Main Topics  . Smoking status: Former Research scientist (life sciences)  . Smokeless tobacco: Never Used  . Alcohol Use: Yes     Comment: RARE  . Drug Use: No  . Sexual Activity: Not on file   Other Topics Concern  . Not on file   Social History Narrative     BP 138/78 mmHg  Pulse 90  Ht 5\' 3"  (1.6 m)  Wt 217 lb (98.431 kg)  BMI 38.45 kg/m2  Physical Exam:  obese appearing 82 yo woman, NAD HEENT: Unremarkable Neck:  7 cm JVD, no thyromegally Back:  No CVA tenderness Lungs:  Clear with no wheezes, rales, or rhonchi. HEART:  Regular rate rhythm, no murmurs, no rubs, no clicks Abd:  soft, positive bowel sounds, no organomegally, no rebound, no guarding Ext:  2 plus pulses, trace peripheral edema, no cyanosis, no clubbing, right arm with 5 mm lesion on forearm Skin:  No rashes  no nodules Neuro:  CN II through XII intact, motor grossly intact   DEVICE  Normal device function.  See PaceArt for details.   Assess/Plan: 1. Complete heart block - she is s/p PPM insertion and asymptomatic. 2. HTN - her blood pressure is a bit elevated. She is encouraged to reduce her  salt intake and lose weight 3. PPM - her St. Jude DDD PM is working normally. Will follow. 4. Preop - she is low risk for cardiovascular complications from pending melanoma surgery.  Mikle Bosworth.D.

## 2016-03-08 NOTE — Patient Instructions (Signed)
Medication Instructions:  Your physician recommends that you continue on your current medications as directed. Please refer to the Current Medication list given to you today.   Labwork: None ordered   Testing/Procedures: None ordered   Follow-Up: Your physician wants you to follow-up in: 12 months with Dr Knox Saliva will receive a reminder letter in the mail two months in advance. If you don't receive a letter, please call our office to schedule the follow-up appointment.  Remote monitoring is used to monitor your Pacemaker  from home. This monitoring reduces the number of office visits required to check your device to one time per year. It allows [place] to keep an eye on the functioning of your device to ensure it is working properly. You are scheduled for a device check from home on 06/07/16. You may send your transmission at any time that day. If you have a wireless device, the transmission will be sent automatically. After your physician reviews your transmission, you will receive a postcard with your next transmission date.    Any Other Special Instructions Will Be Listed Below (If Applicable).     If you need a refill on your cardiac medications before your next appointment, please call your pharmacy.

## 2016-03-11 ENCOUNTER — Ambulatory Visit (HOSPITAL_BASED_OUTPATIENT_CLINIC_OR_DEPARTMENT_OTHER): Payer: Medicare Other | Admitting: Oncology

## 2016-03-11 ENCOUNTER — Telehealth: Payer: Self-pay | Admitting: Oncology

## 2016-03-11 VITALS — BP 140/78 | HR 93 | Temp 97.7°F | Resp 18 | Ht 63.0 in | Wt 216.2 lb

## 2016-03-11 DIAGNOSIS — C4361 Malignant melanoma of right upper limb, including shoulder: Secondary | ICD-10-CM

## 2016-03-11 DIAGNOSIS — C439 Malignant melanoma of skin, unspecified: Secondary | ICD-10-CM

## 2016-03-11 NOTE — Progress Notes (Signed)
Please see consult note.  

## 2016-03-11 NOTE — Telephone Encounter (Signed)
Gave pt appt for may & avs °

## 2016-03-11 NOTE — Consult Note (Signed)
Reason for Referral: Melanoma.   HPI: 82 year old woman currently of [place] where she lived the majority of her life. He is a pleasant woman with history of hypertension as well as cardiac arrhythmia with pacemaker in place. She has a long history of skin lesion noted on her right forearm. This have been monitored for years and subsequently she was instructed due to report any changes in the size or shape of that lesion. She did report some changes and was evaluated by Dr. Pablo Ledger at [facility] dermatology and subsequently underwent a shave biopsy on 02/18/2016. The biopsy showed malignant melanoma measuring 4.5 mm thickness with ulceration. Lymphovascular invasion was not identified. She subsequently was evaluated by Dr. Dalbert Batman at [facility] surgery for possible wide excision. She did not have any clinical lymph nodes per his examination. She is asymptomatic otherwise. She has not reported any decline in her performance status or activity level. She continues to be independent and attends to activities of daily living. She has not reported any fatigue or tiredness. Has not reported any other skin lesions. Has not reported any constitutional symptoms or weight loss or appetite changes.  She does not report any headaches, vision, syncope or seizures. She does not report any fevers, chills sweats. Does not report any cough, wheezing or hemoptysis. Does not report any nausea, vomiting or abdominal pain. Does not report any chest pain, palpitation orthopnea. Does not report any cough, wheezing or hemoptysis. Does not report any nausea, vomiting or abdominal pain. Does not report any gross satiety or change in her bowel habits. She does not report any frequency urgency or hesitancy. Remaining review of system is unremarkable.   Past Medical History  Diagnosis Date  . Diverticulosis   . Hypertension   . Prolapse of anterior vaginal wall   . History of shingles 2004 --  EYES  , NO RESIDUAL PAIN  .  History of pulmonary embolism 1970'S  . Coronary artery disease CARDIOLOGIST- DR Gillian Shields-  LAST VISIT THIS WEEK--  WILL REQUEST NOTE, STRESS TEST     PT DENIES S & S  . PONV (postoperative nausea and vomiting)     pt states no n/v w 4/13 surgery  . Pacemaker 4/13    DDD St Jude  . Cough     pt states has had cough  5-7 weeks. pcp aware  :  Past Surgical History  Procedure Laterality Date  . Cholecystectomy  1967  . Vaginal hysterectomy  1974    STILL HAS OVARIES  . Cataract extraction w/ intraocular lens  implant, bilateral    . Transthoracic echocardiogram  02-15-2012  DR SKAINS    NORMAL EF 60-65%/ MILD AORTIC SCLEROSIS, NO STENOSIS, GRADE I DIASTOLIC DYSFUNCTION  . Cardiovascular stress test  02-14-2012  DR SKAINS    LOW RISK NUCLEAR TEST/ MILD FIXED DEFECT ALONG THE DISTAL ANTERIOR SEPTAL WALL CONSTISTENT WITH LBBB/ NO ISCHEMIA   . Pubovaginal sling  02/24/2012    Procedure: Gaynelle Arabian;  Surgeon: Ailene Rud, MD;  Location: [facility];  Service: Urology;  Laterality: N/A;  [employer] ANTERIOR VAULT REPAIR OWER   . Breast surgery      biopsy  . Insert / replace / remove pacemaker  4/13    ddd St Jude pacer,d/t symptomatic bradycardia w intermittent complete heart block.  Inserted By Dr. Crissie Sickles 4/13  . Pubovaginal sling  12/07/2012    Procedure: Gaynelle Arabian;  Surgeon: Ailene Rud, MD;  Location: [facility];  Service: Urology;  Laterality: N/A;  solyx sling   . Permanent pacemaker insertion N/A 02/27/2012    Procedure: PERMANENT PACEMAKER INSERTION;  Surgeon: Evans Lance, MD;  Location[facility] C[facility] LAB;  Service: Cardiovascular;  Laterality: N/A;  :   Current outpatient prescriptions:  .  amLODipine (NORVASC) 2.5 MG tablet, Take 2.5 mg by mouth daily., Disp: , Rfl:  .  Ascorbic Acid (VITAMIN C) 1000 MG tablet, Take 1,000 mg by mouth daily., Disp: , Rfl:  .  aspirin 81 MG tablet, Take 81 mg by  mouth daily., Disp: , Rfl:  .  calcium carbonate (OS-CAL) 600 MG TABS tablet, Take 600 mg by mouth 2 (two) times daily with a meal., Disp: , Rfl:  .  Cholecalciferol (VITAMIN D PO), Take 1 tablet by mouth daily., Disp: , Rfl:  .  FEMRING 0.1 MG/24HR RING, Place 1 each vaginally See admin instructions. Change every 90 days, Disp: , Rfl:  .  Flaxseed, Linseed, (FLAX SEED OIL PO), Take 1 capsule by mouth daily. , Disp: , Rfl:  .  furosemide (LASIX) 20 MG tablet, Take 20 mg by mouth daily., Disp: , Rfl:  .  GARLIC PO, Take 1 tablet by mouth daily. , Disp: , Rfl:  .  losartan (COZAAR) 50 MG tablet, Take 50 mg by mouth 2 (two) times daily., Disp: , Rfl:  .  Misc Natural Products (OSTEO BI-FLEX JOINT SHIELD PO), Take 1 tablet by mouth daily. , Disp: , Rfl:  .  Multiple Vitamin (MULTIVITAMIN WITH MINERALS) TABS, Take 1 tablet by mouth daily., Disp: , Rfl:  .  Multiple Vitamins-Minerals (ZINC PO), Take 1 tablet by mouth daily. , Disp: , Rfl:  .  Omega-3 Fatty Acids (FISH OIL PO), Take 1 capsule by mouth daily. , Disp: , Rfl:  .  valACYclovir (VALTREX) 1000 MG tablet, Take 1,000 mg by mouth daily., Disp: , Rfl: :  Allergies  Allergen Reactions  . Sulfa Antibiotics Anaphylaxis  . Dilaudid [Hydromorphone Hcl] Nausea And Vomiting  . Percocet [Oxycodone-Acetaminophen] Other (See Comments)    Patient doesn't remember the reaction but says she is allergic  . Tramadol Hives, Nausea Only and Other (See Comments)    Blurred vision  . Benadryl [Diphenhydramine Hcl] Rash and Other (See Comments)    BLURRED VISION  . Cephalexin Rash  . Codeine Rash  . Demerol Nausea Only and Rash  . Dimenhydrinate Rash and Other (See Comments)    BLURRED VISION  . Meclizine Hcl Rash and Other (See Comments)    BLURRED VISION  . Penicillins Rash and Other (See Comments)    AND FEVER  . Pentazocine Lactate Rash and Other (See Comments)    AND FEVER  :  Family History  Problem Relation Age of Onset  . Kidney disease  Son   . Cancer Mother   . Cancer Father   . Cancer Brother   . Kidney disease Brother   :  Social History   Social History  . Marital Status: Widowed    Spouse Name: N/A  . Number of Children: N/A  . Years of Education: N/A   Occupational History  . Not on file.   Social History Main Topics  . Smoking status: Former Research scientist (life sciences)  . Smokeless tobacco: Never Used  . Alcohol Use: Yes     Comment: RARE  . Drug Use: No  . Sexual Activity: Not on file   Other Topics Concern  . Not on file   Social  History Narrative  :  Pertinent items are noted in HPI.  Exam: ECOG 1 Blood pressure 140/78, pulse 93, temperature 97.7 F (36.5 C), temperature source Oral, resp. rate 18, height 5\' 3"  (1.6 m), weight 216 lb 3.2 oz (98.068 kg), SpO2 95 %. General appearance: alert and cooperativeAppeared without distress. Head: Normocephalic, without obvious abnormality Throat: lips, mucosa, and tongue normal; teeth and gums normal no oral ulcers or lesions. Neck: no adenopathy Back: negative Resp: clear to auscultation bilaterally Chest wall: no tenderness Cardio: regular rate and rhythm, S1, S2 normal, no murmur, click, rub or gallop GI: soft, non-tender; bowel sounds normal; no masses,  no organomegaly Extremities: extremities normal, atraumatic, no cyanosis or edema Pulses: 2+ and symmetric Skin: Skin color, texture, turgor normal. Small pigmented lesion noted on her right forearm. Lymph nodes: No adenopathy noted in the right axilla or other areas.   CBC    Component Value Date/Time   WBC 10.1 12/06/2012 1305   RBC 4.85 12/06/2012 1305   HGB 14.6 12/07/2012 0817   HCT 43.0 12/07/2012 0817   PLT 383 12/06/2012 1305   MCV 91.5 12/06/2012 1305   MCH 31.3 12/06/2012 1305   MCHC 34.2 12/06/2012 1305   RDW 13.8 12/06/2012 1305   LYMPHSABS 2.4 08/17/2012 2313   MONOABS 0.8 08/17/2012 2313   EOSABS 0.4 08/17/2012 2313   BASOSABS 0.1 08/17/2012 2313      Nm Lymph/gland  03/04/2016   CLINICAL DATA:  Right forearm melanoma.  Sentinel node mapping. EXAM: NUCLEAR MEDICINE LYMPHANGIOGRAPHY TECHNIQUE: Sequential images were obtained following intradermal injection of radiopharmaceutical at the tumor site in the right forearm. RADIOPHARMACEUTICALS:  0.5 mCi millipore-filtered Tc-11m sulfur colloid COMPARISON:  None. FINDINGS: The injected activity remained at the injection site in the right forearm throughout 4 hours of subsequent imaging. No activity extended approximately. IMPRESSION: The injected activity stayed at the injection site in the right forearm and did not extend proximally into a sentinel node. Electronically Signed   By: Claudie Revering M.D.   On: 03/04/2016 16:57    Assessment and Plan:   82 year old woman with the following issues:  1. Malignant melanoma diagnosed on 02/18/2016. She had a lesion that had grown over the years and underwent shave biopsy which confirmed the presence of a 4.5 mm malignant melanoma with ulceration. She had positive margins and certainly will require wide excision. She underwent a nuclear medicine lymphangiography and a sentinel node was not able to be identified.  The natural course of this disease was discussed today with Melissa Novak. She certainly carries a high risk melanoma that will require wide excision and consideration for adjuvant therapy. I do agree with Dr. Dalbert Batman at a wide excision would be the next step to have a better idea of the exact depth of melanoma as well as the subtype.  The role of adjuvant immunotherapy was discussed today with Melissa Novak and her daughter. The only approved adjuvant therapy at this time is Ipilimumab and interferon. The drug of choice at this time would be Ipilimumab given the questionable data associated with interferon. Risks and benefits of this infusion were discussed today in detail. Complications include immune-related side effects including gastroenteritis, thyroiditis, pneumonitis as well as  dermatitis. After discussion today, despite her reasonable health and shape the risks associated with this therapy does not justify the potential benefit at this time.  Based on the discussion today, I have recommended wide excision only without sentinel lymph node sampling and follow up clinically.  She  will require continued active surveillance and I will schedule her for a follow-up after her surgery to discuss the final pathology and a surveillance program.  2. Follow-up: This will be scheduled for end of May after she have recovered from her operation.

## 2016-03-14 ENCOUNTER — Other Ambulatory Visit: Payer: Self-pay | Admitting: General Surgery

## 2016-03-16 ENCOUNTER — Encounter (HOSPITAL_COMMUNITY): Payer: Self-pay | Admitting: Emergency Medicine

## 2016-03-16 NOTE — Progress Notes (Signed)
Anesthesia Chart Review:  Pt is an 82 year old female scheduled for wide excision of melanoma R forearm on 03/17/2016 with Dr. Dalbert Batman.   EP cardiologist is Dr. Cristopher Peru who has cleared pt for surgery. Cardiologist is Dr. Candee Furbish who has also cleared pt for surgery (in media tab). PCP is Dr. Lajean Manes.   Pt is a same day work up.   PMH includes:  CAD, complete heart block, pacemaker (St. Jude, last pacer check 03/08/16), HTN, LBBB, post-op N/V. Former smoker. BMI 38. S/p pubo-vaginal sling 12/07/12 and 02/24/12.   Medications includes: amlodipine, ASA, lasix, losartan  Labs will be obtained DOS.   EKG 03/08/16: NSR. LAD. LBBB.  Echo 02/14/12 (correspondence 03/01/12 in media tab):  - Mild concentric LVH - EF 60-65% - No regional wall motion abnormalities. Mildly asynchronous contraction consistent with LBBB - Borderline LA enlargement - Trace MR - Trivial TR - Mild calcification of aortic valve - Grade Ia diastolic dysfunction with elevated LA pressure  Nuclear stress test 02/14/12 (correspondence 03/01/12 in media tab):  - Low risk nuclear stress test. Mild fixed defect along the distal anterior septal wall consistent with LBBB. Normal EF. Overall reassuring. No ischemia identified.   If labs acceptable DOS, I anticipate pt can proceed as scheduled.   Willeen Cass, FNP-BC [facility]/Anesthesiology Phone: [phone] 03/16/2016 9:43 AM

## 2016-03-16 NOTE — H&P (Signed)
Kathrin Ruddy  Location: [facility] Surgery Patient #: [id number] DOB: 27-Apr-1934 Widowed / Language: Cleophus Molt / Race: White Female       History of Present Illness  The patient is a 82 year old female who presents for a melanoma biopsy follow-up. Ms. Olshansky returns for a preop discussion. She is scheduled for wide local excision of her right forearm melanoma this coming Thursday, 48 hours from now.  Her shave biopsy performed by her dermatologist reveals malignant melanoma, 4.5 mm thickness, anatomic level IV, ulceration present, peripheral and deep margins involved. Mitotic index 6/mm. Lymphovascular invasion absent. High risk. This biopsy was performed on February 18, 2016. This is her first melanoma.  Because she is pacemaker dependent, she has seen her cardiologist Dr. Cristopher Peru who says that we can go ahead with surgery but wants to do this at [facility] and we will notify him of the date.  She has seen Dr. Alen Blew and both she and Dr. Alen Blew feel that adjuvant immunotherapy would not be appropriate nor would it be tolerated, so all she needs wide excision of the melanoma. There does not appear to be any indication for CT or PET scan at this time. We did a lymphoscintigram which did not map to any nodes.  I discussed the indications, details, techniques, numerous risk of the surgery with her and her daughter. I told her that we will either do a lazy -S or O-to-Z closure. Risk of bleeding, infection, nerve damage with chronic pain or numbness, wound healing failure, second operation if margins are 5 positive requiring skin graft were all discussed. She understands all these issues. All of her questions were answered. She agrees with this plan.   Allergies  SulfADIAZINE *SULFONAMIDES* Dilaudid-HP *ANALGESICS - OPIOID* Percocet *ANALGESICS - OPIOID* TraMADol HCl *CHEMICALS* Benadryl *ANTIHISTAMINES* Cephalexin *CEPHALOSPORINS* Codeine Sulfate  *ANALGESICS - OPIOID* Demerol *ANALGESICS - OPIOID* DimenhyDRINATE *ANTIEMETICS* Meclizine HCl *ANTIEMETICS* Penicillins Pentazocine No Known Drug Allergies(Marked as Inactive)  Medication History  Furosemide (20MG  Tablet, Oral) Active. AmLODIPine Besylate (2.5MG  Tablet, Oral) Active. ValACYclovir HCl (1GM Tablet, Oral) Active. Losartan Potassium (50MG  Tablet, Oral) Active. Medications Reconciled  Vitals  Weight: 218 lb Height: 62in Body Surface Area: 1.98 m Body Mass Index: 39.87 kg/m  Temp.: 98.25F  Pulse: 57 (Regular)  BP: 130/78 (Sitting, Left Arm, Standard)       Physical Exam  General Mental Status-Alert. General Appearance-Not in acute distress. Build & Nutrition-Well nourished. Posture-Normal posture. Gait-Normal.  Integumentary Note: Dorsum of mid right forearm reveals a nodular pigmented lesion about 5 mm diameter and also ulcerated area adjacent to that consistent with recent shave biopsy. No axillary adenopathy. No satellite nodules.   Head and Neck Head-normocephalic, atraumatic with no lesions or palpable masses. Trachea-midline. Thyroid Gland Characteristics - normal size and consistency and no palpable nodules.  Chest and Lung Exam Chest and lung exam reveals -on auscultation, normal breath sounds, no adventitious sounds and normal vocal resonance. Note: Pacemaker left infraclavicular area   Cardiovascular Cardiovascular examination reveals -normal heart sounds, regular rate and rhythm with no murmurs and femoral artery auscultation bilaterally reveals normal pulses, no bruits, no thrills.  Abdomen Inspection Inspection of the abdomen reveals - No Hernias. Palpation/Percussion Palpation and Percussion of the abdomen reveal - Soft, Non Tender, No Rigidity (guarding), No hepatosplenomegaly and No Palpable abdominal masses. Note: Healed right subcostal incision   Neurologic Neurologic evaluation  reveals -alert and oriented x 3 with no impairment of recent or remote memory, normal attention span and ability  to concentrate, normal sensation and normal coordination.  Musculoskeletal Normal Exam - Bilateral-Upper Extremity Strength Normal and Lower Extremity Strength Normal.    Assessment & Plan  MELANOMA OF FOREARM, RIGHT (C43.61)  You have seen Dr. Cristopher Peru and he has cleared you for surgery. You have seen Dr. Alen Blew and he does not plan any adjuvant immunotherapy, so all that you need his wide local excision of the melanoma. You do not need a lymph node biopsy.  You are scheduled for wide local excision of the right forearm melanoma this coming Thursday. I discussed the indications, techniques, and risks of the surgery with you and your daughter in detail. My office will explain the preoperative process. We will notify Dr. Cristopher Peru of the surgery date and we will do the surgery at [facility] because of your pacemaker.   PACEMAKER-DEPENDENT DUE TO NATIVE CARDIAC RHYTHM INSUFFICIENT TO SUPPORT LIFE (I49.8) HISTORY OF CHOLECYSTECTOMY (Z90.49) HYPERTENSION, BENIGN (I10) BMI 39.0-39.9,ADULT (Z68.39)   Edsel Petrin. Dalbert Batman, M.D., [facility] General and Minimally invasive Surgery Breast and Colorectal Surgery Office:   [phone] Pager:   [phone]

## 2016-03-16 NOTE — Progress Notes (Signed)
Pt denies SOB and chest pain. Pt made aware to stop taking Aspirin, vitamins, fish oil, Flax seed, Garlic, Bio- Flex and herbal medications. Do not take any NSAIDs ie: Ibuprofen, Advil, Naproxen, BC and Goody Powder or any medication containing Aspirin. Pt verbalized understanding of all pre-op instructions. Willeen Cass, NP, asked to review pt history( see note). Spoke with Dr. Dalbert Batman regarding warning for use of Ancef with allergy to Cephalosporins and Penicillin; MD advised that RN review order set.

## 2016-03-17 ENCOUNTER — Encounter (HOSPITAL_COMMUNITY)
Admission: RE | Disposition: A | Discharge status: Home / Self Care | Payer: Self-pay | Source: Ambulatory Visit | Attending: General Surgery

## 2016-03-17 ENCOUNTER — Ambulatory Visit (HOSPITAL_COMMUNITY): Payer: Medicare Other | Admitting: Emergency Medicine

## 2016-03-17 ENCOUNTER — Encounter (HOSPITAL_COMMUNITY): Payer: Self-pay | Admitting: *Deleted

## 2016-03-17 ENCOUNTER — Ambulatory Visit (HOSPITAL_COMMUNITY)
Admission: RE | Admit: 2016-03-17 | Discharge: 2016-03-17 | Disposition: A | Discharge status: Home / Self Care | Payer: Medicare Other | Source: Ambulatory Visit | Attending: General Surgery | Admitting: General Surgery

## 2016-03-17 DIAGNOSIS — I2510 Atherosclerotic heart disease of native coronary artery without angina pectoris: Secondary | ICD-10-CM | POA: Diagnosis not present

## 2016-03-17 DIAGNOSIS — I447 Left bundle-branch block, unspecified: Secondary | ICD-10-CM | POA: Insufficient documentation

## 2016-03-17 DIAGNOSIS — Z6839 Body mass index (BMI) 39.0-39.9, adult: Secondary | ICD-10-CM | POA: Insufficient documentation

## 2016-03-17 DIAGNOSIS — Z7982 Long term (current) use of aspirin: Secondary | ICD-10-CM | POA: Insufficient documentation

## 2016-03-17 DIAGNOSIS — E669 Obesity, unspecified: Secondary | ICD-10-CM | POA: Diagnosis not present

## 2016-03-17 DIAGNOSIS — C4361 Malignant melanoma of right upper limb, including shoulder: Secondary | ICD-10-CM | POA: Diagnosis present

## 2016-03-17 DIAGNOSIS — Z950 Presence of cardiac pacemaker: Secondary | ICD-10-CM | POA: Insufficient documentation

## 2016-03-17 DIAGNOSIS — Z87891 Personal history of nicotine dependence: Secondary | ICD-10-CM | POA: Diagnosis not present

## 2016-03-17 DIAGNOSIS — Z79899 Other long term (current) drug therapy: Secondary | ICD-10-CM | POA: Diagnosis not present

## 2016-03-17 DIAGNOSIS — I10 Essential (primary) hypertension: Secondary | ICD-10-CM | POA: Insufficient documentation

## 2016-03-17 DIAGNOSIS — I442 Atrioventricular block, complete: Secondary | ICD-10-CM | POA: Insufficient documentation

## 2016-03-17 HISTORY — DX: Atrioventricular block, complete: I44.2

## 2016-03-17 HISTORY — DX: Malignant melanoma of right upper limb, including shoulder: C43.61

## 2016-03-17 HISTORY — DX: Left bundle-branch block, unspecified: I44.7

## 2016-03-17 HISTORY — DX: Malignant (primary) neoplasm, unspecified: C80.1

## 2016-03-17 HISTORY — PX: MELANOMA EXCISION: SHX5266

## 2016-03-17 LAB — COMPREHENSIVE METABOLIC PANEL
ALBUMIN: 3.8 g/dL (ref 3.5–5.0)
ALT: 33 U/L (ref 14–54)
ANION GAP: 10 (ref 5–15)
AST: 30 U/L (ref 15–41)
Alkaline Phosphatase: 66 U/L (ref 38–126)
BUN: 16 mg/dL (ref 6–20)
CHLORIDE: 109 mmol/L (ref 101–111)
CO2: 20 mmol/L — AB (ref 22–32)
Calcium: 9.7 mg/dL (ref 8.9–10.3)
Creatinine, Ser: 0.7 mg/dL (ref 0.44–1.00)
GFR calc Af Amer: >60 mL/min (ref >60)
GFR calc non Af Amer: >60 mL/min (ref >60)
GLUCOSE: 139 mg/dL — AB (ref 65–99)
POTASSIUM: 4.5 mmol/L (ref 3.5–5.1)
SODIUM: 139 mmol/L (ref 135–145)
Total Bilirubin: 0.7 mg/dL (ref 0.3–1.2)
Total Protein: 6.7 g/dL (ref 6.5–8.1)

## 2016-03-17 LAB — CBC WITH DIFFERENTIAL/PLATELET
BASOS ABS: 0.1 10*3/uL (ref 0.0–0.1)
BASOS PCT: 1 %
Eosinophils Absolute: 0.3 10*3/uL (ref 0.0–0.7)
Eosinophils Relative: 4 %
HCT: 43.7 % (ref 36.0–46.0)
Hemoglobin: 15 g/dL (ref 12.0–15.0)
LYMPHS PCT: 30 %
Lymphs Abs: 2.3 10*3/uL (ref 0.7–4.0)
MCH: 31.9 pg (ref 26.0–34.0)
MCHC: 34.3 g/dL (ref 30.0–36.0)
MCV: 93 fL (ref 78.0–100.0)
MONO ABS: 0.6 10*3/uL (ref 0.1–1.0)
MONOS PCT: 8 %
NEUTROS ABS: 4.3 10*3/uL (ref 1.7–7.7)
NEUTROS PCT: 57 %
Platelets: 287 10*3/uL (ref 150–400)
RBC: 4.7 MIL/uL (ref 3.87–5.11)
RDW: 13.3 % (ref 11.5–15.5)
WBC: 7.6 10*3/uL (ref 4.0–10.5)

## 2016-03-17 SURGERY — EXCISION, MELANOMA
Anesthesia: General | Site: Arm Lower | Laterality: Right

## 2016-03-17 MED ORDER — LIDOCAINE-EPINEPHRINE (PF) 1 %-1:200000 IJ SOLN
INTRAMUSCULAR | Status: AC
  Filled 2016-03-17: qty 30

## 2016-03-17 MED ORDER — FENTANYL CITRATE (PF) 100 MCG/2ML IJ SOLN: 25.0000 ug | INTRAMUSCULAR | Status: DC | PRN

## 2016-03-17 MED ORDER — LACTATED RINGERS IV SOLN
INTRAVENOUS | Status: DC | PRN
  Administered 2016-03-17: 14:00:00 via INTRAVENOUS

## 2016-03-17 MED ORDER — DEXAMETHASONE SODIUM PHOSPHATE 10 MG/ML IJ SOLN
INTRAMUSCULAR | Status: DC | PRN
  Administered 2016-03-17: 10 mg via INTRAVENOUS

## 2016-03-17 MED ORDER — 0.9 % SODIUM CHLORIDE (POUR BTL) OPTIME
TOPICAL | Status: DC | PRN
  Administered 2016-03-17: 1000 mL

## 2016-03-17 MED ORDER — ONDANSETRON HCL 4 MG/2ML IJ SOLN
4.0000 mg | Freq: Four times a day (QID) | INTRAMUSCULAR | Status: AC | PRN
  Administered 2016-03-17: 4 mg via INTRAVENOUS

## 2016-03-17 MED ORDER — CHLORHEXIDINE GLUCONATE 4 % EX LIQD: 1.0000 "application " | Freq: Once | CUTANEOUS | Status: DC

## 2016-03-17 MED ORDER — FENTANYL CITRATE (PF) 100 MCG/2ML IJ SOLN
25.0000 ug | INTRAMUSCULAR | Status: DC | PRN
  Administered 2016-03-17 (×2): 25 ug via INTRAVENOUS

## 2016-03-17 MED ORDER — FENTANYL CITRATE (PF) 100 MCG/2ML IJ SOLN
INTRAMUSCULAR | Status: DC | PRN
Start: 2016-03-17 — End: 2016-03-17
  Administered 2016-03-17: 50 ug via INTRAVENOUS
  Administered 2016-03-17 (×3): 25 ug via INTRAVENOUS

## 2016-03-17 MED ORDER — CEFAZOLIN SODIUM-DEXTROSE 2-4 GM/100ML-% IV SOLN
INTRAVENOUS | Status: AC
  Filled 2016-03-17: qty 100

## 2016-03-17 MED ORDER — SODIUM CHLORIDE 0.9 % IV SOLN
10000.0000 ug | INTRAVENOUS | Status: DC | PRN
  Administered 2016-03-17 (×2): 120 ug via INTRAVENOUS
  Administered 2016-03-17 (×2): 80 ug via INTRAVENOUS

## 2016-03-17 MED ORDER — FENTANYL CITRATE (PF) 250 MCG/5ML IJ SOLN
INTRAMUSCULAR | Status: AC
  Filled 2016-03-17: qty 5

## 2016-03-17 MED ORDER — HYDROCODONE-ACETAMINOPHEN 5-325 MG PO TABS: 1.0000 | ORAL_TABLET | Freq: Four times a day (QID) | ORAL | Status: DC | PRN

## 2016-03-17 MED ORDER — ONDANSETRON HCL 4 MG/2ML IJ SOLN
INTRAMUSCULAR | Status: DC | PRN
Start: 2016-03-17 — End: 2016-03-17
  Administered 2016-03-17: 4 mg via INTRAVENOUS

## 2016-03-17 MED ORDER — CEFAZOLIN SODIUM-DEXTROSE 2-4 GM/100ML-% IV SOLN
2.0000 g | INTRAVENOUS | Status: AC
  Administered 2016-03-17: 2 g via INTRAVENOUS

## 2016-03-17 MED ORDER — ONDANSETRON HCL 4 MG/2ML IJ SOLN
INTRAMUSCULAR | Status: AC
  Administered 2016-03-17: 4 mg via INTRAVENOUS
  Filled 2016-03-17: qty 2

## 2016-03-17 MED ORDER — LIDOCAINE HCL (CARDIAC) 20 MG/ML IV SOLN
INTRAVENOUS | Status: DC | PRN
  Administered 2016-03-17: 70 mg via INTRAVENOUS

## 2016-03-17 MED ORDER — FENTANYL CITRATE (PF) 100 MCG/2ML IJ SOLN
INTRAMUSCULAR | Status: AC
  Administered 2016-03-17: 25 ug via INTRAVENOUS
  Filled 2016-03-17: qty 2

## 2016-03-17 MED ORDER — PROPOFOL 10 MG/ML IV BOLUS
INTRAVENOUS | Status: DC | PRN
  Administered 2016-03-17: 150 mg via INTRAVENOUS

## 2016-03-17 SURGICAL SUPPLY — 43 items
BANDAGE ELASTIC 4 VELCRO ST LF (GAUZE/BANDAGES/DRESSINGS) ×3 IMPLANT
BENZOIN TINCTURE PRP APPL 2/3 (GAUZE/BANDAGES/DRESSINGS) IMPLANT
CANISTER SUCTION 2500CC (MISCELLANEOUS) IMPLANT
CHLORAPREP W/TINT 26ML (MISCELLANEOUS) ×3 IMPLANT
CLOSURE WOUND 1/2 X4 (GAUZE/BANDAGES/DRESSINGS)
COVER SURGICAL LIGHT HANDLE (MISCELLANEOUS) ×3 IMPLANT
DECANTER SPIKE VIAL GLASS SM (MISCELLANEOUS) IMPLANT
DRAPE LAPAROTOMY T 98X78 PEDS (DRAPES) ×3 IMPLANT
DRAPE UTILITY XL STRL (DRAPES) ×6 IMPLANT
ELECT CAUTERY BLADE 6.4 (BLADE) ×3 IMPLANT
ELECT REM PT RETURN 9FT ADLT (ELECTROSURGICAL) ×3
ELECTRODE REM PT RTRN 9FT ADLT (ELECTROSURGICAL) ×1 IMPLANT
GAUZE SPONGE 4X4 12PLY STRL (GAUZE/BANDAGES/DRESSINGS) ×6 IMPLANT
GAUZE SPONGE 4X4 16PLY XRAY LF (GAUZE/BANDAGES/DRESSINGS) ×3 IMPLANT
GAUZE XEROFORM 5X9 LF (GAUZE/BANDAGES/DRESSINGS) ×3 IMPLANT
GLOVE EUDERMIC 7 POWDERFREE (GLOVE) ×3 IMPLANT
GOWN STRL REUS W/ TWL LRG LVL3 (GOWN DISPOSABLE) ×1 IMPLANT
GOWN STRL REUS W/ TWL XL LVL3 (GOWN DISPOSABLE) ×1 IMPLANT
GOWN STRL REUS W/TWL LRG LVL3 (GOWN DISPOSABLE) ×2
GOWN STRL REUS W/TWL XL LVL3 (GOWN DISPOSABLE) ×2
KIT ROOM TURNOVER OR (KITS) ×3 IMPLANT
NEEDLE HYPO 25GX1X1/2 BEV (NEEDLE) IMPLANT
NS IRRIG 1000ML POUR BTL (IV SOLUTION) ×3 IMPLANT
PACK SURGICAL SETUP 50X90 (CUSTOM PROCEDURE TRAY) ×3 IMPLANT
PAD ARMBOARD 7.5X6 YLW CONV (MISCELLANEOUS) ×6 IMPLANT
PENCIL BUTTON HOLSTER BLD 10FT (ELECTRODE) ×3 IMPLANT
SPECIMEN JAR SMALL (MISCELLANEOUS) ×3 IMPLANT
SPONGE LAP 4X18 X RAY DECT (DISPOSABLE) IMPLANT
STAPLER VISISTAT 35W (STAPLE) IMPLANT
STRIP CLOSURE SKIN 1/2X4 (GAUZE/BANDAGES/DRESSINGS) IMPLANT
SUT ETHILON 3 0 PS 1 (SUTURE) ×12 IMPLANT
SUT MNCRL AB 4-0 PS2 18 (SUTURE) ×3 IMPLANT
SUT SILK 3 0SH CR/8 30 (SUTURE) ×3 IMPLANT
SUT VIC AB 3-0 SH 27 (SUTURE) ×2
SUT VIC AB 3-0 SH 27XBRD (SUTURE) ×1 IMPLANT
SYR BULB 3OZ (MISCELLANEOUS) ×3 IMPLANT
SYR CONTROL 10ML LL (SYRINGE) IMPLANT
TOWEL OR 17X24 6PK STRL BLUE (TOWEL DISPOSABLE) ×3 IMPLANT
TOWEL OR 17X26 10 PK STRL BLUE (TOWEL DISPOSABLE) ×3 IMPLANT
TUBE CONNECTING 12'X1/4 (SUCTIONS)
TUBE CONNECTING 12X1/4 (SUCTIONS) IMPLANT
WATER STERILE IRR 1000ML POUR (IV SOLUTION) IMPLANT
YANKAUER SUCT BULB TIP NO VENT (SUCTIONS) IMPLANT

## 2016-03-17 NOTE — Anesthesia Postprocedure Evaluation (Signed)
Anesthesia Post Note  Patient: Melissa Novak  Procedure(s) Performed: Procedure(s) (LRB): WIDE EXCISION MELANOMA RIGHT FOREARM (Right)  Patient location during evaluation: PACU Anesthesia Type: General Level of consciousness: awake and alert and patient cooperative Pain management: pain level controlled Vital Signs Assessment: post-procedure vital signs reviewed and stable Respiratory status: spontaneous breathing and respiratory function stable Cardiovascular status: stable Anesthetic complications: no    Last Vitals:  Filed Vitals:   03/17/16 1530 03/17/16 1545  BP: 129/57   Pulse: 93 90  Temp:    Resp: 14 16    Last Pain:  Filed Vitals:   03/17/16 1549  PainSc: [place]

## 2016-03-17 NOTE — Anesthesia Preprocedure Evaluation (Signed)
Anesthesia Evaluation  Patient identified by MRN, date of birth, ID band Patient awake    Reviewed: Allergy & Precautions, NPO status , Patient's Chart, lab work & pertinent test results  History of Anesthesia Complications (+) PONV  Airway Mallampati: II   Neck ROM: full    Dental   Pulmonary former smoker,    breath sounds clear to auscultation       Cardiovascular hypertension, + CAD  + dysrhythmias + pacemaker  Rhythm:regular Rate:Normal  Complete heart block.  Cleared by cardiologist.   Neuro/Psych    GI/Hepatic   Endo/Other  obese  Renal/GU      Musculoskeletal   Abdominal   Peds  Hematology   Anesthesia Other Findings   Reproductive/Obstetrics                             Anesthesia Physical Anesthesia Plan  ASA: III  Anesthesia Plan: General   Post-op Pain Management:    Induction: Intravenous  Airway Management Planned: LMA  Additional Equipment:   Intra-op Plan:   Post-operative Plan:   Informed Consent: I have reviewed the patients History and Physical, chart, labs and discussed the procedure including the risks, benefits and alternatives for the proposed anesthesia with the patient or authorized representative who has indicated his/her understanding and acceptance.     Plan Discussed with: Anesthesiologist, Surgeon and CRNA  Anesthesia Plan Comments:         Anesthesia Quick Evaluation

## 2016-03-17 NOTE — Discharge Instructions (Signed)
Keep wound clean and dry for a few days Elevate as much as possible You may loosen the Ace wrap if it feels too tight or if you have any tingling in your fingers  You may sponge bathe now but keep the wound dry You may start taking a shower in 3 days. After your shower be sure to dry the wound off and replaced the bandage with 4 x 4's and Ace wrap  Call Dr. Dalbert Batman if there are any problems with severe pain, bleeding or unusual swelling or numbness in your fingers  We will call the pathology report to you next Tuesday, or as soon as it is available.

## 2016-03-17 NOTE — Anesthesia Procedure Notes (Addendum)
Procedure Name: LMA Insertion Date/Time: 03/17/2016 2:31 PM Performed by: Salli Quarry Munachimso Palin Pre-anesthesia Checklist: Patient identified, Emergency Drugs available, Suction available and Patient being monitored Patient Re-evaluated:Patient Re-evaluated prior to inductionOxygen Delivery Method: Circle System Utilized Preoxygenation: Pre-oxygenation with 100% oxygen Intubation Type: IV induction LMA: LMA inserted LMA Size: 4.0 Number of attempts: 1 Placement Confirmation: positive ETCO2 and breath sounds checked- equal and bilateral Tube secured with: Tape Dental Injury: Teeth and Oropharynx as per pre-operative assessment

## 2016-03-17 NOTE — Progress Notes (Addendum)
Pt states that she can't take a lot of antibiotics. Pharmacy called and asked about allergies. Spoke with Earle. Dr Dalbert Batman called and informed states to follow the check list if pt has not had throat swelling ok to give Ancef if throat swelling change to vancomycin. Pt states she had a fever and rash, but no throat swelling.

## 2016-03-17 NOTE — Transfer of Care (Signed)
Immediate Anesthesia Transfer of Care Note  Patient: Melissa Novak  Procedure(s) Performed: Procedure(s): WIDE EXCISION MELANOMA RIGHT FOREARM (Right)  Patient Location: PACU  Anesthesia Type:General  Level of Consciousness: awake, alert , oriented and patient cooperative  Airway & Oxygen Therapy: Patient Spontanous Breathing and Patient connected to nasal cannula oxygen  Post-op Assessment: Report given to RN and Post -op Vital signs reviewed and stable  Post vital signs: Reviewed and stable  Last Vitals:  Filed Vitals:   03/17/16 1223  BP: 160/72  Pulse: 95  Temp: 36.4 C  Resp: 18    Last Pain: There were no vitals filed for this visit.    Patients Stated Pain Goal: 3 ([id number])  Complications: No apparent anesthesia complications

## 2016-03-17 NOTE — Interval H&P Note (Signed)
History and Physical Interval Note:  03/17/2016 2:01 PM  Melissa Novak  has presented today for surgery, with the diagnosis of Melanoma right forearm  The various methods of treatment have been discussed with the patient and family. After consideration of risks, benefits and other options for treatment, the patient has consented to  Procedure(s): WIDE EXCISION MELANOMA RIGHT FOREARM (Right) as a surgical intervention .  The patient's history has been reviewed, patient examined, no change in status, stable for surgery.  I have reviewed the patient's chart and labs.  Questions were answered to the patient's satisfaction.     Adin Hector

## 2016-03-17 NOTE — Op Note (Signed)
Patient Name:           Melissa Novak   Date of Surgery:        03/17/2016  Pre op Diagnosis:      Malignant melanoma, dorsum right forearm, level IV with ulceration  Post op Diagnosis:    Same  Procedure:                 Wide local excision malignant melanoma dorsum right forearm  Surgeon:                     Edsel Petrin. Dalbert Batman, M.D., FACS  Assistant:                      OR staff  Operative Indications:   The patient is a 82 year old female who  returns for a preop discussion. She is scheduled for wide local excision of her right forearm melanoma . Her shave biopsy performed by her dermatologist reveals malignant melanoma, 4.5 mm thickness, anatomic level IV, ulceration present, peripheral and deep margins involved. Mitotic index 6/mm. Lymphovascular invasion absent. High risk. This biopsy was performed on February 18, 2016. This is her first melanoma.      Because she is pacemaker dependent, she has seen her cardiologist Dr. Cristopher Peru who says that we can go ahead with surgery but wants to do this at [facility] and we will notify him of the date.       She has seen Dr. Alen Blew and both she and Dr. Alen Blew feel that adjuvant immunotherapy would not be appropriate nor would it be tolerated, so all she needs wide excision of the melanoma. There does not appear to be any indication for CT or PET scan at this time. We did a lymphoscintigram which did not map to any nodes.      I discussed the indications, details, techniques, numerous risk of the surgery with her and her daughter. I told her that we will either do a rotation flap closure Risk of bleeding, infection, nerve damage with chronic pain or numbness, wound healing failure, second operation if margins are 5 positive requiring skin graft were all discussed. She understands all these issues. All of her questions were answered. She agrees with this plan.  Operative Findings:       I was able to perform a wide local excision  with an lazy S configuration.  The incision was 14 cm long by 6 cm wide and I achieved almost a 2 cm margin in all directions.  The post the specimen was marked with silk sutures to mark the proximal, distal, and lateral margin.  Dissection was taken all the way down and included the deep muscle fascia.  This was adequately excised grossly.  Procedure in Detail:          Following the induction of general LMA anesthesia the patient's right arm was prepped and draped in a sterile fashion.  Surgical timeout was performed.  Intravenous antibiotics were given.  Using a marking pin I marked a long lazy S rotation flap configuration after marking a 2 cm margin all directions.  The incision was made.  Dissection was carried down to the skeletal muscle and everything was completely dissected off.  The margins were marked with silk sutures.  Hemostasis was excellent and achieved with electrocautery.  The wound was irrigated.  With the forearm suppinated I was able to get primary closure without much tension at all.  Some of the subcutaneous  tissue was closed with 3-0 Vicryl sutures and the skin was closed with interrupted 3-0 nylon.  The wound was cleansed with saline, dried, and we placed Xeroform, 4 x 4's, and a loose Ace wrap.  The patient tolerated the procedure well was taken to PACU in stable condition.  EBL 20 mL or less.  Counts correct.  Complications none.     Edsel Petrin. Dalbert Batman, M.D., FACS General and Minimally Invasive Surgery Breast and Colorectal Surgery  03/17/2016 3:12 PM

## 2016-03-18 ENCOUNTER — Encounter (HOSPITAL_COMMUNITY): Payer: Self-pay | Admitting: General Surgery

## 2016-03-21 ENCOUNTER — Telehealth: Payer: Self-pay | Admitting: *Deleted

## 2016-03-21 NOTE — Telephone Encounter (Signed)
Prishia with [facility] called reporting pathology report for this patienr with Melanoma needs to be sent to Dr. Alen Blew, Dr. Dalbert Batman, Dr. Pablo Ledger and others.  Provided [place] Fax number [phone] advising she fax results to other offices.

## 2016-04-06 ENCOUNTER — Encounter: Payer: Medicare Other | Admitting: Internal Medicine

## 2016-04-20 ENCOUNTER — Telehealth: Payer: Self-pay | Admitting: Oncology

## 2016-04-20 ENCOUNTER — Ambulatory Visit: Payer: Medicare Other | Admitting: Oncology

## 2016-04-20 ENCOUNTER — Ambulatory Visit (HOSPITAL_BASED_OUTPATIENT_CLINIC_OR_DEPARTMENT_OTHER): Payer: Medicare Other | Admitting: Oncology

## 2016-04-20 VITALS — BP 150/64 | HR 93 | Temp 97.5°F | Resp 17 | Ht 63.0 in | Wt 216.7 lb

## 2016-04-20 DIAGNOSIS — Z85820 Personal history of malignant melanoma of skin: Secondary | ICD-10-CM

## 2016-04-20 DIAGNOSIS — C4361 Malignant melanoma of right upper limb, including shoulder: Secondary | ICD-10-CM

## 2016-04-20 NOTE — Progress Notes (Signed)
Hematology and Oncology Follow Up Visit  Melissa Novak [id number] [birth year] 82 y.o. 04/20/2016 12:28 PM Melissa Novak, MDStoneking, Hal, MD   Principle Diagnosis: 82 year old woman diagnosed with malignant melanoma on 02/18/2016. She was found to have a lesion on her right forearm and a shave biopsy showed a 4.5 mm malignant melanoma with ulceration.   Prior Therapy: She is status post wide excision done on 03/17/2016. The final pathology showed a maximum tumor thickness of 4.5 mm with negative margins. Ulcerations are present. The tumor infiltration with lymphocytes was described as non-brisk. The mitotic index was 6/mm. Final pathology stage was pT4b NX MX.   Current therapy: Observation and surveillance.  Interim History: Melissa Novak presents today for a follow-up visit. Since her last visit, she underwent wide excision of her forearm melanoma and have tolerated it well. No residual complications noted at this time. She does report some soreness in that arm but no other complications. She resumed all activities of daily living without any decline. She had not required any occupational therapy.  She does not report any headaches, vision, syncope or seizures. She does not report any fevers, chills sweats. Does not report any cough, wheezing or hemoptysis. Does not report any nausea, vomiting or abdominal pain. Does not report any chest pain, palpitation orthopnea. Does not report any cough, wheezing or hemoptysis. Does not report any nausea, vomiting or abdominal pain. Does not report any gross satiety or change in her bowel habits. She does not report any frequency urgency or hesitancy. Remaining review of system is unremarkable.    Medications: I have reviewed the patient's current medications.  Current Outpatient Prescriptions  Medication Sig Dispense Refill  . amLODipine (NORVASC) 2.5 MG tablet Take 2.5 mg by mouth daily.    . Ascorbic Acid (VITAMIN C) 1000 MG tablet Take  1,000 mg by mouth daily.    Marland Kitchen aspirin 81 MG tablet Take 81 mg by mouth daily.    . calcium carbonate (OS-CAL) 600 MG TABS tablet Take 600 mg by mouth 2 (two) times daily with a meal.    . Cholecalciferol (VITAMIN D PO) Take 1 tablet by mouth daily.    Marland Kitchen FEMRING 0.1 MG/24HR RING Place 1 each vaginally See admin instructions. Change every 90 days    . furosemide (LASIX) 20 MG tablet Take 20 mg by mouth daily.    Marland Kitchen GARLIC PO Take 1 tablet by mouth daily.     Marland Kitchen HYDROcodone-acetaminophen (NORCO) 5-325 MG tablet Take 1-2 tablets by mouth every 6 (six) hours as needed for moderate pain or severe pain. 30 tablet 0  . losartan (COZAAR) 50 MG tablet Take 50 mg by mouth 2 (two) times daily.    . Misc Natural Products (OSTEO BI-FLEX JOINT SHIELD PO) Take 1 tablet by mouth daily.     . Multiple Vitamin (MULTIVITAMIN WITH MINERALS) TABS Take 1 tablet by mouth daily.    . Multiple Vitamins-Minerals (ZINC PO) Take 1 tablet by mouth daily.     . Omega-3 Fatty Acids (FISH OIL PO) Take 1 capsule by mouth daily.     . valACYclovir (VALTREX) 1000 MG tablet Take 1,000 mg by mouth daily.     No current facility-administered medications for this visit.     Allergies:  Allergies  Allergen Reactions  . Sulfa Antibiotics Anaphylaxis  . Tramadol Hives, Nausea Only and Other (See Comments)    Blurred vision  . Percocet [Oxycodone-Acetaminophen] Other (See Comments)    Unspecified Patient doesn't remember but says  she is allergic  . Benadryl [Diphenhydramine Hcl] Rash and Other (See Comments)    BLURRED VISION  . Cephalexin Rash  . Codeine Rash  . Demerol Nausea Only and Rash  . Dilaudid [Hydromorphone Hcl] Nausea And Vomiting  . Dimenhydrinate Rash and Other (See Comments)    BLURRED VISION  . Meclizine Hcl Rash and Other (See Comments)    BLURRED VISION  . Penicillins Rash and Other (See Comments)    FEVER  . Pentazocine Lactate Rash and Other (See Comments)    FEVER    Past Medical History, Surgical  history, Social history, and Family History were reviewed and updated.   Physical Exam: Blood pressure 150/64, pulse 93, temperature 97.5 F (36.4 C), temperature source Oral, resp. rate 17, height 5\' 3"  (1.6 m), weight 216 lb 11.2 oz (98.294 kg), SpO2 94 %. ECOG: 1 General appearance: alert and cooperative Head: Normocephalic, without obvious abnormality Neck: no adenopathy Lymph nodes: Cervical, supraclavicular, and axillary nodes normal. Heart:regular rate and rhythm, S1, S2 normal, no murmur, click, rub or gallop Lung:chest clear, no wheezing, rales, normal symmetric air entry Abdomin: soft, non-tender, without masses or organomegaly EXT:no erythema, induration, or nodules   Lab Results: Lab Results  Component Value Date   WBC 7.6 03/17/2016   HGB 15.0 03/17/2016   HCT 43.7 03/17/2016   MCV 93.0 03/17/2016   PLT 287 03/17/2016     Chemistry      Component Value Date/Time   NA 139 03/17/2016 1236   K 4.5 03/17/2016 1236   CL 109 03/17/2016 1236   CO2 20* 03/17/2016 1236   BUN 16 03/17/2016 1236   CREATININE 0.70 03/17/2016 1236      Component Value Date/Time   CALCIUM 9.7 03/17/2016 1236   ALKPHOS 66 03/17/2016 1236   AST 30 03/17/2016 1236   ALT 33 03/17/2016 1236   BILITOT 0.7 03/17/2016 1236      Impression and Plan:  82 year old woman with the following issues:  1. Malignant melanoma diagnosed on 02/18/2016. She had a lesion that had grown over the years and underwent shave biopsy which confirmed the presence of a 4.5 mm malignant melanoma with ulceration. She had positive margins and certainly will require wide excision. She underwent a nuclear medicine lymphangiography and a sentinel node was not able to be identified.   She is status post wide excision done on 03/17/2016 which showed the final pathology to be T4b with ulceration and non-brisk lymphocyte tumor infiltration. These findings were discussed again with the patient as well as the rationale for  using adjuvant therapy. The risks and benefits of Ipilimumab were reviewed again and she continues to decline. Based on these findings, I have suggested continued active surveillance. Given her high risk nature of metastasis and she would be willing to try different immunotherapy or possible be left targeted agent if needed to, I would recommend imaging studies. CT scan chest abdomen and pelvis would be reasonable to detect metastasis given the fact that she is open to trying anticancer therapy at that time.  The plan is to obtain these imaging studies in the near future and if they are negative, follow-up in 6 months. I recommended continued dermatology evaluation as she is doing.  2. Follow-up: Will be in 6 months sooner for scan showed any metastatic disease.    [facility], MD 5/31/201712:28 PM

## 2016-04-20 NOTE — Telephone Encounter (Signed)
per pof to sch pt appt-gave pt copy of avs °

## 2016-04-21 DIAGNOSIS — N952 Postmenopausal atrophic vaginitis: Secondary | ICD-10-CM | POA: Diagnosis not present

## 2016-04-28 ENCOUNTER — Ambulatory Visit (HOSPITAL_COMMUNITY)
Admission: RE | Admit: 2016-04-28 | Discharge: 2016-04-28 | Disposition: A | Discharge status: Home / Self Care | Payer: Medicare Other | Source: Ambulatory Visit | Attending: Oncology | Admitting: Oncology

## 2016-04-28 DIAGNOSIS — R932 Abnormal findings on diagnostic imaging of liver and biliary tract: Secondary | ICD-10-CM | POA: Diagnosis not present

## 2016-04-28 DIAGNOSIS — C4361 Malignant melanoma of right upper limb, including shoulder: Secondary | ICD-10-CM | POA: Diagnosis present

## 2016-04-28 MED ORDER — IOPAMIDOL (ISOVUE-300) INJECTION 61%
100.0000 mL | Freq: Once | INTRAVENOUS | Status: AC | PRN
  Administered 2016-04-28: 100 mL via INTRAVENOUS

## 2016-04-29 ENCOUNTER — Telehealth: Payer: Self-pay | Admitting: *Deleted

## 2016-04-29 NOTE — Telephone Encounter (Signed)
Per dr Alen Blew, spoke with patient. Her scan was normal.

## 2016-04-29 NOTE — Telephone Encounter (Signed)
-----   Message from Wyatt Portela, MD sent at 04/28/2016  1:00 PM EDT ----- Please let her know that her scan is normal.

## 2016-05-12 DIAGNOSIS — R197 Diarrhea, unspecified: Secondary | ICD-10-CM | POA: Diagnosis not present

## 2016-06-10 ENCOUNTER — Encounter: Payer: Self-pay | Admitting: Cardiology

## 2016-06-10 ENCOUNTER — Ambulatory Visit (INDEPENDENT_AMBULATORY_CARE_PROVIDER_SITE_OTHER): Payer: Medicare Other | Admitting: *Deleted

## 2016-06-10 DIAGNOSIS — I442 Atrioventricular block, complete: Secondary | ICD-10-CM | POA: Diagnosis not present

## 2016-06-10 LAB — CUP PACEART REMOTE DEVICE CHECK
Battery Remaining Percentage: 81 %
Battery Voltage: 2.93 V
Brady Statistic AP VP Percent: 1 %
Brady Statistic AS VS Percent: 1 %
Brady Statistic RA Percent Paced: 1 %
Implantable Lead Implant Date: 20130408
Implantable Lead Location: 753859
Lead Channel Impedance Value: 390 Ohm
Lead Channel Pacing Threshold Pulse Width: 0.4 ms
Lead Channel Sensing Intrinsic Amplitude: 4.9 mV
Lead Channel Setting Pacing Amplitude: 0.75 V
Lead Channel Setting Pacing Pulse Width: 0.4 ms
Lead Channel Setting Sensing Sensitivity: 4 mV
MDC IDC LEAD IMPLANT DT: 20130408
MDC IDC LEAD LOCATION: 753860
MDC IDC MSMT BATTERY REMAINING LONGEVITY: 104 mo
MDC IDC MSMT LEADCHNL RA PACING THRESHOLD AMPLITUDE: 0.75 V
MDC IDC MSMT LEADCHNL RA PACING THRESHOLD PULSEWIDTH: 0.4 ms
MDC IDC MSMT LEADCHNL RV IMPEDANCE VALUE: 450 Ohm
MDC IDC MSMT LEADCHNL RV PACING THRESHOLD AMPLITUDE: 0.5 V
MDC IDC PG SERIAL: 7338769
MDC IDC SESS DTM: 20170719141330
MDC IDC SET LEADCHNL RA PACING AMPLITUDE: 1.75 V
MDC IDC STAT BRADY AP VS PERCENT: 1 %
MDC IDC STAT BRADY AS VP PERCENT: 99 %
MDC IDC STAT BRADY RV PERCENT PACED: 99 %

## 2016-06-10 NOTE — Progress Notes (Signed)
Remote pacemaker transmission.   

## 2016-07-04 ENCOUNTER — Telehealth: Payer: Self-pay

## 2016-07-04 NOTE — Telephone Encounter (Signed)
Pt called to clarify appts. Done

## 2016-08-03 DIAGNOSIS — N952 Postmenopausal atrophic vaginitis: Secondary | ICD-10-CM | POA: Diagnosis not present

## 2016-08-04 DIAGNOSIS — K591 Functional diarrhea: Secondary | ICD-10-CM | POA: Diagnosis not present

## 2016-08-04 DIAGNOSIS — Z6841 Body Mass Index (BMI) 40.0 and over, adult: Secondary | ICD-10-CM | POA: Diagnosis not present

## 2016-08-04 DIAGNOSIS — Z23 Encounter for immunization: Secondary | ICD-10-CM | POA: Diagnosis not present

## 2016-08-04 DIAGNOSIS — I10 Essential (primary) hypertension: Secondary | ICD-10-CM | POA: Diagnosis not present

## 2016-08-17 ENCOUNTER — Ambulatory Visit (INDEPENDENT_AMBULATORY_CARE_PROVIDER_SITE_OTHER): Payer: Medicare Other | Admitting: Cardiology

## 2016-08-17 ENCOUNTER — Encounter (INDEPENDENT_AMBULATORY_CARE_PROVIDER_SITE_OTHER): Payer: Self-pay

## 2016-08-17 ENCOUNTER — Encounter: Payer: Self-pay | Admitting: Cardiology

## 2016-08-17 VITALS — BP 126/82 | HR 80 | Ht 63.0 in | Wt 217.0 lb

## 2016-08-17 DIAGNOSIS — Z950 Presence of cardiac pacemaker: Secondary | ICD-10-CM

## 2016-08-17 DIAGNOSIS — I447 Left bundle-branch block, unspecified: Secondary | ICD-10-CM | POA: Diagnosis not present

## 2016-08-17 DIAGNOSIS — I10 Essential (primary) hypertension: Secondary | ICD-10-CM | POA: Diagnosis not present

## 2016-08-17 DIAGNOSIS — I442 Atrioventricular block, complete: Secondary | ICD-10-CM

## 2016-08-17 NOTE — Progress Notes (Signed)
[facility]. [address] [place], [place]  [address] Phone: [phone] Fax:  [phone]  Date:  08/17/2016   ID:  Melissa Novak, DOB Jan 28, 1934, MRN [id number]  PCP:  Mathews Argyle, MD   History of Present Illness: Melissa Novak is a 82 y.o. female here for follow-up, pacemaker, edema, hypertension, obesity. Previously had pauses up to 6 seconds and had pacemaker placed.  Melanoma - right arm. Dr. Alen Blew. Obviously had a rough time with this. Once were. Of depression. Feeling better.  Weight- She is going to the [facility], physical trainer. She states that Medicare will pay for 8 weeks of this. She is trying to avoid carbohydrates.  Hypertension-she is having intermittent swelling but this seems to be improved on lower dose amlodipine. On losartan as well. Dr. Felipa Eth has discussed this with her. Her blood pressure is better today.  Denies any syncope, bleeding, chest pain. Has minor cough even on losartan. Pacemaker functioning well. Dr. Lovena Le.  She does have knee osteoarthritis. No chest pain, no shortness of breath.  Wt Readings from Last 3 Encounters:  08/17/16 217 lb (98.4 kg)  04/20/16 216 lb 11.2 oz (98.3 kg)  03/17/16 216 lb (98 kg)     Past Medical History:  Diagnosis Date  . Cancer ([place])    melanoma right forearm  . Complete heart block ([place])   . Coronary artery disease CARDIOLOGIST- DR Gillian Shields-  LAST VISIT THIS WEEK--  WILL REQUEST NOTE, STRESS TEST    PT DENIES S & S  . Cough    pt states has had cough  5-7 weeks. pcp aware  . Diverticulosis   . History of pulmonary embolism 1970'S  . History of shingles 2004 --  EYES  , NO RESIDUAL PAIN  . Hypertension   . LBBB (left bundle branch block)   . Malignant melanoma of right forearm ([place]) 03/17/2016  . Pacemaker 4/13   DDD St Jude  . PONV (postoperative nausea and vomiting)    pt states no n/v w 4/13 surgery  . Prolapse of anterior vaginal wall     Past Surgical History:  Procedure  Laterality Date  . BREAST SURGERY     biopsy  . CARDIOVASCULAR STRESS TEST  02-14-2012  DR SKAINS   LOW RISK NUCLEAR TEST/ MILD FIXED DEFECT ALONG THE DISTAL ANTERIOR SEPTAL WALL CONSTISTENT WITH LBBB/ NO ISCHEMIA   . CATARACT EXTRACTION W/ INTRAOCULAR LENS  IMPLANT, BILATERAL    . CHOLECYSTECTOMY  1967  . INSERT / REPLACE / REMOVE PACEMAKER  4/13   ddd St Jude pacer,d/t symptomatic bradycardia w intermittent complete heart block.  Inserted By Dr. Crissie Sickles 4/13  . MELANOMA EXCISION Right 03/17/2016   Procedure: WIDE EXCISION MELANOMA RIGHT FOREARM;  Surgeon: Fanny Skates, MD;  Location: [facility];  Service: General;  Laterality: Right;  . PERMANENT PACEMAKER INSERTION N/A 02/27/2012   Procedure: PERMANENT PACEMAKER INSERTION;  Surgeon: Evans Lance, MD;  [facility];  Service: Cardiovascular;  Laterality: N/A;  . PUBOVAGINAL SLING  02/24/2012   Procedure: Gaynelle Arabian;  Surgeon: Ailene Rud, MD;  [facility];  Service: Urology;  Laterality: N/A;  [employer] UPHOLD LITE ANTERIOR VAULT REPAIR OWER   . PUBOVAGINAL SLING  12/07/2012   Procedure: Gaynelle Arabian;  Surgeon: Ailene Rud, MD;  Location: [facility];  Service: Urology;  Laterality: N/A;  solyx sling   . TRANSTHORACIC ECHOCARDIOGRAM  02-15-2012  DR Marlou Porch  NORMAL EF 60-65%/ MILD AORTIC SCLEROSIS, NO STENOSIS, GRADE I DIASTOLIC DYSFUNCTION  . VAGINAL HYSTERECTOMY  1974   STILL HAS OVARIES    Current Outpatient Prescriptions  Medication Sig Dispense Refill  . amLODipine (NORVASC) 2.5 MG tablet Take 2.5 mg by mouth daily.    . Ascorbic Acid (VITAMIN C) 1000 MG tablet Take 1,000 mg by mouth daily.    Marland Kitchen aspirin 81 MG tablet Take 81 mg by mouth daily.    . calcium carbonate (OS-CAL) 600 MG TABS tablet Take 600 mg by mouth 2 (two) times daily with a meal.    . Cholecalciferol (VITAMIN D PO) Take 1 tablet by mouth daily.    Marland Kitchen FEMRING 0.1 MG/24HR RING  Place 1 each vaginally See admin instructions. Change every 90 days    . furosemide (LASIX) 20 MG tablet Take 20 mg by mouth daily.    Marland Kitchen GARLIC PO Take 1 tablet by mouth daily.     Marland Kitchen HYDROcodone-acetaminophen (NORCO) 5-325 MG tablet Take 1-2 tablets by mouth every 6 (six) hours as needed for moderate pain or severe pain. 30 tablet 0  . losartan (COZAAR) 50 MG tablet Take 50 mg by mouth 2 (two) times daily.    . Misc Natural Products (OSTEO BI-FLEX JOINT SHIELD PO) Take 1 tablet by mouth daily.     . Multiple Vitamin (MULTIVITAMIN WITH MINERALS) TABS Take 1 tablet by mouth daily.    . Multiple Vitamins-Minerals (ZINC PO) Take 1 tablet by mouth daily.     . Omega-3 Fatty Acids (FISH OIL PO) Take 1 capsule by mouth daily.     . valACYclovir (VALTREX) 1000 MG tablet Take 1,000 mg by mouth daily.     No current facility-administered medications for this visit.     Allergies:    Allergies  Allergen Reactions  . Sulfa Antibiotics Anaphylaxis  . Tramadol Hives, Nausea Only and Other (See Comments)    Blurred vision  . Percocet [Oxycodone-Acetaminophen] Other (See Comments)    Unspecified Patient doesn't remember but says she is allergic  . Benadryl [Diphenhydramine Hcl] Rash and Other (See Comments)    BLURRED VISION  . Cephalexin Rash  . Codeine Rash  . Demerol Nausea Only and Rash  . Dilaudid [Hydromorphone Hcl] Nausea And Vomiting  . Dimenhydrinate Rash and Other (See Comments)    BLURRED VISION  . Meclizine Hcl Rash and Other (See Comments)    BLURRED VISION  . Penicillins Rash and Other (See Comments)    FEVER  . Pentazocine Lactate Rash and Other (See Comments)    FEVER    Social History:  The patient  reports that she has quit smoking. She has never used smokeless tobacco. She reports that she drinks alcohol. She reports that she does not use drugs.   ROS:  Please see the history of present illness.   No fevers, no syncope, no orthopnea, no PND   PHYSICAL EXAM: VS:  BP  126/82   Pulse 80   Ht 5\' 3"  (1.6 m)   Wt 217 lb (98.4 kg)   BMI 38.44 kg/m  Well nourished, well developed, in no acute distress  HEENT: normal  Neck: no JVD  Cardiac:  normal S1, S2; RRR; no murmur  Lungs:  clear to auscultation bilaterally, no wheezing, rhonchi or rales  Abd: soft, nontender, no hepatomegaly  Ext: improvededema  Skin: warm and dry right arm scar noted.  Neuro: no focal abnormalities noted  EKG:  11/25/14, heart rate 90 Sinus rhythm,  first-degree AV block, left bundle branch block Sinus rhythm, 86, left anterior fascicular block, intraventricular conduction delay.     ASSESSMENT AND PLAN:  1. Edema-intermittent even with low-dose amlodipine but improved once again. Losartan. Continue with furosemide. Doing well. Has not needed compression hose. 2. Left bundle branch block-no changes made. Interventricular conduction delay. Pacemaker for backup. 3. HTN - controlled. Excellent BP.  4. Melanoma - right arm. Dr. Alen Blew.  5. Obesity-encourage weight loss. She is avoiding "whites " 6. Pacemaker-functioning well. Dr. Lovena Le, [place] 7. Knee pain-Dr. Tonita Cong. Done with injections.  She cannot have an MRI because of her pacemaker. 8. One-year follow-up  Signed, Candee Furbish, MD [facility]  08/17/2016 11:27 AM

## 2016-08-17 NOTE — Patient Instructions (Signed)

## 2016-08-21 DIAGNOSIS — E119 Type 2 diabetes mellitus without complications: Secondary | ICD-10-CM

## 2016-08-21 HISTORY — DX: Type 2 diabetes mellitus without complications: E11.9

## 2016-08-29 ENCOUNTER — Telehealth: Payer: Self-pay | Admitting: *Deleted

## 2016-08-29 NOTE — Telephone Encounter (Signed)
Received call @ 1240 pt. Ask to speak with Dr. Alen Blew RN, she was made aware that Dr. Alen Blew was out of the office today, However RN was still working @ the desk. I ask if she needed immediate assistance, patient stated NO.  Call was then  transferred to vmail

## 2016-09-01 DIAGNOSIS — A09 Infectious gastroenteritis and colitis, unspecified: Secondary | ICD-10-CM | POA: Diagnosis not present

## 2016-09-12 ENCOUNTER — Ambulatory Visit (INDEPENDENT_AMBULATORY_CARE_PROVIDER_SITE_OTHER): Payer: Medicare Other | Admitting: *Deleted

## 2016-09-12 DIAGNOSIS — I442 Atrioventricular block, complete: Secondary | ICD-10-CM

## 2016-09-12 NOTE — Progress Notes (Signed)
Remote pacemaker transmission.   

## 2016-09-13 ENCOUNTER — Encounter: Payer: Self-pay | Admitting: Cardiology

## 2016-09-19 DIAGNOSIS — J984 Other disorders of lung: Secondary | ICD-10-CM | POA: Diagnosis not present

## 2016-09-19 DIAGNOSIS — M810 Age-related osteoporosis without current pathological fracture: Secondary | ICD-10-CM | POA: Diagnosis not present

## 2016-09-19 DIAGNOSIS — R739 Hyperglycemia, unspecified: Secondary | ICD-10-CM | POA: Diagnosis not present

## 2016-09-19 DIAGNOSIS — Z79899 Other long term (current) drug therapy: Secondary | ICD-10-CM | POA: Diagnosis not present

## 2016-09-19 DIAGNOSIS — Z6841 Body Mass Index (BMI) 40.0 and over, adult: Secondary | ICD-10-CM | POA: Diagnosis not present

## 2016-09-19 DIAGNOSIS — I10 Essential (primary) hypertension: Secondary | ICD-10-CM | POA: Diagnosis not present

## 2016-09-19 DIAGNOSIS — F325 Major depressive disorder, single episode, in full remission: Secondary | ICD-10-CM | POA: Diagnosis not present

## 2016-09-19 DIAGNOSIS — Z0000 Encounter for general adult medical examination without abnormal findings: Secondary | ICD-10-CM | POA: Diagnosis not present

## 2016-10-05 DIAGNOSIS — Z85820 Personal history of malignant melanoma of skin: Secondary | ICD-10-CM | POA: Diagnosis not present

## 2016-10-05 DIAGNOSIS — L7211 Pilar cyst: Secondary | ICD-10-CM | POA: Diagnosis not present

## 2016-10-05 DIAGNOSIS — L821 Other seborrheic keratosis: Secondary | ICD-10-CM | POA: Diagnosis not present

## 2016-10-07 DIAGNOSIS — I10 Essential (primary) hypertension: Secondary | ICD-10-CM | POA: Diagnosis not present

## 2016-10-07 DIAGNOSIS — E119 Type 2 diabetes mellitus without complications: Secondary | ICD-10-CM | POA: Diagnosis not present

## 2016-10-07 DIAGNOSIS — E7800 Pure hypercholesterolemia, unspecified: Secondary | ICD-10-CM | POA: Diagnosis not present

## 2016-10-08 LAB — CUP PACEART REMOTE DEVICE CHECK
Battery Remaining Percentage: 73 %
Battery Voltage: 2.92 V
Brady Statistic AS VS Percent: 1 %
Implantable Lead Implant Date: 20130408
Implantable Lead Location: 753859
Implantable Lead Location: 753860
Implantable Pulse Generator Implant Date: 20130408
Lead Channel Pacing Threshold Amplitude: 0.625 V
Lead Channel Pacing Threshold Amplitude: 1 V
Lead Channel Pacing Threshold Pulse Width: 0.4 ms
Lead Channel Sensing Intrinsic Amplitude: 5 mV
Lead Channel Setting Pacing Amplitude: 0.875
MDC IDC LEAD IMPLANT DT: 20130408
MDC IDC MSMT BATTERY REMAINING LONGEVITY: 94 mo
MDC IDC MSMT LEADCHNL RA IMPEDANCE VALUE: 400 Ohm
MDC IDC MSMT LEADCHNL RA PACING THRESHOLD PULSEWIDTH: 0.4 ms
MDC IDC MSMT LEADCHNL RV IMPEDANCE VALUE: 490 Ohm
MDC IDC MSMT LEADCHNL RV SENSING INTR AMPL: 12 mV
MDC IDC PG SERIAL: 7338769
MDC IDC SESS DTM: 20171023125253
MDC IDC SET LEADCHNL RA PACING AMPLITUDE: 2 V
MDC IDC SET LEADCHNL RV PACING PULSEWIDTH: 0.4 ms
MDC IDC SET LEADCHNL RV SENSING SENSITIVITY: 4 mV
MDC IDC STAT BRADY AP VP PERCENT: 1 %
MDC IDC STAT BRADY AP VS PERCENT: 1 %
MDC IDC STAT BRADY AS VP PERCENT: 99 %
MDC IDC STAT BRADY RA PERCENT PACED: 1 %
MDC IDC STAT BRADY RV PERCENT PACED: 99 %

## 2016-10-20 ENCOUNTER — Telehealth: Payer: Self-pay | Admitting: Oncology

## 2016-10-20 ENCOUNTER — Other Ambulatory Visit: Payer: Medicare Other

## 2016-10-20 ENCOUNTER — Other Ambulatory Visit (HOSPITAL_BASED_OUTPATIENT_CLINIC_OR_DEPARTMENT_OTHER): Payer: Medicare Other

## 2016-10-20 ENCOUNTER — Ambulatory Visit (HOSPITAL_BASED_OUTPATIENT_CLINIC_OR_DEPARTMENT_OTHER): Payer: Medicare Other | Admitting: Oncology

## 2016-10-20 ENCOUNTER — Ambulatory Visit: Payer: Medicare Other | Admitting: Oncology

## 2016-10-20 VITALS — BP 138/101 | HR 87 | Temp 97.9°F | Resp 18 | Ht 63.0 in | Wt 214.5 lb

## 2016-10-20 DIAGNOSIS — Z85820 Personal history of malignant melanoma of skin: Secondary | ICD-10-CM

## 2016-10-20 DIAGNOSIS — C4361 Malignant melanoma of right upper limb, including shoulder: Secondary | ICD-10-CM

## 2016-10-20 LAB — COMPREHENSIVE METABOLIC PANEL
ALT: 30 U/L (ref 0–55)
AST: 25 U/L (ref 5–34)
Albumin: 3.6 g/dL (ref 3.5–5.0)
Alkaline Phosphatase: 88 U/L (ref 40–150)
Anion Gap: 10 mEq/L (ref 3–11)
BUN: 22.4 mg/dL (ref 7.0–26.0)
CALCIUM: 10 mg/dL (ref 8.4–10.4)
CHLORIDE: 109 meq/L (ref 98–109)
CO2: 22 mEq/L (ref 22–29)
Creatinine: 0.8 mg/dL (ref 0.6–1.1)
EGFR: 65 mL/min/{1.73_m2} — AB (ref >90)
Glucose: 125 mg/dl (ref 70–140)
POTASSIUM: 4.1 meq/L (ref 3.5–5.1)
Sodium: 141 mEq/L (ref 136–145)
Total Bilirubin: 0.58 mg/dL (ref 0.20–1.20)
Total Protein: 7.2 g/dL (ref 6.4–8.3)

## 2016-10-20 LAB — CBC WITH DIFFERENTIAL/PLATELET
BASO%: 0.6 % (ref 0.0–2.0)
BASOS ABS: 0.1 10*3/uL (ref 0.0–0.1)
EOS%: 3.7 % (ref 0.0–7.0)
Eosinophils Absolute: 0.3 10*3/uL (ref 0.0–0.5)
HEMATOCRIT: 43.9 % (ref 34.8–46.6)
HGB: 15.3 g/dL (ref 11.6–15.9)
LYMPH%: 31.9 % (ref 14.0–49.7)
MCH: 32.7 pg (ref 25.1–34.0)
MCHC: 34.9 g/dL (ref 31.5–36.0)
MCV: 93.8 fL (ref 79.5–101.0)
MONO#: 0.6 10*3/uL (ref 0.1–0.9)
MONO%: 7.7 % (ref 0.0–14.0)
NEUT#: 4.7 10*3/uL (ref 1.5–6.5)
NEUT%: 56.1 % (ref 38.4–76.8)
Platelets: 285 10*3/uL (ref 145–400)
RBC: 4.68 10*6/uL (ref 3.70–5.45)
RDW: 13.5 % (ref 11.2–14.5)
WBC: 8.3 10*3/uL (ref 3.9–10.3)
lymph#: 2.7 10*3/uL (ref 0.9–3.3)

## 2016-10-20 LAB — LACTATE DEHYDROGENASE: LDH: 187 U/L (ref 125–245)

## 2016-10-20 NOTE — Progress Notes (Signed)
Hematology and Oncology Follow Up Visit  Melissa Novak [id number] [birth year] 82 y.o. 10/20/2016 1:31 PM Melissa Novak, MDStoneking, Hal, MD   Principle Diagnosis: 82 year old woman diagnosed with malignant melanoma on 02/18/2016. She was found to have a lesion on her right forearm and a shave biopsy showed a 4.5 mm malignant melanoma with ulceration.   Prior Therapy: She is status post wide excision done on 03/17/2016. The final pathology showed a maximum tumor thickness of 4.5 mm with negative margins. Ulcerations are present. The tumor infiltration with lymphocytes was described as non-brisk. The mitotic index was 6/mm. Final pathology stage was pT4b NX MX.   Current therapy: Observation and surveillance.  Interim History: Melissa Novak presents today for a follow-up visit. Since her last visit, she reports no major changes in her health. She denied any skin lesions or masses.  She continues to perform all activities of daily living without any decline. Appetite remain excellent and lives independently. She does not report any constitutional symptoms of weight loss, fevers or chills. She continues to follow with dermatology  She does not report any headaches, vision, syncope or seizures. She does not report any fevers, chills sweats. Does not report any cough, wheezing or hemoptysis. Does not report any nausea, vomiting or abdominal pain. Does not report any chest pain, palpitation orthopnea. Does not report any cough, wheezing or hemoptysis. Does not report any nausea, vomiting or abdominal pain. Does not report any gross satiety or change in her bowel habits. She does not report any frequency urgency or hesitancy. Remaining review of system is unremarkable.    Medications: I have reviewed the patient's current medications.  Current Outpatient Prescriptions  Medication Sig Dispense Refill  . amLODipine (NORVASC) 2.5 MG tablet Take 2.5 mg by mouth daily.    . Ascorbic Acid (VITAMIN  C) 1000 MG tablet Take 1,000 mg by mouth daily.    Marland Kitchen aspirin 81 MG tablet Take 81 mg by mouth daily.    Marland Kitchen atorvastatin (LIPITOR) 10 MG tablet Take 1 tablet by mouth daily.    . calcium carbonate (OS-CAL) 600 MG TABS tablet Take 600 mg by mouth 2 (two) times daily with a meal.    . Cholecalciferol (VITAMIN D PO) Take 1 tablet by mouth daily.    Marland Kitchen FEMRING 0.1 MG/24HR RING Place 1 each vaginally See admin instructions. Change every 90 days    . furosemide (LASIX) 20 MG tablet Take 20 mg by mouth daily.    Marland Kitchen GARLIC PO Take 1 tablet by mouth daily.     Marland Kitchen HYDROcodone-acetaminophen (NORCO) 5-325 MG tablet Take 1-2 tablets by mouth every 6 (six) hours as needed for moderate pain or severe pain. 30 tablet 0  . losartan (COZAAR) 50 MG tablet Take 50 mg by mouth 2 (two) times daily.    . Misc Natural Products (OSTEO BI-FLEX JOINT SHIELD PO) Take 1 tablet by mouth daily.     . Multiple Vitamin (MULTIVITAMIN WITH MINERALS) TABS Take 1 tablet by mouth daily.    . Multiple Vitamins-Minerals (ZINC PO) Take 1 tablet by mouth daily.     . Omega-3 Fatty Acids (FISH OIL PO) Take 1 capsule by mouth daily.     . valACYclovir (VALTREX) 1000 MG tablet Take 1,000 mg by mouth daily.     No current facility-administered medications for this visit.      Allergies:  Allergies  Allergen Reactions  . Sulfa Antibiotics Anaphylaxis  . Percocet [Oxycodone-Acetaminophen] Rash  . Tramadol Hives, Nausea Only  and Other (See Comments)    Blurred vision  . Benadryl [Diphenhydramine Hcl] Rash and Other (See Comments)    BLURRED VISION  . Cephalexin Rash  . Codeine Rash  . Demerol Nausea Only and Rash  . Dilaudid [Hydromorphone Hcl] Nausea And Vomiting  . Dimenhydrinate Rash and Other (See Comments)    BLURRED VISION  . Meclizine Hcl Rash and Other (See Comments)    BLURRED VISION  . Penicillins Rash and Other (See Comments)    FEVER  . Pentazocine Lactate Rash and Other (See Comments)    FEVER    Past Medical  History, Surgical history, Social history, and Family History were reviewed and updated.   Physical Exam: Blood pressure (!) 138/101, pulse 87, temperature 97.9 F (36.6 C), temperature source Oral, resp. rate 18, height 5\' 3"  (1.6 m), weight 214 lb 8 oz (97.3 kg), SpO2 93 %. ECOG: 1 General appearance: alert and cooperative appeared without distress. Head: Normocephalic, without obvious abnormality no oral ulcers or lesions. Neck: no adenopathy Lymph nodes: Cervical, supraclavicular, and axillary nodes normal. No lymphadenopathy noted. Heart:regular rate and rhythm, S1, S2 normal, no murmur, click, rub or gallop Lung:chest clear, no wheezing, rales, normal symmetric air entry Abdomin: soft, non-tender, without masses or organomegaly EXT:no erythema, induration, or nodules Skin: No rashes or lesions. Her right forearm incision appear well healed.  Lab Results: Lab Results  Component Value Date   WBC 8.3 10/20/2016   HGB 15.3 10/20/2016   HCT 43.9 10/20/2016   MCV 93.8 10/20/2016   PLT 285 10/20/2016     Chemistry      Component Value Date/Time   NA 141 10/20/2016 1251   K 4.1 10/20/2016 1251   CL 109 03/17/2016 1236   CO2 22 10/20/2016 1251   BUN 22.4 10/20/2016 1251   CREATININE 0.8 10/20/2016 1251      Component Value Date/Time   CALCIUM 10.0 10/20/2016 1251   ALKPHOS 88 10/20/2016 1251   AST 25 10/20/2016 1251   ALT 30 10/20/2016 1251   BILITOT 0.58 10/20/2016 1251      Impression and Plan:  82 year old woman with the following issues:  1. Malignant melanoma diagnosed on 02/18/2016. She had a lesion that had grown over the years and underwent shave biopsy which confirmed the presence of a 4.5 mm malignant melanoma with ulceration. She underwent a nuclear medicine lymphangiography and a sentinel node was not able to be identified.   She is status post wide excision done on 03/17/2016 which showed the final pathology to be T4b with ulceration and non-brisk  lymphocyte tumor infiltration. She declined adjuvant therapy at that time. She continues to be on active surveillance without any evidence of recurrence.  Plan is to continue with periodic monitoring and follow in 12 months. She continues to follow with Dr. Dalbert Batman every 12 months as well. She also has follow up with dermatology.  Systemic therapy will be utilized if she develops metastatic disease.  2. Follow-up: Will be in 12 months sooner if needed to.    [id number], MD 11/30/20171:31 PM

## 2016-10-20 NOTE — Telephone Encounter (Signed)
Appointments scheduled per 11/30 LOS. Patient given AVS report and calendars with future scheduled appointments. °

## 2016-10-24 ENCOUNTER — Telehealth: Payer: Self-pay | Admitting: *Deleted

## 2016-10-24 ENCOUNTER — Other Ambulatory Visit: Payer: Self-pay | Admitting: Geriatric Medicine

## 2016-10-24 ENCOUNTER — Telehealth: Payer: Self-pay | Admitting: Cardiology

## 2016-10-24 DIAGNOSIS — Z1231 Encounter for screening mammogram for malignant neoplasm of breast: Secondary | ICD-10-CM

## 2016-10-24 NOTE — Telephone Encounter (Signed)
Pt states PCP diagnosed her with diabetes, recommended she lose 10 pounds.  Pt states PCP prescribed atorvastatin 10mg  daily.

## 2016-10-24 NOTE — Telephone Encounter (Signed)
New Message:    Pt have been put on Atorvastatin by her primary doctor. She wants you to ask Dr Marlou Porch if this is alright for her to take?

## 2016-10-24 NOTE — Telephone Encounter (Signed)
No note

## 2016-10-24 NOTE — Telephone Encounter (Signed)
Pt states PCP told her because she is diabetic her heart doctor would want her on statin.  Pt asking if Dr Marlou Porch would agree with statin recommendation. Pt advised I will forward to Dr Marlou Porch for review.

## 2016-10-25 NOTE — Telephone Encounter (Signed)
Pt is aware that Dr. Marlou Porch okay for her to start on Atorvastatin. Pt verbalized understanding.

## 2016-10-25 NOTE — Telephone Encounter (Signed)
I'm fine with starting atorvastatin. Thanks  Candee Furbish, MD

## 2016-10-31 DIAGNOSIS — H43812 Vitreous degeneration, left eye: Secondary | ICD-10-CM | POA: Diagnosis not present

## 2016-11-03 DIAGNOSIS — N952 Postmenopausal atrophic vaginitis: Secondary | ICD-10-CM | POA: Diagnosis not present

## 2016-12-07 ENCOUNTER — Ambulatory Visit: Payer: Medicare Other

## 2016-12-12 ENCOUNTER — Ambulatory Visit (INDEPENDENT_AMBULATORY_CARE_PROVIDER_SITE_OTHER): Payer: Medicare Other | Admitting: *Deleted

## 2016-12-12 DIAGNOSIS — H43812 Vitreous degeneration, left eye: Secondary | ICD-10-CM | POA: Diagnosis not present

## 2016-12-12 DIAGNOSIS — H26492 Other secondary cataract, left eye: Secondary | ICD-10-CM | POA: Diagnosis not present

## 2016-12-12 DIAGNOSIS — Z0100 Encounter for examination of eyes and vision without abnormal findings: Secondary | ICD-10-CM | POA: Diagnosis not present

## 2016-12-12 DIAGNOSIS — I442 Atrioventricular block, complete: Secondary | ICD-10-CM | POA: Diagnosis not present

## 2016-12-12 DIAGNOSIS — Z961 Presence of intraocular lens: Secondary | ICD-10-CM | POA: Diagnosis not present

## 2016-12-12 NOTE — Progress Notes (Signed)
Remote pacemaker transmission.   

## 2016-12-13 LAB — CUP PACEART REMOTE DEVICE CHECK
Battery Voltage: 2.92 V
Brady Statistic AS VP Percent: 99 %
Brady Statistic AS VS Percent: 1 %
Date Time Interrogation Session: 20180122112120
Implantable Lead Implant Date: 20130408
Implantable Lead Location: 753859
Lead Channel Pacing Threshold Amplitude: 0.875 V
Lead Channel Pacing Threshold Pulse Width: 0.4 ms
Lead Channel Sensing Intrinsic Amplitude: 12 mV
Lead Channel Setting Pacing Amplitude: 0.75 V
Lead Channel Setting Pacing Amplitude: 1.875
MDC IDC LEAD IMPLANT DT: 20130408
MDC IDC LEAD LOCATION: 753860
MDC IDC MSMT BATTERY REMAINING LONGEVITY: 94 mo
MDC IDC MSMT BATTERY REMAINING PERCENTAGE: 73 %
MDC IDC MSMT LEADCHNL RA IMPEDANCE VALUE: 390 Ohm
MDC IDC MSMT LEADCHNL RA PACING THRESHOLD PULSEWIDTH: 0.4 ms
MDC IDC MSMT LEADCHNL RA SENSING INTR AMPL: 5 mV
MDC IDC MSMT LEADCHNL RV IMPEDANCE VALUE: 460 Ohm
MDC IDC MSMT LEADCHNL RV PACING THRESHOLD AMPLITUDE: 0.5 V
MDC IDC PG IMPLANT DT: 20130408
MDC IDC SET LEADCHNL RV PACING PULSEWIDTH: 0.4 ms
MDC IDC SET LEADCHNL RV SENSING SENSITIVITY: 4 mV
MDC IDC STAT BRADY AP VP PERCENT: 1 %
MDC IDC STAT BRADY AP VS PERCENT: 1 %
MDC IDC STAT BRADY RA PERCENT PACED: 1 %
MDC IDC STAT BRADY RV PERCENT PACED: 99 %
Pulse Gen Model: 2210
Pulse Gen Serial Number: 7338769

## 2016-12-14 ENCOUNTER — Encounter: Payer: Self-pay | Admitting: Cardiology

## 2016-12-23 ENCOUNTER — Telehealth: Payer: Self-pay | Admitting: Internal Medicine

## 2016-12-23 NOTE — Telephone Encounter (Signed)
Called pt back and let her know that her remote had been received and reviewed by a device tech and then sent to the doctor and if there was anything that needed further attention then she would of had a call. Pt voiced understanding

## 2016-12-23 NOTE — Telephone Encounter (Signed)
New message       Talk to someone in device about letter she received

## 2016-12-30 ENCOUNTER — Ambulatory Visit: Payer: Medicare Other

## 2017-01-05 ENCOUNTER — Ambulatory Visit: Payer: Medicare Other

## 2017-01-26 ENCOUNTER — Telehealth: Payer: Self-pay | Admitting: Internal Medicine

## 2017-01-26 NOTE — Telephone Encounter (Signed)
Called, spoke with pt. Pt stated she is experiencing SOB upon exertion. Symptoms started 5-6 days ago when she returned from her trip to [place]. Pt stated she recently lost weight, but gain it back since her trip. Pt stated she went out to eat a lot while in [place]. Pt stated she weighs a couple of times a week. Pt stated her most recent weight was a couple of days ago - 213 lbs. Pt denies CP. Pt does not have a way to check BP or O2 sats from home. Pt stated she sees PCP for BP checks. Informed I will check with [facility] to see resulst of the last transmission. I will call pt back with recommendation.

## 2017-01-26 NOTE — Telephone Encounter (Signed)
New Message   Pt c/o Shortness Of Breath: STAT if SOB developed within the last 24 hours or pt is noticeably SOB on the phone  1. Are you currently SOB (can you hear that pt is SOB on the phone)? Some (voice sounds scratchy) pt stated this has been happening due to change in weather  2. How long have you been experiencing SOB? 3 weeks   3. Are you SOB when sitting or when up moving around? both  4. Are you currently experiencing any other symptoms? Since she has been back from [place] she has been having a problem with SOB

## 2017-01-26 NOTE — Telephone Encounter (Signed)
Called, spoke with pt. Informed pt I checked with [facility] and PPM does not a way to check fluid levels. Forwarded information to DOD, Dr. Irish Lack. Dr. Irish Lack recommended pt to increase Lasix to 40 mg daily for 3 days. Informed pt to call our office if symptoms do not improve. Pt verbalized understanding.

## 2017-03-03 DIAGNOSIS — J984 Other disorders of lung: Secondary | ICD-10-CM | POA: Diagnosis not present

## 2017-03-03 DIAGNOSIS — E119 Type 2 diabetes mellitus without complications: Secondary | ICD-10-CM | POA: Diagnosis not present

## 2017-03-03 DIAGNOSIS — I10 Essential (primary) hypertension: Secondary | ICD-10-CM | POA: Diagnosis not present

## 2017-03-03 DIAGNOSIS — Z6841 Body Mass Index (BMI) 40.0 and over, adult: Secondary | ICD-10-CM | POA: Diagnosis not present

## 2017-03-03 DIAGNOSIS — M25562 Pain in left knee: Secondary | ICD-10-CM | POA: Diagnosis not present

## 2017-03-03 DIAGNOSIS — M25561 Pain in right knee: Secondary | ICD-10-CM | POA: Diagnosis not present

## 2017-03-03 DIAGNOSIS — Z79899 Other long term (current) drug therapy: Secondary | ICD-10-CM | POA: Diagnosis not present

## 2017-03-08 ENCOUNTER — Ambulatory Visit: Payer: Medicare Other

## 2017-03-09 DIAGNOSIS — E119 Type 2 diabetes mellitus without complications: Secondary | ICD-10-CM | POA: Diagnosis not present

## 2017-03-09 DIAGNOSIS — Z9049 Acquired absence of other specified parts of digestive tract: Secondary | ICD-10-CM | POA: Diagnosis not present

## 2017-03-09 DIAGNOSIS — I10 Essential (primary) hypertension: Secondary | ICD-10-CM | POA: Diagnosis not present

## 2017-03-09 DIAGNOSIS — Z950 Presence of cardiac pacemaker: Secondary | ICD-10-CM | POA: Diagnosis not present

## 2017-03-09 DIAGNOSIS — Z6839 Body mass index (BMI) 39.0-39.9, adult: Secondary | ICD-10-CM | POA: Diagnosis not present

## 2017-03-09 DIAGNOSIS — C4361 Malignant melanoma of right upper limb, including shoulder: Secondary | ICD-10-CM | POA: Diagnosis not present

## 2017-03-09 DIAGNOSIS — M79601 Pain in right arm: Secondary | ICD-10-CM | POA: Diagnosis not present

## 2017-03-13 ENCOUNTER — Other Ambulatory Visit: Payer: Self-pay | Admitting: General Surgery

## 2017-03-13 DIAGNOSIS — M25511 Pain in right shoulder: Secondary | ICD-10-CM

## 2017-03-13 DIAGNOSIS — M79601 Pain in right arm: Secondary | ICD-10-CM

## 2017-03-17 ENCOUNTER — Ambulatory Visit
Admission: RE | Admit: 2017-03-17 | Discharge: 2017-03-17 | Disposition: A | Discharge status: Home / Self Care | Payer: Medicare Other | Source: Ambulatory Visit | Attending: General Surgery | Admitting: General Surgery

## 2017-03-17 DIAGNOSIS — M25511 Pain in right shoulder: Secondary | ICD-10-CM

## 2017-03-17 DIAGNOSIS — M79601 Pain in right arm: Secondary | ICD-10-CM

## 2017-03-27 ENCOUNTER — Encounter: Payer: Self-pay | Admitting: Internal Medicine

## 2017-03-29 ENCOUNTER — Ambulatory Visit: Payer: Medicare Other

## 2017-04-11 ENCOUNTER — Encounter: Payer: Self-pay | Admitting: Internal Medicine

## 2017-04-11 ENCOUNTER — Ambulatory Visit (INDEPENDENT_AMBULATORY_CARE_PROVIDER_SITE_OTHER): Payer: Medicare Other | Admitting: Internal Medicine

## 2017-04-11 VITALS — BP 144/78 | HR 93 | Ht 62.0 in | Wt 218.2 lb

## 2017-04-11 DIAGNOSIS — R0602 Shortness of breath: Secondary | ICD-10-CM

## 2017-04-11 DIAGNOSIS — R001 Bradycardia, unspecified: Secondary | ICD-10-CM

## 2017-04-11 NOTE — Progress Notes (Signed)
HPI Melissa Novak returns today for followup of her PPM. She is an 83 yo woman with longstandingCHB, s/p PPM insertion, DM, and HTN. She is overweight. She admits to being fairly sedentary. She denies syncope. In the interim, she has been diagnosed with DM.  Allergies  Allergen Reactions  . Sulfa Antibiotics Anaphylaxis  . Percocet [Oxycodone-Acetaminophen] Rash  . Tramadol Hives, Nausea Only and Other (See Comments)    Blurred vision  . Metformin And Related Diarrhea  . Benadryl [Diphenhydramine Hcl] Rash and Other (See Comments)    BLURRED VISION  . Cephalexin Rash  . Codeine Rash  . Demerol Nausea Only and Rash  . Dilaudid [Hydromorphone Hcl] Nausea And Vomiting  . Dimenhydrinate Rash and Other (See Comments)    BLURRED VISION  . Meclizine Hcl Rash and Other (See Comments)    BLURRED VISION  . Penicillins Rash and Other (See Comments)    FEVER  . Pentazocine Lactate Rash and Other (See Comments)    FEVER     Current Outpatient Prescriptions  Medication Sig Dispense Refill  . amLODipine (NORVASC) 2.5 MG tablet Take 2.5 mg by mouth daily.    . Ascorbic Acid (VITAMIN C) 1000 MG tablet Take 1,000 mg by mouth daily.    Marland Kitchen aspirin 81 MG tablet Take 81 mg by mouth daily.    Marland Kitchen atorvastatin (LIPITOR) 10 MG tablet Take 1 tablet by mouth daily.    . calcium carbonate (OS-CAL) 600 MG TABS tablet Take 600 mg by mouth daily.     . Cholecalciferol (VITAMIN D PO) Take 1 tablet by mouth daily.    Marland Kitchen FEMRING 0.1 MG/24HR RING Place 1 each vaginally See admin instructions. Change every 90 days    . furosemide (LASIX) 20 MG tablet Take 20 mg by mouth daily.    Marland Kitchen GARLIC PO Take 1 tablet by mouth daily.     Marland Kitchen HYDROcodone-acetaminophen (NORCO) 5-325 MG tablet Take 1-2 tablets by mouth every 6 (six) hours as needed for moderate pain or severe pain. 30 tablet 0  . losartan (COZAAR) 50 MG tablet Take 50 mg by mouth 2 (two) times daily.    . Misc Natural Products (OSTEO BI-FLEX JOINT SHIELD PO)  Take 1 tablet by mouth daily.     . Multiple Vitamin (MULTIVITAMIN WITH MINERALS) TABS Take 1 tablet by mouth daily.    . Multiple Vitamins-Minerals (ZINC PO) Take 1 tablet by mouth daily.     . Saccharomyces boulardii (FLORASTOR PO) Take 1 capsule by mouth daily.    . valACYclovir (VALTREX) 1000 MG tablet Take 1,000 mg by mouth daily.     No current facility-administered medications for this visit.      Past Medical History:  Diagnosis Date  . Cancer ([place])    melanoma right forearm  . Complete heart block ([place])   . Coronary artery disease CARDIOLOGIST- DR Gillian Shields-  LAST VISIT THIS WEEK--  WILL REQUEST NOTE, STRESS TEST    PT DENIES S & S  . Cough    pt states has had cough  5-7 weeks. pcp aware  . Diverticulosis   . History of pulmonary embolism 1970'S  . History of shingles 2004 --  EYES  , NO RESIDUAL PAIN  . Hypertension   . LBBB (left bundle branch block)   . Malignant melanoma of right forearm ([place]) 03/17/2016  . Pacemaker 4/13   DDD St Jude  . PONV (postoperative nausea and vomiting)    pt states no  n/v w 4/13 surgery  . Prolapse of anterior vaginal wall     ROS:   All systems reviewed and negative except as noted in the HPI.   Past Surgical History:  Procedure Laterality Date  . BREAST SURGERY     biopsy  . CARDIOVASCULAR STRESS TEST  02-14-2012  DR SKAINS   LOW RISK NUCLEAR TEST/ MILD FIXED DEFECT ALONG THE DISTAL ANTERIOR SEPTAL WALL CONSTISTENT WITH LBBB/ NO ISCHEMIA   . CATARACT EXTRACTION W/ INTRAOCULAR LENS  IMPLANT, BILATERAL    . CHOLECYSTECTOMY  1967  . INSERT / REPLACE / REMOVE PACEMAKER  4/13   ddd St Jude pacer,d/t symptomatic bradycardia w intermittent complete heart block.  Inserted By Dr. Crissie Sickles 4/13  . MELANOMA EXCISION Right 03/17/2016   Procedure: WIDE EXCISION MELANOMA RIGHT FOREARM;  Surgeon: Fanny Skates, MD;  Location: [facility];  Service: General;  Laterality: Right;  . PERMANENT PACEMAKER INSERTION N/A 02/27/2012   Procedure: PERMANENT  PACEMAKER INSERTION;  Surgeon: Evans Lance, MD;  Location: [facility] CATH LAB;  Service: Cardiovascular;  Laterality: N/A;  . 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 PUBOVAGINAL SLING  12/07/2012   Procedure: Gaynelle Arabian;  Surgeon: Ailene Rud, MD;  Location[facility];  Service: Urology;  Laterality: N/A;  solyx sling   . TRANSTHORACIC ECHOCARDIOGRAM  02-15-2012  DR SKAINS   NORMAL EF 60-65%/ MILD AORTIC SCLEROSIS, NO STENOSIS, GRADE I DIASTOLIC DYSFUNCTION  . VAGINAL HYSTERECTOMY  1974   STILL HAS OVARIES     Family History  Problem Relation Age of Onset  . Cancer Mother   . Cancer Father   . Kidney disease Son   . Cancer Brother   . Kidney disease Brother      Social History   Social History  . Marital status: Widowed    Spouse name: N/A  . Number of children: N/A  . Years of education: N/A   Occupational History  . Not on file.   Social History Main Topics  . Smoking status: Former Research scientist (life sciences)  . Smokeless tobacco: Never Used     Comment: have not smoked in 50 years 03/16/16  . Alcohol use Yes     Comment: RARE  . Drug use: No  . Sexual activity: Not on file   Other Topics Concern  . Not on file   Social History Narrative  . No narrative on file     BP (!) 144/78   Pulse 93   Ht 5\' 2"  (1.575 m)   Wt 218 lb 3.2 oz (99 kg)   BMI 39.91 kg/m   Physical Exam:  Well appearing obese woman, NAD HEENT: Unremarkable Neck:  7 cm JVD, no thyromegally Lymphatics:  No adenopathy Back:  No CVA tenderness Lungs:  Clear with basilar rales.  HEART:  Regular rate rhythm, no murmurs, no rubs, no clicks Abd:  soft, positive bowel sounds, no organomegally, no rebound, no guarding Ext:  2 plus pulses, no edema, no cyanosis, no clubbing Skin:  No rashes  no nodules Neuro:  CN II through XII intact, motor grossly intact  EKG - none today  DEVICE  Normal device function.  See PaceArt for details.   Assess/Plan: 1. CHB - she is doing well, s/p PPM insertion 2. PPM - her St. Jude DDD  PM is working normally.  3. HTN - her BP is elevated and I have encouraged her to maintain a low salt diet and to lose weight.   Mikle Bosworth.D.

## 2017-04-11 NOTE — Patient Instructions (Addendum)
Medication Instructions:  Your physician recommends that you continue on your current medications as directed. Please refer to the Current Medication list given to you today.   Labwork: None Ordered   Testing/Procedures: Your physician has requested that you have an echocardiogram. Echocardiography is a painless test that uses sound waves to create images of your heart. It provides your doctor with information about the size and shape of your heart and how well your heart's chambers and valves are working. This procedure takes approximately one hour. There are no restrictions for this procedure.     Follow-Up: Your physician wants you to follow-up in: 1 year with Dr. Lovena Le. You will receive a reminder letter in the mail two months in advance. If you don't receive a letter, please call our office to schedule the follow-up appointment.  Remote monitoring is used to monitor your Pacemaker from home. This monitoring reduces the number of office visits required to check your device to one time per year. It allows [place] to keep an eye on the functioning of your device to ensure it is working properly. You are scheduled for a device check from home on 04/10/17. You may send your transmission at any time that day. If you have a wireless device, the transmission will be sent automatically. After your physician reviews your transmission, you will receive a postcard with your next transmission date.    Any Other Special Instructions Will Be Listed Below (If Applicable).     If you need a refill on your cardiac medications before your next appointment, please call your pharmacy.

## 2017-04-12 ENCOUNTER — Telehealth: Payer: Self-pay | Admitting: Internal Medicine

## 2017-04-12 NOTE — Telephone Encounter (Signed)
Pt called with questions about her 5/22 visit please give her a call back.

## 2017-04-13 DIAGNOSIS — L400 Psoriasis vulgaris: Secondary | ICD-10-CM | POA: Diagnosis not present

## 2017-04-13 DIAGNOSIS — Z85820 Personal history of malignant melanoma of skin: Secondary | ICD-10-CM | POA: Diagnosis not present

## 2017-04-13 DIAGNOSIS — L820 Inflamed seborrheic keratosis: Secondary | ICD-10-CM | POA: Diagnosis not present

## 2017-04-13 DIAGNOSIS — L821 Other seborrheic keratosis: Secondary | ICD-10-CM | POA: Diagnosis not present

## 2017-04-13 DIAGNOSIS — D485 Neoplasm of uncertain behavior of skin: Secondary | ICD-10-CM | POA: Diagnosis not present

## 2017-04-13 NOTE — Telephone Encounter (Signed)
Called, spoke with pt. Pt asked if she need to do a transmission of her device prior to her echocardiogram. Informed her she did not have to. Next transmission will be 07/11/17. Pt verbalized understanding and thanked me for calling.

## 2017-04-19 ENCOUNTER — Ambulatory Visit: Payer: Medicare Other

## 2017-04-26 ENCOUNTER — Other Ambulatory Visit (HOSPITAL_COMMUNITY): Payer: Medicare Other

## 2017-04-28 ENCOUNTER — Ambulatory Visit: Payer: Medicare Other

## 2017-05-02 DIAGNOSIS — N8111 Cystocele, midline: Secondary | ICD-10-CM | POA: Diagnosis not present

## 2017-05-05 ENCOUNTER — Ambulatory Visit (HOSPITAL_COMMUNITY): Payer: Medicare Other | Attending: Internal Medicine

## 2017-05-05 DIAGNOSIS — R0602 Shortness of breath: Secondary | ICD-10-CM | POA: Diagnosis not present

## 2017-05-06 DIAGNOSIS — S61209A Unspecified open wound of unspecified finger without damage to nail, initial encounter: Secondary | ICD-10-CM | POA: Diagnosis not present

## 2017-05-06 DIAGNOSIS — E7800 Pure hypercholesterolemia, unspecified: Secondary | ICD-10-CM | POA: Diagnosis not present

## 2017-05-06 DIAGNOSIS — E119 Type 2 diabetes mellitus without complications: Secondary | ICD-10-CM | POA: Diagnosis not present

## 2017-05-06 DIAGNOSIS — I10 Essential (primary) hypertension: Secondary | ICD-10-CM | POA: Diagnosis not present

## 2017-05-10 ENCOUNTER — Telehealth: Payer: Self-pay | Admitting: Internal Medicine

## 2017-05-10 DIAGNOSIS — S61209D Unspecified open wound of unspecified finger without damage to nail, subsequent encounter: Secondary | ICD-10-CM | POA: Diagnosis not present

## 2017-05-10 NOTE — Telephone Encounter (Signed)
New message   Pt states that she has not been given her results for her echo yet and is checking on the status

## 2017-05-10 NOTE — Telephone Encounter (Signed)
Received message in triage, unable to locate the study in Dr. Tanna Furry box.  Routing message to Dr. Lovena Le for review.

## 2017-05-11 ENCOUNTER — Ambulatory Visit: Payer: Medicare Other

## 2017-05-11 DIAGNOSIS — S61214A Laceration without foreign body of right ring finger without damage to nail, initial encounter: Secondary | ICD-10-CM | POA: Diagnosis not present

## 2017-05-12 NOTE — Telephone Encounter (Signed)
Follow up  Pt voiced calling to receive results on Echo she had on 05/05/2017.

## 2017-05-18 ENCOUNTER — Ambulatory Visit (INDEPENDENT_AMBULATORY_CARE_PROVIDER_SITE_OTHER): Payer: Medicare Other | Admitting: Internal Medicine

## 2017-05-18 ENCOUNTER — Encounter (INDEPENDENT_AMBULATORY_CARE_PROVIDER_SITE_OTHER): Payer: Self-pay

## 2017-05-18 ENCOUNTER — Encounter: Payer: Self-pay | Admitting: Internal Medicine

## 2017-05-18 VITALS — BP 132/84 | HR 91 | Ht 62.0 in | Wt 217.2 lb

## 2017-05-18 DIAGNOSIS — R0602 Shortness of breath: Secondary | ICD-10-CM | POA: Diagnosis not present

## 2017-05-18 DIAGNOSIS — Z79899 Other long term (current) drug therapy: Secondary | ICD-10-CM

## 2017-05-18 DIAGNOSIS — Z950 Presence of cardiac pacemaker: Secondary | ICD-10-CM | POA: Diagnosis not present

## 2017-05-18 DIAGNOSIS — R001 Bradycardia, unspecified: Secondary | ICD-10-CM

## 2017-05-18 MED ORDER — FUROSEMIDE 40 MG PO TABS: 60.0000 mg | ORAL_TABLET | Freq: Every day | ORAL | 6 refills | Status: DC

## 2017-05-18 NOTE — Patient Instructions (Addendum)
Medication Instructions:  Increased Lasix to 60 mg daily   Labwork: None Ordered   Testing/Procedures: BMET in 1 week   Follow-Up: Your physician wants you to follow-up in: 6 months with Dr. Lovena Le. You will receive a reminder letter in the mail two months in advance. If you don't receive a letter, please call our office to schedule the follow-up appointment.  Remote monitoring is used to monitor your Pacemaker from home. This monitoring reduces the number of office visits required to check your device to one time per year. It allows [place] to keep an eye on the functioning of your device to ensure it is working properly. You are scheduled for a device check from home on  08/17/17 . You may send your transmission at any time that day. If you have a wireless device, the transmission will be sent automatically. After your physician reviews your transmission, you will receive a postcard with your next transmission date.    Any Other Special Instructions Will Be Listed Below (If Applicable). Call our office to let us know if medication increase is NOT working. Will need Bi-V Upgrade. Please call our office to inform - [phone]     If you need a refill on your cardiac medications before your next appointment, please call your pharmacy.

## 2017-05-19 ENCOUNTER — Telehealth: Payer: Self-pay | Admitting: Internal Medicine

## 2017-05-19 NOTE — Progress Notes (Signed)
HPI [name] returns today for followup of her PPM. She is an 83 yo woman with longstanding CHB, s/p PPM insertion, DM, and HTN. She is overweight. She admits to being fairly sedentary. She denies syncope. In the interim, she has been diagnosed with DM. She has had worsening dyspnea with exertion. No edema. No chest pain. She notes that with any exertion she will get sob. She had a 2D echo which demonstrates that her EF has gone down to 35%.  Allergies  Allergen Reactions  . Sulfa Antibiotics Anaphylaxis  . Percocet [Oxycodone-Acetaminophen] Rash  . Tramadol Hives, Nausea Only and Other (See Comments)    Blurred vision  . Benadryl [Diphenhydramine Hcl] Rash and Other (See Comments)    BLURRED VISION  . Cephalexin Rash  . Codeine Rash  . Demerol Nausea Only and Rash  . Dilaudid [Hydromorphone Hcl] Nausea And Vomiting  . Dimenhydrinate Rash and Other (See Comments)    BLURRED VISION  . Meclizine Hcl Rash and Other (See Comments)    BLURRED VISION  . Penicillins Rash and Other (See Comments)    FEVER  . Pentazocine Lactate Rash and Other (See Comments)    FEVER     Current Outpatient Prescriptions  Medication Sig Dispense Refill  . amLODipine (NORVASC) 2.5 MG tablet Take 2.5 mg by mouth daily.    . Ascorbic Acid (VITAMIN C) 1000 MG tablet Take 1,000 mg by mouth daily.    Marland Kitchen aspirin 81 MG tablet Take 81 mg by mouth daily.    Marland Kitchen atorvastatin (LIPITOR) 10 MG tablet Take 1 tablet by mouth daily.    . calcium carbonate (OS-CAL) 600 MG TABS tablet Take 600 mg by mouth daily.     . Cholecalciferol (VITAMIN D PO) Take 1 tablet by mouth daily.    Marland Kitchen FEMRING 0.1 MG/24HR RING Place 1 each vaginally See admin instructions. Change every 90 days    . GARLIC PO Take 1 tablet by mouth daily.     Marland Kitchen HYDROcodone-acetaminophen (NORCO) 5-325 MG tablet Take 1-2 tablets by mouth every 6 (six) hours as needed for moderate pain or severe pain. 30 tablet 0  . losartan (COZAAR) 50 MG tablet Take 50  mg by mouth 2 (two) times daily.    . metFORMIN (GLUCOPHAGE-XR) 500 MG 24 hr tablet Take 500 mg by mouth daily with supper.  3  . Misc Natural Products (OSTEO BI-FLEX JOINT SHIELD PO) Take 1 tablet by mouth daily.     . Multiple Vitamin (MULTIVITAMIN WITH MINERALS) TABS Take 1 tablet by mouth daily.    . Multiple Vitamins-Minerals (ZINC PO) Take 1 tablet by mouth daily.     Marland Kitchen MYRBETRIQ 50 MG TB24 tablet Take 50 mg by mouth daily.  11  . Saccharomyces boulardii (FLORASTOR PO) Take 1 capsule by mouth daily.    . valACYclovir (VALTREX) 1000 MG tablet Take 1,000 mg by mouth daily.    . furosemide (LASIX) 40 MG tablet Take 1.5 tablets (60 mg total) by mouth daily. 45 tablet 6   No current facility-administered medications for this visit.      Past Medical History:  Diagnosis Date  . Cancer ([place])    melanoma right forearm  . Complete heart block ([place])   . Coronary artery disease CARDIOLOGIST- DR Gillian Shields-  LAST VISIT THIS WEEK--  WILL REQUEST NOTE, STRESS TEST    PT DENIES S & S  . Cough    pt states has had cough  5-7 weeks.  pcp aware  . Diverticulosis   . History of pulmonary embolism 1970'S  . History of shingles 2004 --  EYES  , NO RESIDUAL PAIN  . Hypertension   . LBBB (left bundle branch block)   . Malignant melanoma of right forearm ([place]) 03/17/2016  . Pacemaker 4/13   DDD St Jude  . PONV (postoperative nausea and vomiting)    pt states no n/v w 4/13 surgery  . Prolapse of anterior vaginal wall     ROS:   All systems reviewed and negative except as noted in the HPI.   Past Surgical History:  Procedure Laterality Date  . BREAST SURGERY     biopsy  . CARDIOVASCULAR STRESS TEST  02-14-2012  DR SKAINS   LOW RISK NUCLEAR TEST/ MILD FIXED DEFECT ALONG THE DISTAL ANTERIOR SEPTAL WALL CONSTISTENT WITH LBBB/ NO ISCHEMIA   . CATARACT EXTRACTION W/ INTRAOCULAR LENS  IMPLANT, BILATERAL    . CHOLECYSTECTOMY  1967  . INSERT / REPLACE / REMOVE PACEMAKER  4/13   ddd St Jude pacer,d/t  symptomatic bradycardia w intermittent complete heart block.  Inserted By Dr. Crissie Sickles 4/13  . MELANOMA EXCISION Right 03/17/2016   Procedure: WIDE EXCISION MELANOMA RIGHT FOREARM;  Surgeon: Fanny Skates, MD;  Location: [place];  Service: General;  Laterality: Right;  . PERMANENT PACEMAKER INSERTION N/A 02/27/2012   Procedure: PERMANENT PACEMAKER INSERTION;  Surgeon: Evans Lance, MD;  Location: [facility] CATH LAB;  Service: Cardiovascular;  Laterality: N/A;  . 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 PUBOVAGINAL SLING  12/07/2012   Procedure: Gaynelle Arabian;  Surgeon: Ailene Rud, MD;  Location: [facility];  Service: Urology;  Laterality: N/A;  solyx sling   . TRANSTHORACIC ECHOCARDIOGRAM  02-15-2012  DR SKAINS   NORMAL EF 60-65%/ MILD AORTIC SCLEROSIS, NO STENOSIS, GRADE I DIASTOLIC DYSFUNCTION  . VAGINAL HYSTERECTOMY  1974   STILL HAS OVARIES     Family History  Problem Relation Age of Onset  . Cancer Mother   . Cancer Father   . Kidney disease Son   . Cancer Brother   . Kidney disease Brother      Social History   Social History  . Marital status: Widowed    Spouse name: N/A  . Number of children: N/A  . Years of education: N/A   Occupational History  . Not on file.   Social History Main Topics  . Smoking status: Former Research scientist (life sciences)  . Smokeless tobacco: Never Used     Comment: have not smoked in 50 years 03/16/16  . Alcohol use Yes     Comment: RARE  . Drug use: No  . Sexual activity: Not on file   Other Topics Concern  . Not on file   Social History Narrative  . No narrative on file     BP 132/84   Pulse 91   Ht 5\' 2"  (1.575 m)   Wt 217 lb 3.2 oz (98.5 kg)   SpO2 94%   BMI 39.73 kg/m   Physical Exam:  Well appearing obese woman,  NAD HEENT: Unremarkable Neck:  7 cm JVD, no thyromegally Lymphatics:  No adenopathy Back:  No CVA tenderness Lungs:  Clear with basilar rales.  HEART:  Regular rate rhythm, no murmurs, no rubs, no clicks Abd:  soft, positive bowel sounds, no organomegally, no rebound, no guarding Ext:  2 plus pulses, no edema, no cyanosis, no clubbing Skin:  No rashes no nodules Neuro:  CN II through XII intact, motor grossly intact  EKG - none  DEVICE  Normal device function.  See PaceArt for details.   Assess/Plan: 1. CHB - she is stable, s/p PPM insertion 2. PPM - her St. Jude DDD PM is working normally.  3. HTN - her BP is elevated and I have encouraged her to maintain a low salt diet and to lose weight.  4. New LV dysfunction with associated class 3 symptoms. I have increased her lasix from 20 mg to 60 mg daily. I have discussed proceeding with BiV upgrade. She will undergo watchful waiting and is instructed to call us if she would like to proceed with biv upgrade.  Mikle Bosworth.D.

## 2017-05-19 NOTE — Telephone Encounter (Signed)
Received incoming call from pt. Pt wants to know if which remote date to keep. 07/11/17. Informed I will forward to [facility] to advise.

## 2017-05-19 NOTE — Telephone Encounter (Signed)
Called, pt unavailable. Unable to leave voice message.

## 2017-05-25 ENCOUNTER — Telehealth: Payer: Self-pay | Admitting: Internal Medicine

## 2017-05-25 NOTE — Telephone Encounter (Signed)
Melissa Novak is calling because she just saw Dr.Taylor and wants to speak to you about her heart issues . Please call .Marland Kitchen Thanks

## 2017-05-25 NOTE — Telephone Encounter (Signed)
Follow Up   Pt wants to know if Dr. Lovena Le will give her a handicap parking plaque

## 2017-05-25 NOTE — Telephone Encounter (Signed)
Called, spoke with pt. Pt stated she feels slightly better breathing since taking increased dose of Lasix 60 mg daily. IPt stated she is going on vacation soon and will be out of town until the end of July.nformed pt to touch base with our office when pt is back in town. If breathing has not improved, Bi-V upgrade is recommended.  Also, pt stated she would like her medical records from our office to be sent to PCP and Oncologist. Inquired if pt filled out a medical release form with our office. Pt stated "no". Informed pt to fill out form when she comes in for her lab work on 05/29/17. Pt verbalized understanding and agreed with plan.

## 2017-05-25 NOTE — Telephone Encounter (Signed)
Called, spoke with pt. Informed pt to drop off the disability placard form to our office. Pt stated she will drop off on 05/29/17. Pt is going out of town on 06/01/17, and would like the form filled out prior to leaving.  . I will review with Dr. Lovena Le and call back when complete. Pt verbalized understanding.

## 2017-05-25 NOTE — Telephone Encounter (Signed)
Informed pt to keep remote date 07/11/17.

## 2017-05-26 NOTE — Telephone Encounter (Signed)
Called, spoke with pt. Pt gave [place] a number to reach her while on vacation - [phone]. Informed I will add to contact info. Pt verbalized understanding.

## 2017-05-26 NOTE — Telephone Encounter (Signed)
Follow up     Pt calling stating that she is going out of town and would like to talk to RN about her travel plans.

## 2017-05-29 ENCOUNTER — Other Ambulatory Visit: Payer: Medicare Other

## 2017-05-29 DIAGNOSIS — R0602 Shortness of breath: Secondary | ICD-10-CM

## 2017-05-29 DIAGNOSIS — Z79899 Other long term (current) drug therapy: Secondary | ICD-10-CM

## 2017-05-30 LAB — BASIC METABOLIC PANEL
BUN/Creatinine Ratio: 33 — ABNORMAL HIGH (ref 12–28)
BUN: 30 mg/dL — ABNORMAL HIGH (ref 8–27)
CALCIUM: 10.6 mg/dL — AB (ref 8.7–10.3)
CO2: 25 mmol/L (ref 20–29)
CREATININE: 0.92 mg/dL (ref 0.57–1.00)
Chloride: 100 mmol/L (ref 96–106)
GFR calc Af Amer: 68 mL/min/{1.73_m2} (ref >59)
GFR, EST NON AFRICAN AMERICAN: 59 mL/min/{1.73_m2} — AB (ref >59)
Glucose: 133 mg/dL — ABNORMAL HIGH (ref 65–99)
POTASSIUM: 4.5 mmol/L (ref 3.5–5.2)
Sodium: 144 mmol/L (ref 134–144)

## 2017-06-01 ENCOUNTER — Telehealth: Payer: Self-pay

## 2017-06-01 NOTE — Telephone Encounter (Signed)
Call placed to Pt.  Call went to VM.  DPR on file-left detailed message relaying results of BMET.  Per Dr. Marge Duncans med changes.  Left this nurse name and # for call back if any questions.

## 2017-06-07 ENCOUNTER — Telehealth: Payer: Self-pay

## 2017-06-07 ENCOUNTER — Telehealth: Payer: Self-pay | Admitting: Internal Medicine

## 2017-06-07 NOTE — Telephone Encounter (Signed)
Patient dropped off handicapped renewal form to be filled out and signed my Dr. Lovena Le.    This has been done and filled out. Will call patient to advise it is ready for pickup.

## 2017-06-07 NOTE — Telephone Encounter (Signed)
Patient called in asking for records to be sent to 3 different providers. Made her aware release form needs to be signed I have mailed pt 3 release forms to her home address.

## 2017-06-08 ENCOUNTER — Telehealth: Payer: Self-pay | Admitting: Internal Medicine

## 2017-06-08 NOTE — Telephone Encounter (Signed)
Pt is calling, as instructed too by Dr Lovena Le and covering LPN, to inform if her breathing has improved since recent increase in lasix (lasix 60 mg po daily).  Pt states they wanted to know this, for this will help make the decision whether or not she will need a Bi-V upgrade.  Per the pt, her breathing has mildly improved, but when she exerts, she still gets SOB.  Pt reports she has no SOB at rest, only DOE.  Pt states she is really trying hard to avoid getting this procedure done, if possible.  Pt understands that if this is the only way to address her symptoms, then she will agree to do so.   Pt would like to come in and speak with Dr Lovena Le about this, to obtain a better understanding. Pt requesting that I pass this message along to both Dr Lovena Le and Nurse.  Informed the pt that both Dr Lovena Le and Nurse are out of the office today, but I will route this message to them for further review and follow-up with the pt, upon return to the office.  Advised the pt to continue her current med regimen. Pt verbalized understanding and agrees with this plan.

## 2017-06-08 NOTE — Telephone Encounter (Signed)
Melissa Novak is calling because she has been out town was told to call back and give an update on her condition

## 2017-06-11 NOTE — Telephone Encounter (Signed)
Schedule for a biv PPM upgrade. If she is unsure and wants to come back in to discuss that would be fine.  GT

## 2017-06-12 ENCOUNTER — Telehealth: Payer: Self-pay

## 2017-06-12 NOTE — Telephone Encounter (Signed)
Call placed to Pt.  Requested clarification-does she want to schedule Bi-V upgrade or schedule appt with Dr. Lovena Le first? Per Pt, she wants to schedule an appt first.  Spoke with Melissa, Pt to see Dr. Lovena Le 06/22/2017 @ 1615 to discuss upgrade. Pt states she continues to be sob.

## 2017-06-12 NOTE — Telephone Encounter (Signed)
Error

## 2017-06-12 NOTE — Telephone Encounter (Signed)
Melissa Novak is working on this.

## 2017-06-20 ENCOUNTER — Telehealth: Payer: Self-pay | Admitting: Internal Medicine

## 2017-06-20 NOTE — Telephone Encounter (Signed)
Melissa Novak will have her ppm checked in office while she is here to see Dr. Lovena Le. She is aware.

## 2017-06-20 NOTE — Telephone Encounter (Signed)
New Message     Pt wants you to do her heart transmission why she is in the office.  Please call

## 2017-06-21 DIAGNOSIS — E119 Type 2 diabetes mellitus without complications: Secondary | ICD-10-CM | POA: Diagnosis not present

## 2017-06-21 DIAGNOSIS — Z7984 Long term (current) use of oral hypoglycemic drugs: Secondary | ICD-10-CM | POA: Diagnosis not present

## 2017-06-22 ENCOUNTER — Encounter (INDEPENDENT_AMBULATORY_CARE_PROVIDER_SITE_OTHER): Payer: Self-pay

## 2017-06-22 ENCOUNTER — Encounter: Payer: Self-pay | Admitting: Internal Medicine

## 2017-06-22 ENCOUNTER — Ambulatory Visit (INDEPENDENT_AMBULATORY_CARE_PROVIDER_SITE_OTHER): Payer: Medicare Other | Admitting: Internal Medicine

## 2017-06-22 VITALS — BP 136/76 | HR 93 | Ht 62.0 in | Wt 218.6 lb

## 2017-06-22 DIAGNOSIS — I442 Atrioventricular block, complete: Secondary | ICD-10-CM | POA: Diagnosis not present

## 2017-06-22 DIAGNOSIS — I5022 Chronic systolic (congestive) heart failure: Secondary | ICD-10-CM

## 2017-06-22 NOTE — Progress Notes (Signed)
    HPI Melissa Novak is referred today for ongoing consideration of upgrade to a BiV PPM from a DDD PM. She is an elderly obese woman with CHB who underwent DDD PM insertion years ago. She has developed worsening CHF symptoms and and EF of 35%, down from normal. I have treated her with additional diuretic therapy. She is improved. However, she has continued sob. She has inquired about upgrade to a BiV PPM and we have discussed this. She has pacing induced QRS duration of 160 ms with LBBB. Allergies  Allergen Reactions  . Sulfa Antibiotics Anaphylaxis  . Percocet [Oxycodone-Acetaminophen] Rash  . Tramadol Hives, Nausea Only and Other (See Comments)    Blurred vision  . Benadryl [Diphenhydramine Hcl] Rash and Other (See Comments)    BLURRED VISION  . Cephalexin Rash  . Codeine Rash  . Demerol Nausea Only and Rash  . Dilaudid [Hydromorphone Hcl] Nausea And Vomiting  . Dimenhydrinate Rash and Other (See Comments)    BLURRED VISION  . Meclizine Hcl Rash and Other (See Comments)    BLURRED VISION  . Penicillins Rash and Other (See Comments)    FEVER  . Pentazocine Lactate Rash and Other (See Comments)    FEVER     Current Outpatient Prescriptions  Medication Sig Dispense Refill  . amLODipine (NORVASC) 2.5 MG tablet Take 2.5 mg by mouth daily.    . Ascorbic Acid (VITAMIN C) 1000 MG tablet Take 1,000 mg by mouth daily.    . aspirin 81 MG tablet Take 81 mg by mouth daily.    . atorvastatin (LIPITOR) 10 MG tablet Take 1 tablet by mouth daily.    . calcium carbonate (OS-CAL) 600 MG TABS tablet Take 600 mg by mouth daily.     . Cholecalciferol (VITAMIN D PO) Take 1 tablet by mouth daily.    . FEMRING 0.1 MG/24HR RING Place 1 each vaginally See admin instructions. Change every 90 days    . furosemide (LASIX) 40 MG tablet Take 1.5 tablets (60 mg total) by mouth daily. 45 tablet 6  . GARLIC PO Take 1 tablet by mouth daily.     . HYDROcodone-acetaminophen (NORCO) 5-325 MG tablet Take 1-2  tablets by mouth every 6 (six) hours as needed for moderate pain or severe pain. 30 tablet 0  . losartan (COZAAR) 50 MG tablet Take 50 mg by mouth 2 (two) times daily.    . metFORMIN (GLUCOPHAGE-XR) 500 MG 24 hr tablet Take 500 mg by mouth daily with supper.  3  . Misc Natural Products (OSTEO BI-FLEX JOINT SHIELD PO) Take 1 tablet by mouth daily.     . Multiple Vitamin (MULTIVITAMIN WITH MINERALS) TABS Take 1 tablet by mouth daily.    . Multiple Vitamins-Minerals (ZINC PO) Take 1 tablet by mouth daily.     . MYRBETRIQ 50 MG TB24 tablet Take 50 mg by mouth daily.  11  . Saccharomyces boulardii (FLORASTOR PO) Take 1 capsule by mouth daily.    . valACYclovir (VALTREX) 1000 MG tablet Take 1,000 mg by mouth daily.     No current facility-administered medications for this visit.      Past Medical History:  Diagnosis Date  . Cancer ([facility])    melanoma right forearm  . Complete heart block ([facility])   . Coronary artery disease CARDIOLOGIST- DR SKAIN-  LAST VISIT THIS WEEK--  WILL REQUEST NOTE, STRESS TEST    PT DENIES S & S  . Cough    pt states has   had cough  5-7 weeks. pcp aware  . Diverticulosis   . History of pulmonary embolism 1970'S  . History of shingles 2004 --  EYES  , NO RESIDUAL PAIN  . Hypertension   . LBBB (left bundle branch block)   . Malignant melanoma of right forearm ([facility]) 03/17/2016  . Pacemaker 4/13   DDD St Jude  . PONV (postoperative nausea and vomiting)    pt states no n/v w 4/13 surgery  . Prolapse of anterior vaginal wall     ROS:   All systems reviewed and negative except as noted in the HPI.   Past Surgical History:  Procedure Laterality Date  . BREAST SURGERY     biopsy  . CARDIOVASCULAR STRESS TEST  02-14-2012  DR SKAINS   LOW RISK NUCLEAR TEST/ MILD FIXED DEFECT ALONG THE DISTAL ANTERIOR SEPTAL WALL CONSTISTENT WITH LBBB/ NO ISCHEMIA   . CATARACT EXTRACTION W/ INTRAOCULAR LENS  IMPLANT, BILATERAL    . CHOLECYSTECTOMY  1967  . INSERT / REPLACE / REMOVE  PACEMAKER  4/13   ddd St Jude pacer,d/t symptomatic bradycardia w intermittent complete heart block.  Inserted By Dr. Greg Kandiss Ihrig 4/13  . MELANOMA EXCISION Right 03/17/2016   Procedure: WIDE EXCISION MELANOMA RIGHT FOREARM;  Surgeon: Haywood Ingram, MD;  Location: [facility] OR;  Service: General;  Laterality: Right;  . PERMANENT PACEMAKER INSERTION N/A 02/27/2012   Procedure: PERMANENT PACEMAKER INSERTION;  Surgeon: Callahan Peddie W Nieves Barberi, MD;  Location: [facility] CATH [facility];  Service: Cardiovascular;  Laterality: N/A;  . 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 PUBOVAGINAL SLING  12/07/2012   Procedure: PUBO-VAGINAL SLING;  Surgeon: Sigmund I Tannenbaum, MD;  Location: [facility];  Service: Urology;  Laterality: N/A;  solyx sling   . TRANSTHORACIC ECHOCARDIOGRAM  02-15-2012  DR SKAINS   NORMAL EF 60-65%/ MILD AORTIC SCLEROSIS, NO STENOSIS, GRADE I DIASTOLIC DYSFUNCTION  . VAGINAL HYSTERECTOMY  1974   STILL HAS OVARIES     Family History  Problem Relation Age of Onset  . Cancer Mother   . Cancer Father   . Kidney disease Son   . Cancer Brother   . Kidney disease Brother      Social History   Social History  . Marital status: Widowed    Spouse name: N/A  . Number of children: N/A  . Years of education: N/A   Occupational History  . Not on file.   Social History Main Topics  . Smoking status: Former Smoker  . Smokeless tobacco: Never Used     Comment: have not smoked in 50 years 03/16/16  . Alcohol use Yes     Comment: RARE  . Drug use: No  . Sexual activity: Not on file   Other Topics Concern  . Not on file   Social History Narrative  . No narrative on file     BP 136/76   Pulse 93   Ht 5' 2" (1.575 m)   Wt 218 lb 9.6 oz (99.2 kg)   SpO2 94%   BMI 39.98 kg/m   Physical  Exam:  Stable obese appearing NAD HEENT: Unremarkable Neck:  6 cm JVD, no thyromegally Lymphatics:  No adenopathy Back:  No CVA tenderness Lungs:  Clear except for basilar rales, well healed PPM incision. HEART:  Regular rate rhythm,   no murmurs, no rubs, no clicks Abd:  soft, positive bowel sounds, no organomegally, no rebound, no guarding Ext:  2 plus pulses, no edema, no cyanosis, no clubbing Skin:  No rashes no nodules Neuro:  CN II through XII intact, motor grossly intact  EKG - reviewed NSR with pacing induced LBBB   DEVICE  Normal device function.  See PaceArt for details.   Assess/Plan: 1. Chronic systolic heart failure - her EF is 35% and her QRS is LBBB with 160 ms duration. I have discussed the indications of PPM upgrade to a Biv PPM. The risks/benefits/goals/expectations of the procedure were reviewed  With the patient and her family. She wishes to proceed. 2. PPM - review of her St. Jude DDD PM demonstrates normal function as of May.  3. Obesity - we discussed the importance of weight loss.  4. HTN - her blood pressure today is minimally elevated. She is encouraged to lose weight and maintain a low sodium diet.  Ines Warf,M.D. 

## 2017-06-22 NOTE — Patient Instructions (Addendum)
Medication Instructions:  Your physician recommends that you continue on your current medications as directed. Please refer to the Current Medication list given to you today.   Labwork: You need to return to [facility] office July 14, 2017 any time for CBC and BMET. You do not need to be fasting.   Testing/Procedures: None ordered.   Follow up:  Your physician has recommended that you have a pacemaker inserted. A pacemaker is a small device that is placed under the skin of your chest or abdomen to help control abnormal heart rhythms. This device uses electrical pulses to prompt the heart to beat at a normal rate. Pacemakers are used to treat heart rhythms that are too slow. Wire (leads) are attached to the pacemaker that goes into the chambers of you heart. This is done in the hospital and usually requires and overnight stay. Please see the instruction sheet given to you today for more information.  You have been scheduled for a [employer]. Jude upgrade on July 17, 2017 @ 1130 am.  Please arrive to the [facility] at 9:30 am to get checked in at admissions.  You will need a follow up appointment 10-14 days with the device clinic for a wound check.  You will follow up with Dr. Lovena Le 91 days after the procedure.    Any Other Special Instructions Will Be Listed Below (If Applicable).  Please arrive at the [facility] main entrance of [facility] hospital at:  9:30 am. Do not eat or drink after midnight prior to procedure Do not take any medications the morning of the procedure Plan for one night stay You will need someone to drive you home at discharge Use the surgical scrub as directed     If you need a refill on your cardiac medications before your next appointment, please call your pharmacy.

## 2017-06-23 ENCOUNTER — Telehealth: Payer: Self-pay | Admitting: Internal Medicine

## 2017-06-23 NOTE — Telephone Encounter (Signed)
Spoke with pt and informed her that Dr. Lovena Le would place the new LV lead first and she would be on a temporary pacing analyzer while he changed out the generator. Pt stated that she reacted to an anesthesia of some sort before but could not recall the name, informed pt to inform the pre-op nurse of this prior to procedure. Pt voiced understanding.

## 2017-06-23 NOTE — Telephone Encounter (Signed)
Pt calling to ask that if she is totally dependent-and she would like to verify this-on her Pacemaker, what happens during upgrade between when the old one is taken out and the new one is being put in?

## 2017-06-26 ENCOUNTER — Telehealth: Payer: Self-pay | Admitting: Internal Medicine

## 2017-06-26 NOTE — Telephone Encounter (Signed)
Need to discuss upcoming surgery for replacement of pacer.  I have several questions.

## 2017-06-26 NOTE — Telephone Encounter (Signed)
Pt called with questions r/t to her 8/27 procedure. Pt worried as she is diabetic that she will not have food until after her procedure with is scheduled at 11:30am with 9:30am arrival. Pt educated to have a good snack before bed (PB crackers, fruit, yogurt examples given) and her blood glucose will be checked on arrival to pre-op at hospital.  Pt also wanted to verify her scheduled f/u appointment with device clinic and Dr. Lovena Le.  Pt verbalized understanding, no additional questions at this time.

## 2017-07-04 ENCOUNTER — Telehealth: Payer: Self-pay | Admitting: Internal Medicine

## 2017-07-04 NOTE — Telephone Encounter (Signed)
Pt calling regarding upgrade for 07-17-17 with [facility]. She has an 1130a, case and has to be there at 930a. She is a diabetic and doesn't think she can be NPO after midnight. Asking to do earlier in the am like 730am so she doesn't have to go so long without eating.  pls call home number [phone] before 11a or after 1p .

## 2017-07-04 NOTE — Telephone Encounter (Signed)
Call rcvd from Pt.  Pt concerned about being NPO from midnight until her procedure @ 11:30 am.   Call placed to Pt.  Pt asking if Dr. Lovena Le is a "strict" NPO doctor.  If so she needs to change her appt to an earlier time.  Discussed possible resolutions with Pt, agreed to wait for Dr. Lovena Le to return from vacation and see what he says.  Will call Pt next week after speaking with Dr. Lovena Le.

## 2017-07-11 NOTE — Telephone Encounter (Signed)
F/U   Patient calling, states that she has a question about her procedure in regards to her being diabetic.

## 2017-07-11 NOTE — Telephone Encounter (Signed)
Call placed to Pt.  Notified Dr. Lovena Le states ok to have light breakfast before 6 am   Nothing by mouth after.  Pt indicates understanding.

## 2017-07-12 ENCOUNTER — Ambulatory Visit: Payer: Medicare Other | Admitting: *Deleted

## 2017-07-14 ENCOUNTER — Other Ambulatory Visit: Payer: Medicare Other

## 2017-07-14 DIAGNOSIS — I5022 Chronic systolic (congestive) heart failure: Secondary | ICD-10-CM | POA: Diagnosis not present

## 2017-07-14 DIAGNOSIS — I442 Atrioventricular block, complete: Secondary | ICD-10-CM

## 2017-07-14 LAB — CBC WITH DIFFERENTIAL/PLATELET
BASOS ABS: 0.1 10*3/uL (ref 0.0–0.2)
BASOS: 1 %
EOS (ABSOLUTE): 0.5 10*3/uL — ABNORMAL HIGH (ref 0.0–0.4)
Eos: 6 %
HEMATOCRIT: 40.6 % (ref 34.0–46.6)
HEMOGLOBIN: 14 g/dL (ref 11.1–15.9)
IMMATURE GRANS (ABS): 0 10*3/uL (ref 0.0–0.1)
Immature Granulocytes: 0 %
LYMPHS ABS: 1.8 10*3/uL (ref 0.7–3.1)
LYMPHS: 23 %
MCH: 32.9 pg (ref 26.6–33.0)
MCHC: 34.5 g/dL (ref 31.5–35.7)
MCV: 96 fL (ref 79–97)
MONOCYTES: 10 %
Monocytes Absolute: 0.8 10*3/uL (ref 0.1–0.9)
NEUTROS ABS: 4.8 10*3/uL (ref 1.4–7.0)
Neutrophils: 60 %
Platelets: 299 10*3/uL (ref 150–379)
RBC: 4.25 x10E6/uL (ref 3.77–5.28)
RDW: 13.3 % (ref 12.3–15.4)
WBC: 8 10*3/uL (ref 3.4–10.8)

## 2017-07-14 LAB — BASIC METABOLIC PANEL
BUN/Creatinine Ratio: 28 (ref 12–28)
BUN: 25 mg/dL (ref 8–27)
CALCIUM: 9.8 mg/dL (ref 8.7–10.3)
CHLORIDE: 102 mmol/L (ref 96–106)
CO2: 23 mmol/L (ref 20–29)
Creatinine, Ser: 0.89 mg/dL (ref 0.57–1.00)
GFR calc non Af Amer: 61 mL/min/{1.73_m2} (ref >59)
GFR, EST AFRICAN AMERICAN: 70 mL/min/{1.73_m2} (ref >59)
Glucose: 198 mg/dL — ABNORMAL HIGH (ref 65–99)
Potassium: 4.3 mmol/L (ref 3.5–5.2)
Sodium: 140 mmol/L (ref 134–144)

## 2017-07-17 ENCOUNTER — Ambulatory Visit (HOSPITAL_COMMUNITY)
Admission: RE | Disposition: A | Discharge status: Home / Self Care | Payer: Self-pay | Source: Ambulatory Visit | Attending: Internal Medicine

## 2017-07-17 ENCOUNTER — Encounter (HOSPITAL_COMMUNITY): Payer: Self-pay | Admitting: Internal Medicine

## 2017-07-17 ENCOUNTER — Ambulatory Visit (HOSPITAL_COMMUNITY)
Admission: RE | Admit: 2017-07-17 | Discharge: 2017-07-18 | Disposition: A | Discharge status: Home / Self Care | Payer: Medicare Other | Source: Ambulatory Visit | Attending: Internal Medicine | Admitting: Internal Medicine

## 2017-07-17 DIAGNOSIS — I110 Hypertensive heart disease with heart failure: Secondary | ICD-10-CM | POA: Insufficient documentation

## 2017-07-17 DIAGNOSIS — I5022 Chronic systolic (congestive) heart failure: Secondary | ICD-10-CM | POA: Insufficient documentation

## 2017-07-17 DIAGNOSIS — I447 Left bundle-branch block, unspecified: Secondary | ICD-10-CM | POA: Diagnosis not present

## 2017-07-17 DIAGNOSIS — Z7982 Long term (current) use of aspirin: Secondary | ICD-10-CM | POA: Insufficient documentation

## 2017-07-17 DIAGNOSIS — Z45018 Encounter for adjustment and management of other part of cardiac pacemaker: Secondary | ICD-10-CM | POA: Diagnosis not present

## 2017-07-17 DIAGNOSIS — Z86711 Personal history of pulmonary embolism: Secondary | ICD-10-CM | POA: Insufficient documentation

## 2017-07-17 DIAGNOSIS — Z87891 Personal history of nicotine dependence: Secondary | ICD-10-CM | POA: Insufficient documentation

## 2017-07-17 DIAGNOSIS — Z95810 Presence of automatic (implantable) cardiac defibrillator: Secondary | ICD-10-CM

## 2017-07-17 DIAGNOSIS — I442 Atrioventricular block, complete: Secondary | ICD-10-CM | POA: Insufficient documentation

## 2017-07-17 DIAGNOSIS — E669 Obesity, unspecified: Secondary | ICD-10-CM | POA: Diagnosis not present

## 2017-07-17 DIAGNOSIS — Z79899 Other long term (current) drug therapy: Secondary | ICD-10-CM | POA: Insufficient documentation

## 2017-07-17 DIAGNOSIS — Z950 Presence of cardiac pacemaker: Secondary | ICD-10-CM | POA: Insufficient documentation

## 2017-07-17 DIAGNOSIS — Z85820 Personal history of malignant melanoma of skin: Secondary | ICD-10-CM | POA: Insufficient documentation

## 2017-07-17 DIAGNOSIS — Z882 Allergy status to sulfonamides status: Secondary | ICD-10-CM | POA: Diagnosis not present

## 2017-07-17 DIAGNOSIS — Z880 Allergy status to penicillin: Secondary | ICD-10-CM | POA: Insufficient documentation

## 2017-07-17 DIAGNOSIS — I2510 Atherosclerotic heart disease of native coronary artery without angina pectoris: Secondary | ICD-10-CM | POA: Diagnosis not present

## 2017-07-17 DIAGNOSIS — Z7984 Long term (current) use of oral hypoglycemic drugs: Secondary | ICD-10-CM | POA: Diagnosis not present

## 2017-07-17 DIAGNOSIS — I509 Heart failure, unspecified: Secondary | ICD-10-CM | POA: Diagnosis not present

## 2017-07-17 HISTORY — PX: BIV UPGRADE: EP1202

## 2017-07-17 LAB — GLUCOSE, CAPILLARY
GLUCOSE-CAPILLARY: 103 mg/dL — AB (ref 65–99)
GLUCOSE-CAPILLARY: 144 mg/dL — AB (ref 65–99)
GLUCOSE-CAPILLARY: 159 mg/dL — AB (ref 65–99)
Glucose-Capillary: 161 mg/dL — ABNORMAL HIGH (ref 65–99)

## 2017-07-17 LAB — SURGICAL PCR SCREEN
MRSA, PCR: NEGATIVE
Staphylococcus aureus: NEGATIVE

## 2017-07-17 SURGERY — BIV UPGRADE
Anesthesia: LOCAL

## 2017-07-17 MED ORDER — VANCOMYCIN HCL IN DEXTROSE 1-5 GM/200ML-% IV SOLN
1000.0000 mg | INTRAVENOUS | Status: AC
  Administered 2017-07-17: 1000 mg via INTRAVENOUS

## 2017-07-17 MED ORDER — LIDOCAINE HCL (PF) 1 % IJ SOLN
INTRAMUSCULAR | Status: AC
  Filled 2017-07-17: qty 60

## 2017-07-17 MED ORDER — VANCOMYCIN HCL IN DEXTROSE 1-5 GM/200ML-% IV SOLN
INTRAVENOUS | Status: AC
  Filled 2017-07-17: qty 200

## 2017-07-17 MED ORDER — CHLORHEXIDINE GLUCONATE 4 % EX LIQD: 60.0000 mL | Freq: Once | CUTANEOUS | Status: DC

## 2017-07-17 MED ORDER — ASPIRIN 81 MG PO CHEW
81.0000 mg | CHEWABLE_TABLET | Freq: Every evening | ORAL | Status: DC
  Administered 2017-07-17: 81 mg via ORAL
  Filled 2017-07-17: qty 1

## 2017-07-17 MED ORDER — SODIUM CHLORIDE 0.9 % IV SOLN: INTRAVENOUS | Status: DC

## 2017-07-17 MED ORDER — INSULIN ASPART 100 UNIT/ML ~~LOC~~ SOLN: 0.0000 [IU] | Freq: Three times a day (TID) | SUBCUTANEOUS | Status: DC

## 2017-07-17 MED ORDER — MIDAZOLAM HCL 5 MG/5ML IJ SOLN
INTRAMUSCULAR | Status: AC
  Filled 2017-07-17: qty 5

## 2017-07-17 MED ORDER — MIDAZOLAM HCL 5 MG/5ML IJ SOLN
INTRAMUSCULAR | Status: DC | PRN
  Administered 2017-07-17 (×7): 1 mg via INTRAVENOUS

## 2017-07-17 MED ORDER — IOPAMIDOL (ISOVUE-370) INJECTION 76%
INTRAVENOUS | Status: AC
  Filled 2017-07-17: qty 100

## 2017-07-17 MED ORDER — HEPARIN (PORCINE) IN NACL 2-0.9 UNIT/ML-% IJ SOLN
INTRAMUSCULAR | Status: AC | PRN
  Administered 2017-07-17: 500 mL

## 2017-07-17 MED ORDER — MUPIROCIN 2 % EX OINT
TOPICAL_OINTMENT | CUTANEOUS | Status: AC
  Administered 2017-07-17: 1 via TOPICAL
  Filled 2017-07-17: qty 22

## 2017-07-17 MED ORDER — FUROSEMIDE 20 MG PO TABS: 20.0000 mg | ORAL_TABLET | Freq: Every day | ORAL | Status: DC

## 2017-07-17 MED ORDER — INSULIN ASPART 100 UNIT/ML ~~LOC~~ SOLN: 0.0000 [IU] | Freq: Every day | SUBCUTANEOUS | Status: DC

## 2017-07-17 MED ORDER — ATORVASTATIN CALCIUM 10 MG PO TABS
10.0000 mg | ORAL_TABLET | Freq: Every day | ORAL | Status: DC
  Administered 2017-07-17 – 2017-07-18 (×2): 10 mg via ORAL
  Filled 2017-07-17 (×2): qty 1

## 2017-07-17 MED ORDER — IOPAMIDOL (ISOVUE-370) INJECTION 76%
INTRAVENOUS | Status: DC | PRN
  Administered 2017-07-17 (×2): 10 mL via INTRAVENOUS

## 2017-07-17 MED ORDER — LOSARTAN POTASSIUM 50 MG PO TABS
50.0000 mg | ORAL_TABLET | Freq: Two times a day (BID) | ORAL | Status: DC
  Administered 2017-07-17 – 2017-07-18 (×2): 50 mg via ORAL
  Filled 2017-07-17 (×2): qty 1

## 2017-07-17 MED ORDER — FUROSEMIDE 20 MG PO TABS
60.0000 mg | ORAL_TABLET | Freq: Every day | ORAL | Status: DC
  Filled 2017-07-17 (×2): qty 1

## 2017-07-17 MED ORDER — SODIUM CHLORIDE 0.9 % IR SOLN
80.0000 mg | Status: AC
  Administered 2017-07-17: 80 mg

## 2017-07-17 MED ORDER — SODIUM CHLORIDE 0.9 % IR SOLN
Status: AC
  Filled 2017-07-17: qty 2

## 2017-07-17 MED ORDER — FENTANYL CITRATE (PF) 100 MCG/2ML IJ SOLN
INTRAMUSCULAR | Status: AC
  Filled 2017-07-17: qty 2

## 2017-07-17 MED ORDER — FENTANYL CITRATE (PF) 100 MCG/2ML IJ SOLN
INTRAMUSCULAR | Status: DC | PRN
  Administered 2017-07-17 (×7): 12.5 ug via INTRAVENOUS

## 2017-07-17 MED ORDER — AMLODIPINE BESYLATE 2.5 MG PO TABS
2.5000 mg | ORAL_TABLET | Freq: Every day | ORAL | Status: DC
  Administered 2017-07-17 – 2017-07-18 (×2): 2.5 mg via ORAL
  Filled 2017-07-17 (×2): qty 1

## 2017-07-17 MED ORDER — LIDOCAINE HCL (PF) 1 % IJ SOLN
INTRAMUSCULAR | Status: DC | PRN
  Administered 2017-07-17: 45 mL via INTRADERMAL

## 2017-07-17 MED ORDER — CALCIUM CARBONATE 1250 (500 CA) MG PO TABS
1250.0000 mg | ORAL_TABLET | Freq: Every day | ORAL | Status: DC
  Administered 2017-07-18: 1250 mg via ORAL
  Filled 2017-07-17: qty 1

## 2017-07-17 MED ORDER — MUPIROCIN 2 % EX OINT
1.0000 "application " | TOPICAL_OINTMENT | Freq: Once | CUTANEOUS | Status: AC
  Administered 2017-07-17: 1 via TOPICAL

## 2017-07-17 MED ORDER — HYDROCODONE-ACETAMINOPHEN 5-325 MG PO TABS: 1.0000 | ORAL_TABLET | Freq: Four times a day (QID) | ORAL | Status: DC | PRN

## 2017-07-17 MED ORDER — VANCOMYCIN HCL IN DEXTROSE 1-5 GM/200ML-% IV SOLN
1000.0000 mg | Freq: Two times a day (BID) | INTRAVENOUS | Status: AC
  Administered 2017-07-17: 1000 mg via INTRAVENOUS
  Filled 2017-07-17: qty 200

## 2017-07-17 MED ORDER — ONDANSETRON HCL 4 MG/2ML IJ SOLN: 4.0000 mg | Freq: Four times a day (QID) | INTRAMUSCULAR | Status: DC | PRN

## 2017-07-17 SURGICAL SUPPLY — 16 items
ALLURE CRT PM3262 (Pacemaker) ×2 IMPLANT
CABLE SURGICAL S-101-97-12 (CABLE) ×2 IMPLANT
CATH CPS DIRECT 135 DS2C020 (CATHETERS) ×2 IMPLANT
CATH CPS QUART SUB DS2N027-59 (CATHETERS) ×2 IMPLANT
CATH HEX JOSEPH 2-5-2 65CM 6F (CATHETERS) ×2 IMPLANT
CPS IMPLANT KIT 410190 (MISCELLANEOUS) ×2 IMPLANT
KIT ESSENTIALS PG (KITS) ×2 IMPLANT
LEAD QUARTET 1456Q-86 (Lead) ×1 IMPLANT
PACEMAKER ALLURE CRT (Pacemaker) ×1 IMPLANT
PAD DEFIB LIFELINK (PAD) ×2 IMPLANT
QUARTET 1456Q-86 (Lead) ×2 IMPLANT
SLITTER UNIVERSAL DS2A003 (MISCELLANEOUS) ×2 IMPLANT
TRAY PACEMAKER INSERTION (PACKS) ×2 IMPLANT
WIRE ACUITY WHISPER EDS 4648 (WIRE) ×4 IMPLANT
WIRE LUGE 182CM (WIRE) ×2 IMPLANT
WIRE MAILMAN 182CM (WIRE) ×2 IMPLANT

## 2017-07-17 NOTE — Progress Notes (Signed)
Only 1 IV needed per Dr Lovena Le

## 2017-07-17 NOTE — Interval H&P Note (Signed)
History and Physical Interval Note:  07/17/2017 10:06 AM  Melissa Novak  has presented today for surgery, with the diagnosis of chf  The various methods of treatment have been discussed with the patient and family. After consideration of risks, benefits and other options for treatment, the patient has consented to  Procedure(s): BiV Upgrade (N/A) as a surgical intervention .  The patient's history has been reviewed, patient examined, no change in status, stable for surgery.  I have reviewed the patient's chart and labs.  Questions were answered to the patient's satisfaction.     Cristopher Peru

## 2017-07-17 NOTE — H&P (View-Only) (Signed)
HPI Melissa Novak is referred today for ongoing consideration of upgrade to a BiV PPM from a DDD PM. She is an elderly obese woman with CHB who underwent DDD PM insertion years ago. She has developed worsening CHF symptoms and and EF of 35%, down from normal. I have treated her with additional diuretic therapy. She is improved. However, she has continued sob. She has inquired about upgrade to a BiV PPM and we have discussed this. She has pacing induced QRS duration of 160 ms with LBBB. Allergies  Allergen Reactions  . Sulfa Antibiotics Anaphylaxis  . Percocet [Oxycodone-Acetaminophen] Rash  . Tramadol Hives, Nausea Only and Other (See Comments)    Blurred vision  . Benadryl [Diphenhydramine Hcl] Rash and Other (See Comments)    BLURRED VISION  . Cephalexin Rash  . Codeine Rash  . Demerol Nausea Only and Rash  . Dilaudid [Hydromorphone Hcl] Nausea And Vomiting  . Dimenhydrinate Rash and Other (See Comments)    BLURRED VISION  . Meclizine Hcl Rash and Other (See Comments)    BLURRED VISION  . Penicillins Rash and Other (See Comments)    FEVER  . Pentazocine Lactate Rash and Other (See Comments)    FEVER     Current Outpatient Prescriptions  Medication Sig Dispense Refill  . amLODipine (NORVASC) 2.5 MG tablet Take 2.5 mg by mouth daily.    . Ascorbic Acid (VITAMIN C) 1000 MG tablet Take 1,000 mg by mouth daily.    Marland Kitchen aspirin 81 MG tablet Take 81 mg by mouth daily.    Marland Kitchen atorvastatin (LIPITOR) 10 MG tablet Take 1 tablet by mouth daily.    . calcium carbonate (OS-CAL) 600 MG TABS tablet Take 600 mg by mouth daily.     . Cholecalciferol (VITAMIN D PO) Take 1 tablet by mouth daily.    Marland Kitchen FEMRING 0.1 MG/24HR RING Place 1 each vaginally See admin instructions. Change every 90 days    . furosemide (LASIX) 40 MG tablet Take 1.5 tablets (60 mg total) by mouth daily. 45 tablet 6  . GARLIC PO Take 1 tablet by mouth daily.     Marland Kitchen HYDROcodone-acetaminophen (NORCO) 5-325 MG tablet Take 1-2  tablets by mouth every 6 (six) hours as needed for moderate pain or severe pain. 30 tablet 0  . losartan (COZAAR) 50 MG tablet Take 50 mg by mouth 2 (two) times daily.    . metFORMIN (GLUCOPHAGE-XR) 500 MG 24 hr tablet Take 500 mg by mouth daily with supper.  3  . Misc Natural Products (OSTEO BI-FLEX JOINT SHIELD PO) Take 1 tablet by mouth daily.     . Multiple Vitamin (MULTIVITAMIN WITH MINERALS) TABS Take 1 tablet by mouth daily.    . Multiple Vitamins-Minerals (ZINC PO) Take 1 tablet by mouth daily.     Marland Kitchen MYRBETRIQ 50 MG TB24 tablet Take 50 mg by mouth daily.  11  . Saccharomyces boulardii (FLORASTOR PO) Take 1 capsule by mouth daily.    . valACYclovir (VALTREX) 1000 MG tablet Take 1,000 mg by mouth daily.     No current facility-administered medications for this visit.      Past Medical History:  Diagnosis Date  . Cancer ([place])    melanoma right forearm  . Complete heart block ([place])   . Coronary artery disease CARDIOLOGIST- DR Gillian Shields-  LAST VISIT THIS WEEK--  WILL REQUEST NOTE, STRESS TEST    PT DENIES S & S  . Cough    pt states has  had cough  5-7 weeks. pcp aware  . Diverticulosis   . History of pulmonary embolism 1970'S  . History of shingles 2004 --  EYES  , NO RESIDUAL PAIN  . Hypertension   . LBBB (left bundle branch block)   . Malignant melanoma of right forearm ([place]) 03/17/2016  . Pacemaker 4/13   DDD St Jude  . PONV (postoperative nausea and vomiting)    pt states no n/v w 4/13 surgery  . Prolapse of anterior vaginal wall     ROS:   All systems reviewed and negative except as noted in the HPI.   Past Surgical History:  Procedure Laterality Date  . BREAST SURGERY     biopsy  . CARDIOVASCULAR STRESS TEST  02-14-2012  DR SKAINS   LOW RISK NUCLEAR TEST/ MILD FIXED DEFECT ALONG THE DISTAL ANTERIOR SEPTAL WALL CONSTISTENT WITH LBBB/ NO ISCHEMIA   . CATARACT EXTRACTION W/ INTRAOCULAR LENS  IMPLANT, BILATERAL    . CHOLECYSTECTOMY  1967  . INSERT / REPLACE / REMOVE  PACEMAKER  4/13   ddd St Jude pacer,d/t symptomatic bradycardia w intermittent complete heart block.  Inserted By Dr. Crissie Sickles 4/13  . MELANOMA EXCISION Right 03/17/2016   Procedure: WIDE EXCISION MELANOMA RIGHT FOREARM;  Surgeon: Fanny Skates, MD;  Location: [facility];  Service: General;  Laterality: Right;  . PERMANENT PACEMAKER INSERTION N/A 02/27/2012   Procedure: PERMANENT PACEMAKER INSERTION;  Surgeon: Evans Lance, MD;  Location: [facility] CATH LAB;  Service: Cardiovascular;  Laterality: N/A;  . 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 PUBOVAGINAL SLING  12/07/2012   Procedure: Gaynelle Arabian;  Surgeon: Ailene Rud, MD;  Location: [facility];  Service: Urology;  Laterality: N/A;  solyx sling   . TRANSTHORACIC ECHOCARDIOGRAM  02-15-2012  DR SKAINS   NORMAL EF 60-65%/ MILD AORTIC SCLEROSIS, NO STENOSIS, GRADE I DIASTOLIC DYSFUNCTION  . VAGINAL HYSTERECTOMY  1974   STILL HAS OVARIES     Family History  Problem Relation Age of Onset  . Cancer Mother   . Cancer Father   . Kidney disease Son   . Cancer Brother   . Kidney disease Brother      Social History   Social History  . Marital status: Widowed    Spouse name: N/A  . Number of children: N/A  . Years of education: N/A   Occupational History  . Not on file.   Social History Main Topics  . Smoking status: Former Research scientist (life sciences)  . Smokeless tobacco: Never Used     Comment: have not smoked in 50 years 03/16/16  . Alcohol use Yes     Comment: RARE  . Drug use: No  . Sexual activity: Not on file   Other Topics Concern  . Not on file   Social History Narrative  . No narrative on file     BP 136/76   Pulse 93   Ht 5\' 2"  (1.575 m)   Wt 218 lb 9.6 oz (99.2 kg)   SpO2 94%   BMI 39.98 kg/m   Physical  Exam:  Stable obese appearing NAD HEENT: Unremarkable Neck:  6 cm JVD, no thyromegally Lymphatics:  No adenopathy Back:  No CVA tenderness Lungs:  Clear except for basilar rales, well healed PPM incision. HEART:  Regular rate rhythm,  no murmurs, no rubs, no clicks Abd:  soft, positive bowel sounds, no organomegally, no rebound, no guarding Ext:  2 plus pulses, no edema, no cyanosis, no clubbing Skin:  No rashes no nodules Neuro:  CN II through XII intact, motor grossly intact  EKG - reviewed NSR with pacing induced LBBB   DEVICE  Normal device function.  See PaceArt for details.   Assess/Plan: 1. Chronic systolic heart failure - her EF is 35% and her QRS is LBBB with 160 ms duration. I have discussed the indications of PPM upgrade to a Biv PPM. The risks/benefits/goals/expectations of the procedure were reviewed  With the patient and her family. She wishes to proceed. 2. PPM - review of her St. Jude DDD PM demonstrates normal function as of May.  3. Obesity - we discussed the importance of weight loss.  4. HTN - her blood pressure today is minimally elevated. She is encouraged to lose weight and maintain a low sodium diet.  Mikle Bosworth.D.

## 2017-07-17 NOTE — Progress Notes (Signed)
Page sent to Carlye Grippe with EP to request insulin/cbg orders. Orders to be placed.

## 2017-07-17 NOTE — Discharge Instructions (Signed)
° ° °  Supplemental Discharge Instructions for  Pacemaker/Defibrillator Patients  Activity No heavy lifting or vigorous activity with your left/right arm for 6 to 8 weeks.  Do not raise your left/right arm above your head for one week.  Gradually raise your affected arm as drawn below.             07/21/17                     07/22/17                      07/23/17                     07/24/17 __  NO DRIVING for  1 week    ; you may begin driving on   05/22/08  .  WOUND CARE - Keep the wound area clean and dry.  Do not get this area wet for one week. No showers for one week; you may shower on   07/24/17  . - The tape/steri-strips on your wound will fall off; do not pull them off.  No bandage is needed on the site.  DO  NOT apply any creams, oils, or ointments to the wound area. - If you notice any drainage or discharge from the wound, any swelling or bruising at the site, or you develop a fever > 101? F after you are discharged home, call the office at once.  Special Instructions - You are still able to use cellular telephones; use the ear opposite the side where you have your pacemaker/defibrillator.  Avoid carrying your cellular phone near your device. - When traveling through airports, show security personnel your identification card to avoid being screened in the metal detectors.  Ask the security personnel to use the hand wand. - Avoid arc welding equipment, MRI testing (magnetic resonance imaging), TENS units (transcutaneous nerve stimulators).  Call the office for questions about other devices. - Avoid electrical appliances that are in poor condition or are not properly grounded. - Microwave ovens are safe to be near or to operate.  Additional information for defibrillator patients should your device go off: - If your device goes off ONCE and you feel fine afterward, notify the device clinic nurses. - If your device goes off ONCE and you do not feel well afterward, call 911. - If your device  goes off TWICE, call 911. - If your device goes off THREE times in one day, call 911.  DO NOT DRIVE YOURSELF OR A FAMILY MEMBER WITH A DEFIBRILLATOR TO THE HOSPITAL--CALL 911.

## 2017-07-18 ENCOUNTER — Ambulatory Visit (HOSPITAL_COMMUNITY): Payer: Medicare Other

## 2017-07-18 DIAGNOSIS — Z882 Allergy status to sulfonamides status: Secondary | ICD-10-CM | POA: Diagnosis not present

## 2017-07-18 DIAGNOSIS — Z7984 Long term (current) use of oral hypoglycemic drugs: Secondary | ICD-10-CM | POA: Diagnosis not present

## 2017-07-18 DIAGNOSIS — Z880 Allergy status to penicillin: Secondary | ICD-10-CM | POA: Diagnosis not present

## 2017-07-18 DIAGNOSIS — Z85820 Personal history of malignant melanoma of skin: Secondary | ICD-10-CM | POA: Diagnosis not present

## 2017-07-18 DIAGNOSIS — Z45018 Encounter for adjustment and management of other part of cardiac pacemaker: Secondary | ICD-10-CM | POA: Diagnosis not present

## 2017-07-18 DIAGNOSIS — Z79899 Other long term (current) drug therapy: Secondary | ICD-10-CM | POA: Diagnosis not present

## 2017-07-18 DIAGNOSIS — I442 Atrioventricular block, complete: Secondary | ICD-10-CM

## 2017-07-18 DIAGNOSIS — E669 Obesity, unspecified: Secondary | ICD-10-CM | POA: Diagnosis not present

## 2017-07-18 DIAGNOSIS — Z950 Presence of cardiac pacemaker: Secondary | ICD-10-CM | POA: Diagnosis not present

## 2017-07-18 DIAGNOSIS — Z7982 Long term (current) use of aspirin: Secondary | ICD-10-CM | POA: Diagnosis not present

## 2017-07-18 DIAGNOSIS — I447 Left bundle-branch block, unspecified: Secondary | ICD-10-CM | POA: Diagnosis not present

## 2017-07-18 DIAGNOSIS — I5022 Chronic systolic (congestive) heart failure: Secondary | ICD-10-CM | POA: Diagnosis not present

## 2017-07-18 LAB — GLUCOSE, CAPILLARY: Glucose-Capillary: 186 mg/dL — ABNORMAL HIGH (ref 65–99)

## 2017-07-18 LAB — HEMOGLOBIN A1C
HEMOGLOBIN A1C: 7 % — AB (ref 4.8–5.6)
MEAN PLASMA GLUCOSE: 154.2 mg/dL

## 2017-07-18 MED FILL — Gentamicin Sulfate Inj 40 MG/ML: INTRAMUSCULAR | Qty: 2 | Status: AC

## 2017-07-18 MED FILL — Sodium Chloride Irrigation Soln 0.9%: Qty: 500 | Status: AC

## 2017-07-18 NOTE — Progress Notes (Signed)
Pt discharged with her son, escorted to car by volunteer, via wheelchair.   Pt denies complaints upon departure, her VS are WNL and she has remained uneventful since earlier complaint of 'not feeling well'.   pts condition at that time was reported to EP PA, all sx resolved when pt had BM.   Pt advised to monitor the patterns of these feelings at home to see if they continue to coincide with BM/Rectal pressure.

## 2017-07-18 NOTE — Discharge Summary (Signed)
ELECTROPHYSIOLOGY PROCEDURE DISCHARGE SUMMARY    Patient ID: Melissa Novak,  MRN: [id number], DOB/AGE: 81-Jun-1935 81 y.o.  Admit date: 07/17/2017 Discharge date: 07/18/2017  Primary Care Physician: Lajean Manes, MD  Primary Cardiologist: Dr. Marlou Porch Electrophysiologist: Dr. Lovena Le  Primary Discharge Diagnosis:  1. CHB 2. NICM 3. LBBB  Secondary Discharge Diagnosis:  1. HTN 2. Chronic CHF  Allergies  Allergen Reactions  . Sulfa Antibiotics Anaphylaxis  . Percocet [Oxycodone-Acetaminophen] Rash  . Tramadol Hives, Nausea Only and Other (See Comments)    Blurred vision  . Benadryl [Diphenhydramine Hcl] Rash and Other (See Comments)    BLURRED VISION  . Cephalexin Rash  . Codeine Rash    Hydrocodone is the only codeine product that is ok to take  . Demerol Nausea Only and Rash  . Dilaudid [Hydromorphone Hcl] Nausea And Vomiting  . Dimenhydrinate Rash and Other (See Comments)    BLURRED VISION  . Meclizine Hcl Rash and Other (See Comments)    BLURRED VISION  . Penicillins Rash and Other (See Comments)    FEVER Has patient had a PCN reaction causing immediate rash, facial/tongue/throat swelling, SOB or lightheadedness with hypotension: Yes Has patient had a PCN reaction causing severe rash involving mucus membranes or skin necrosis: No Has patient had a PCN reaction that required hospitalization: Yes Has patient had a PCN reaction occurring within the last 10 years: No If all of the above answers are "NO", then may proceed with Cephalosporin use.   Marland Kitchen Pentazocine Lactate Rash and Other (See Comments)    FEVER     Procedures This Admission:  1.  Implantation of a LV lead with upgrade from dual chamber to CRT-P on 07/17/17 by Dr Lovena Le.  The patient received a St. Jude quadripolar LV pacing lead, serial [id number] , St. Jude biventricular pacemaker, serial number [id number] There were no immediate post procedure complications. 2.  CXR on 07/18/17 demonstrated no  pneumothorax status post device implantation.   Brief HPI: Melissa Novak is a 83 y.o. female was referred to electrophysiology in the outpatient setting for consideration of PPM implantation.  Past medical history includes CHB with development of CM, LBBB, HTN, CHF.   Risks, benefits, and alternatives of upgrade to CRT- PPM implantation were reviewed with the patient who wished to proceed.   Hospital Course:  The patient was admitted and underwent implantation of a LV lead and CRT-P with details as outlined above. She was monitored on telemetry overnight which demonstrated SR, V paced.  Left chest was without hematoma or ecchymosis.  The device was interrogated and found to be functioning normally.  CXR was obtained and demonstrated no pneumothorax status post device implantation.  Wound care, arm mobility, and restrictions were reviewed with the patient.  The patient was examined by Dr. Lovena Le and considered stable for discharge to home.    Physical Exam: Vitals:   07/17/17 1910 07/17/17 1936 07/18/17 0027 07/18/17 0512  BP: 134/62 139/71 (!) 145/62 (!) 144/72  Pulse:  95 96 96  Resp:  18 18 18   Temp:  98 F (36.7 C)  98 F (36.7 C)  TempSrc:  Oral  Oral  SpO2:  95% 94% 92%  Weight:    219 lb 9.6 oz (99.6 kg)  Height:        GEN- The patient is well appearing, alert and oriented x 3 today.   HEENT: normocephalic, atraumatic; sclera clear, conjunctiva pink; hearing intact; neck supple, no JVP Lungs- CTA b/l, normal  work of breathing.  No wheezes, rales, rhonchi Heart- RRR, soft SM, no rubs or gallops, PMI not laterally displaced GI- soft, non-tender, non-distended Extremities- no clubbing, cyanosis, or edema MS- no significant deformity or atrophy Skin- warm and dry, no rash or lesion, left chest without hematoma, minimal ecchymosis Psych- euthymic mood, full affect Neuro- no gross deficits   Labs:   Lab Results  Component Value Date   WBC 8.0 07/14/2017   HGB 14.0  07/14/2017   HCT 40.6 07/14/2017   MCV 96 07/14/2017   PLT 299 07/14/2017     Recent Labs Lab 07/14/17 1143  NA 140  K 4.3  CL 102  CO2 23  BUN 25  CREATININE 0.89  CALCIUM 9.8  GLUCOSE 198*    Discharge Medications:  Allergies as of 07/18/2017      Reactions   Sulfa Antibiotics Anaphylaxis   Percocet [oxycodone-acetaminophen] Rash   Tramadol Hives, Nausea Only, Other (See Comments)   Blurred vision   Benadryl [diphenhydramine Hcl] Rash, Other (See Comments)   BLURRED VISION   Cephalexin Rash   Codeine Rash   Hydrocodone is the only codeine product that is ok to take   Demerol Nausea Only, Rash   Dilaudid [hydromorphone Hcl] Nausea And Vomiting   Dimenhydrinate Rash, Other (See Comments)   BLURRED VISION   Meclizine Hcl Rash, Other (See Comments)   BLURRED VISION   Penicillins Rash, Other (See Comments)   FEVER Has patient had a PCN reaction causing immediate rash, facial/tongue/throat swelling, SOB or lightheadedness with hypotension: Yes Has patient had a PCN reaction causing severe rash involving mucus membranes or skin necrosis: No Has patient had a PCN reaction that required hospitalization: Yes Has patient had a PCN reaction occurring within the last 10 years: No If all of the above answers are "NO", then may proceed with Cephalosporin use.   Pentazocine Lactate Rash, Other (See Comments)   FEVER      Medication List    TAKE these medications   amLODipine 2.5 MG tablet Commonly known as:  NORVASC Take 2.5 mg by mouth daily.   aspirin 81 MG tablet Take 81 mg by mouth every evening.   atorvastatin 10 MG tablet Commonly known as:  LIPITOR Take 10 mg by mouth daily.   calcium carbonate 600 MG Tabs tablet Commonly known as:  OS-CAL Take 600 mg by mouth daily.   FEMRING 0.1 MG/24HR Ring Generic drug:  Estradiol Acetate Place 1 each vaginally See admin instructions. Change every 90 days   furosemide 40 MG tablet Commonly known as:  LASIX Take 1.5  tablets (60 mg total) by mouth daily. What changed:  how much to take  Another medication with the same name was removed. Continue taking this medication, and follow the directions you see here.   GARLIC PO Take 1 tablet by mouth daily.   HYDROcodone-acetaminophen 5-325 MG tablet Commonly known as:  NORCO Take 1-2 tablets by mouth every 6 (six) hours as needed for moderate pain or severe pain.   ibuprofen 200 MG tablet Commonly known as:  ADVIL,MOTRIN Take 400 mg by mouth daily as needed for moderate pain.   losartan 50 MG tablet Commonly known as:  COZAAR Take 50 mg by mouth 2 (two) times daily.   metFORMIN 500 MG 24 hr tablet Commonly known as:  GLUCOPHAGE-XR Take 500 mg by mouth daily with supper. Notes to patient:  No metformin for 48 hours   multivitamin with minerals Tabs tablet Take 1 tablet by  mouth daily.   OSTEO BI-FLEX TRIPLE STRENGTH Tabs Take 1 tablet by mouth at bedtime.   saccharomyces boulardii 250 MG capsule Commonly known as:  FLORASTOR Take 250 mg by mouth at bedtime.   sodium chloride 0.65 % Soln nasal spray Commonly known as:  OCEAN Place 1 spray into both nostrils at bedtime.   THERATEARS OP Apply 1 drop to eye daily as needed (dry eyes).   valACYclovir 1000 MG tablet Commonly known as:  VALTREX Take 1,000 mg by mouth daily.   vitamin C 1000 MG tablet Take 1,000 mg by mouth daily.   VITAMIN D PO Take 1 tablet by mouth at bedtime.   ZINC PO Take 1 tablet by mouth daily.            Discharge Care Instructions        Start     Ordered   07/18/17 0000  Increase activity slowly     07/18/17 0901   07/18/17 0000  Diet - low sodium heart healthy     07/18/17 0901      Disposition:  Home Discharge Instructions    Diet - low sodium heart healthy    Complete by:  As directed    Increase activity slowly    Complete by:  As directed      Follow-up Information    [facility] Follow up on 07/31/2017.     Specialty:  Cardiology Why:  11:30AM, wound check Contact information: [address], [place]       Jerline Pain, MD Follow up on 09/14/2017.   Specialty:  Cardiology Why:  11:00AM Contact information: [address] [address] [phone]        Evans Lance, MD Follow up on 10/20/2017.   Specialty:  Cardiology Why:  12:00PM (noon) Contact information: [address] [address] [phone]           Duration of Discharge Encounter: Greater than 30 minutes including physician time.  Venetia Night, PA-C 07/18/2017 9:06 AM  EP attending  Patient seen and examined. Agree with the findings as documented above. Pacemaker interrogation is been carried out under my direction demonstrating normal biventricular pacemaker function. Her wound looks good. Chest x-ray demonstrates stable lead position. She will be discharged home, with usual follow-up.  Cristopher Peru, M.D.

## 2017-07-18 NOTE — Progress Notes (Signed)
Call placed to [facility] to notify of telemetry monitoring d/c.   

## 2017-07-20 ENCOUNTER — Telehealth: Payer: Self-pay | Admitting: Internal Medicine

## 2017-07-20 NOTE — Telephone Encounter (Signed)
New Message     Pt had Monday and now she is having runny nose and cough and would like to speak with a nurse

## 2017-07-20 NOTE — Telephone Encounter (Signed)
Pt had an ICD placement on 8/27. Today when she woke up she had a running nose and a cough. Pt was advance to call her PCP. Pt is stressing the  need to speak with Dr Tanna Furry nurse or even wants to speak with Lorenda Hatchet, because she wants to now if Dr Lovena Le thinks that she got exposed to something in the hospital.

## 2017-07-20 NOTE — Telephone Encounter (Signed)
Patient called with c/o of runny nose after gen change. She states that everything is going well post implant with incision. She states that she has not called her PCP because she knows that she obtained a runny nose from the hospital and thus feels that Dr. Lovena Le should manage this problem. I explained that her PCP would be best able to help her best. She confirmed understanding and states she will now call her PCP.

## 2017-07-21 ENCOUNTER — Telehealth: Payer: Self-pay | Admitting: Internal Medicine

## 2017-07-21 NOTE — Telephone Encounter (Signed)
°  New Prob  States she has come home from hospital visit. States she now has a cough and needs recommendation for safe OTC medications for her. Please call.

## 2017-07-21 NOTE — Telephone Encounter (Signed)
Spoke with the patient. She recently had a Bi-V upgrade placed with Dr. Lovena Le on 07/17/17. She currently has a dry cough that has developed since she has been home. I advised her that she may take plain robitussin without the "D." She is aware if her cough persist, becomes productive, or she develops a fever associated with this, she needs to call her PCP.  She voices understanding. Otherwise, she states she is feeling well and no issues with her device.

## 2017-07-25 ENCOUNTER — Telehealth: Payer: Self-pay | Admitting: Internal Medicine

## 2017-07-25 NOTE — Telephone Encounter (Signed)
Follow Up:   Pt said she had a pacemaker upgrade last Monday. She said she had a question please.

## 2017-07-25 NOTE — Telephone Encounter (Signed)
Pt calling with question regarding wound site and ability to wear an undergarment.  Per device clinic, ok to wear undergarment as long as it is not painful for Pt.  Pt notified.

## 2017-07-31 ENCOUNTER — Telehealth: Payer: Self-pay | Admitting: Internal Medicine

## 2017-07-31 ENCOUNTER — Ambulatory Visit (INDEPENDENT_AMBULATORY_CARE_PROVIDER_SITE_OTHER): Payer: Medicare Other | Admitting: *Deleted

## 2017-07-31 DIAGNOSIS — I447 Left bundle-branch block, unspecified: Secondary | ICD-10-CM | POA: Diagnosis not present

## 2017-07-31 DIAGNOSIS — I442 Atrioventricular block, complete: Secondary | ICD-10-CM

## 2017-07-31 DIAGNOSIS — I5022 Chronic systolic (congestive) heart failure: Secondary | ICD-10-CM | POA: Diagnosis not present

## 2017-07-31 LAB — CUP PACEART INCLINIC DEVICE CHECK
Battery Remaining Longevity: 48 mo
Battery Voltage: 2.98 V
Brady Statistic RA Percent Paced: 0.04 %
Brady Statistic RV Percent Paced: 99.96 %
Date Time Interrogation Session: 20180910122154
Implantable Lead Implant Date: 20130408
Implantable Lead Implant Date: 20130408
Implantable Lead Implant Date: 20180827
Implantable Lead Location: 753858
Implantable Lead Location: 753859
Implantable Lead Location: 753860
Implantable Pulse Generator Implant Date: 20180827
Lead Channel Impedance Value: 387.5 Ohm
Lead Channel Impedance Value: 462.5 Ohm
Lead Channel Impedance Value: 600 Ohm
Lead Channel Pacing Threshold Amplitude: 0.625 V
Lead Channel Pacing Threshold Amplitude: 0.75 V
Lead Channel Pacing Threshold Amplitude: 0.75 V
Lead Channel Pacing Threshold Amplitude: 2 V
Lead Channel Pacing Threshold Pulse Width: 0.4 ms
Lead Channel Pacing Threshold Pulse Width: 0.4 ms
Lead Channel Pacing Threshold Pulse Width: 0.5 ms
Lead Channel Pacing Threshold Pulse Width: 0.8 ms
Lead Channel Sensing Intrinsic Amplitude: 5 mV
Lead Channel Setting Pacing Amplitude: 1.875
Lead Channel Setting Pacing Amplitude: 2 V
Lead Channel Setting Pacing Amplitude: 3 V
Lead Channel Setting Pacing Pulse Width: 0.5 ms
Lead Channel Setting Pacing Pulse Width: 0.8 ms
Lead Channel Setting Sensing Sensitivity: 5 mV
Pulse Gen Model: 3262
Pulse Gen Serial Number: 7946848

## 2017-07-31 NOTE — Telephone Encounter (Signed)
New message      Pt had pacemaker upgrade last august.  She want to know if she can have a mammogram?

## 2017-07-31 NOTE — Telephone Encounter (Signed)
[name] informed that she could have a mammogram 6 weeks after LV lead placement. OK after 08/28/17. She verbalizes understanding.

## 2017-07-31 NOTE — Progress Notes (Signed)
Wound check appointment. Steri-strips removed. Wound without redness or edema. Incision edges approximated, wound well healed. Stitch removed from R lateral incision. Patient encouraged to call for any redness, edema, or drainage. Normal device function. Thresholds, sensing, and impedances consistent with implant measurements. RA/RV/LV auto capture programmed on at implant. Increase in LV threshold noted--now 2.0V@ 0.54ms (was 1.25V@0 .48ms)--cxr was not ordered this ov. Histogram distribution appropriate for patient and level of activity. (3) mode switches (<1%), max dur. 6sec-AT. No high ventricular rates noted. Patient educated about wound care, arm mobility, lifting restrictions. ROV in 3 months with GT.

## 2017-08-07 ENCOUNTER — Telehealth: Payer: Self-pay | Admitting: Internal Medicine

## 2017-08-07 NOTE — Telephone Encounter (Signed)
New message    Pt is calling to talk to nurse about getting the flu shot. Please call.

## 2017-08-08 NOTE — Telephone Encounter (Signed)
Pt calling to see if Dr. Lovena Le thinks she should get the flu shot.   Advised Pt Dr. Lovena Le states she should get flu shot.  Pt states PCP said same thing.  Pt states she will get flu shot. No further action necessary.

## 2017-08-10 ENCOUNTER — Telehealth: Payer: Self-pay | Admitting: Internal Medicine

## 2017-08-10 NOTE — Telephone Encounter (Signed)
New message    Pt is calling to talk to nurse. She has some questions. Please call.

## 2017-08-11 NOTE — Telephone Encounter (Signed)
Spoke with Dr. Lovena Le.  Dr. Lovena Le will defer to Pt primary care provider for treatment.  Spoke with Pt.  Asked Pt to call PCP and let them know she is not any better.  Pt indicates understanding.  No further needs at this time.

## 2017-08-11 NOTE — Telephone Encounter (Signed)
Returned Pt call.  Pt states she has been coughing for 7 days.  Pt coughed while on the phone with this nurse.  Cough sounds wet, hard barking quality.  Pt states she called her PCP Dr. Felipa Eth a few days ago.  This nurse called Dr office to find out what prescribed for Pt.  Per medical records Pt was told to try delsym 12 hour for cough.  Will notify Dr. Lovena Le and continue to monitor.

## 2017-09-07 DIAGNOSIS — R32 Unspecified urinary incontinence: Secondary | ICD-10-CM | POA: Diagnosis not present

## 2017-09-14 ENCOUNTER — Ambulatory Visit: Payer: Medicare Other | Admitting: Cardiology

## 2017-09-14 DIAGNOSIS — Z23 Encounter for immunization: Secondary | ICD-10-CM | POA: Diagnosis not present

## 2017-10-09 DIAGNOSIS — R059 Cough, unspecified: Secondary | ICD-10-CM | POA: Insufficient documentation

## 2017-10-09 DIAGNOSIS — I442 Atrioventricular block, complete: Secondary | ICD-10-CM | POA: Insufficient documentation

## 2017-10-09 DIAGNOSIS — C801 Malignant (primary) neoplasm, unspecified: Secondary | ICD-10-CM | POA: Insufficient documentation

## 2017-10-09 DIAGNOSIS — R05 Cough: Secondary | ICD-10-CM | POA: Insufficient documentation

## 2017-10-09 DIAGNOSIS — I2510 Atherosclerotic heart disease of native coronary artery without angina pectoris: Secondary | ICD-10-CM | POA: Insufficient documentation

## 2017-10-19 ENCOUNTER — Other Ambulatory Visit: Payer: Medicare Other

## 2017-10-19 ENCOUNTER — Ambulatory Visit: Payer: Medicare Other | Admitting: Oncology

## 2017-10-19 ENCOUNTER — Other Ambulatory Visit (HOSPITAL_BASED_OUTPATIENT_CLINIC_OR_DEPARTMENT_OTHER): Payer: Medicare Other

## 2017-10-19 ENCOUNTER — Telehealth: Payer: Self-pay | Admitting: Oncology

## 2017-10-19 ENCOUNTER — Ambulatory Visit (HOSPITAL_BASED_OUTPATIENT_CLINIC_OR_DEPARTMENT_OTHER): Payer: Medicare Other | Admitting: Oncology

## 2017-10-19 VITALS — BP 132/53 | HR 99 | Temp 98.4°F | Resp 18 | Ht 62.0 in | Wt 208.8 lb

## 2017-10-19 DIAGNOSIS — Z85820 Personal history of malignant melanoma of skin: Secondary | ICD-10-CM

## 2017-10-19 DIAGNOSIS — C4361 Malignant melanoma of right upper limb, including shoulder: Secondary | ICD-10-CM

## 2017-10-19 LAB — COMPREHENSIVE METABOLIC PANEL
ALBUMIN: 3.8 g/dL (ref 3.5–5.0)
ALK PHOS: 79 U/L (ref 40–150)
ALT: 22 U/L (ref 0–55)
AST: 19 U/L (ref 5–34)
Anion Gap: 11 mEq/L (ref 3–11)
BILIRUBIN TOTAL: 0.67 mg/dL (ref 0.20–1.20)
BUN: 34.2 mg/dL — AB (ref 7.0–26.0)
CALCIUM: 10.5 mg/dL — AB (ref 8.4–10.4)
CO2: 27 mEq/L (ref 22–29)
Chloride: 104 mEq/L (ref 98–109)
Creatinine: 1.3 mg/dL — ABNORMAL HIGH (ref 0.6–1.1)
EGFR: 40 mL/min/{1.73_m2} — AB (ref >60)
GLUCOSE: 118 mg/dL (ref 70–140)
Potassium: 4.5 mEq/L (ref 3.5–5.1)
SODIUM: 141 meq/L (ref 136–145)
TOTAL PROTEIN: 7.3 g/dL (ref 6.4–8.3)

## 2017-10-19 LAB — CBC WITH DIFFERENTIAL/PLATELET
BASO%: 1.1 % (ref 0.0–2.0)
BASOS ABS: 0.1 10*3/uL (ref 0.0–0.1)
EOS ABS: 0.4 10*3/uL (ref 0.0–0.5)
EOS%: 5 % (ref 0.0–7.0)
HEMATOCRIT: 41.7 % (ref 34.8–46.6)
HEMOGLOBIN: 14 g/dL (ref 11.6–15.9)
LYMPH#: 1.9 10*3/uL (ref 0.9–3.3)
LYMPH%: 27.1 % (ref 14.0–49.7)
MCH: 32.1 pg (ref 25.1–34.0)
MCHC: 33.6 g/dL (ref 31.5–36.0)
MCV: 95.6 fL (ref 79.5–101.0)
MONO#: 0.5 10*3/uL (ref 0.1–0.9)
MONO%: 7 % (ref 0.0–14.0)
NEUT%: 59.8 % (ref 38.4–76.8)
NEUTROS ABS: 4.3 10*3/uL (ref 1.5–6.5)
Platelets: 252 10*3/uL (ref 145–400)
RBC: 4.36 10*6/uL (ref 3.70–5.45)
RDW: 14.8 % — AB (ref 11.2–14.5)
WBC: 7.2 10*3/uL (ref 3.9–10.3)

## 2017-10-19 NOTE — Telephone Encounter (Signed)
Scheduled appt per 11/29 los - Gave patient AVS and calender per los.  

## 2017-10-19 NOTE — Progress Notes (Signed)
Hematology and Oncology Follow Up Visit  Melissa Novak [id number] 1934/02/01 83 y.o. 10/19/2017 10:43 AM Melissa Novak, MDStoneking, Christiane Ha, MD   Principle Diagnosis: 83 year old woman diagnosed with malignant melanoma on 02/18/2016. She was found to have a lesion on her right forearm and a shave biopsy showed a 4.5 mm malignant melanoma with ulceration.   Prior Therapy: She is status post wide excision done on 03/17/2016. The final pathology showed a maximum tumor thickness of 4.5 mm with negative margins. Ulcerations are present. The tumor infiltration with lymphocytes was described as non-brisk. The mitotic index was 6/mm. Final pathology stage was pT4b NX MX.   Current therapy: Observation and surveillance.  Interim History: [name] presents today for a follow-up visit. Since her last visit, she reports no complaints.  She remains active and lives independently.  Her mobility has slowed down slightly but no major decline in her performance status. She does not report any constitutional symptoms of weight loss, fevers or chills. She continues to follow with dermatology.  She denies any new skin rashes or lesions.  She denies any recent hospitalizations or illnesses.  She does not report any headaches, vision, syncope or seizures. She does not report any fevers, chills sweats. Does not report any cough, wheezing or hemoptysis. Does not report any nausea, vomiting or abdominal pain. Does not report any chest pain, palpitation orthopnea. Does not report any cough, wheezing or hemoptysis. Does not report any nausea, vomiting or abdominal pain. Does not report any gross satiety or change in her bowel habits. She does not report any frequency urgency or hesitancy. Remaining review of system is unremarkable.    Medications: I have reviewed the patient's current medications.  Current Outpatient Medications  Medication Sig Dispense Refill  . amLODipine (NORVASC) 2.5 MG tablet Take 2.5 mg by  mouth daily.    . Ascorbic Acid (VITAMIN C) 1000 MG tablet Take 1,000 mg by mouth daily.    Marland Kitchen aspirin 81 MG tablet Take 81 mg by mouth every evening.     Marland Kitchen atorvastatin (LIPITOR) 10 MG tablet Take 10 mg by mouth daily.     . calcium carbonate (OS-CAL) 600 MG TABS tablet Take 600 mg by mouth daily.     . Carboxymethylcellulose Sodium (THERATEARS OP) Apply 1 drop to eye daily as needed (dry eyes).    . Cholecalciferol (VITAMIN D PO) Take 1 tablet by mouth at bedtime.     Marland Kitchen FEMRING 0.1 MG/24HR RING Place 1 each vaginally See admin instructions. Change every 90 days    . furosemide (LASIX) 40 MG tablet Take 1.5 tablets (60 mg total) by mouth daily. (Patient taking differently: Take 40 mg by mouth daily. ) 45 tablet 6  . GARLIC PO Take 1 tablet by mouth daily.     Marland Kitchen HYDROcodone-acetaminophen (NORCO) 5-325 MG tablet Take 1-2 tablets by mouth every 6 (six) hours as needed for moderate pain or severe pain. 30 tablet 0  . ibuprofen (ADVIL,MOTRIN) 200 MG tablet Take 400 mg by mouth daily as needed for moderate pain.    Marland Kitchen losartan (COZAAR) 50 MG tablet Take 50 mg by mouth 2 (two) times daily.    . metFORMIN (GLUCOPHAGE-XR) 500 MG 24 hr tablet Take 500 mg by mouth daily with supper.  3  . Misc Natural Products (OSTEO BI-FLEX TRIPLE STRENGTH) TABS Take 1 tablet by mouth at bedtime.    . Multiple Vitamin (MULTIVITAMIN WITH MINERALS) TABS Take 1 tablet by mouth daily.    . Multiple  Vitamins-Minerals (ZINC PO) Take 1 tablet by mouth daily.     Marland Kitchen saccharomyces boulardii (FLORASTOR) 250 MG capsule Take 250 mg by mouth at bedtime.    . sodium chloride (OCEAN) 0.65 % SOLN nasal spray Place 1 spray into both nostrils at bedtime.    . valACYclovir (VALTREX) 1000 MG tablet Take 1,000 mg by mouth daily.     No current facility-administered medications for this visit.      Allergies:  Allergies  Allergen Reactions  . Sulfa Antibiotics Anaphylaxis  . Percocet [Oxycodone-Acetaminophen] Rash  . Tramadol Hives,  Nausea Only and Other (See Comments)    Blurred vision  . Benadryl [Diphenhydramine Hcl] Rash and Other (See Comments)    BLURRED VISION  . Cephalexin Rash  . Codeine Rash    Hydrocodone is the only codeine product that is ok to take  . Demerol Nausea Only and Rash  . Dilaudid [Hydromorphone Hcl] Nausea And Vomiting  . Dimenhydrinate Rash and Other (See Comments)    BLURRED VISION  . Meclizine Hcl Rash and Other (See Comments)    BLURRED VISION  . Penicillins Rash and Other (See Comments)    FEVER Has patient had a PCN reaction causing immediate rash, facial/tongue/throat swelling, SOB or lightheadedness with hypotension: Yes Has patient had a PCN reaction causing severe rash involving mucus membranes or skin necrosis: No Has patient had a PCN reaction that required hospitalization: Yes Has patient had a PCN reaction occurring within the last 10 years: No If all of the above answers are "NO", then may proceed with Cephalosporin use.   Marland Kitchen Pentazocine Lactate Rash and Other (See Comments)    FEVER    Past Medical History, Surgical history, Social history, and Family History were reviewed and updated.   Physical Exam: Blood pressure (!) 132/53, pulse 99, temperature 98.4 F (36.9 C), temperature source Oral, resp. rate 18, height 5\' 2"  (1.575 m), weight 208 lb 12.8 oz (94.7 kg), SpO2 92 %. ECOG: 1 General appearance: Well-appearing woman without distress. Head: Normocephalic, without obvious abnormality no oral ulcers or lesions. Neck: no adenopathy Lymph nodes: Cervical, supraclavicular, and axillary nodes normal.  Heart:regular rate and rhythm, S1, S2 normal, no murmur, click, rub or gallop Lung:chest clear, no wheezing, rales, normal symmetric air entry Abdomin: soft, non-tender, without masses or organomegaly no shifting dullness or ascites. EXT:no erythema, induration, or nodules Skin: No rashes or lesions.   Lab Results: Lab Results  Component Value Date   WBC 7.2  10/19/2017   HGB 14.0 10/19/2017   HCT 41.7 10/19/2017   MCV 95.6 10/19/2017   PLT 252 10/19/2017     Chemistry      Component Value Date/Time   NA 140 07/14/2017 1143   NA 141 10/20/2016 1251   K 4.3 07/14/2017 1143   K 4.1 10/20/2016 1251   CL 102 07/14/2017 1143   CO2 23 07/14/2017 1143   CO2 22 10/20/2016 1251   BUN 25 07/14/2017 1143   BUN 22.4 10/20/2016 1251   CREATININE 0.89 07/14/2017 1143   CREATININE 0.8 10/20/2016 1251      Component Value Date/Time   CALCIUM 9.8 07/14/2017 1143   CALCIUM 10.0 10/20/2016 1251   ALKPHOS 88 10/20/2016 1251   AST 25 10/20/2016 1251   ALT 30 10/20/2016 1251   BILITOT 0.58 10/20/2016 1251      Impression and Plan:  83 year old woman with the following issues:  1. Malignant melanoma diagnosed on 02/18/2016. She had a lesion that had grown  over the years and underwent shave biopsy which confirmed the presence of a 4.5 mm malignant melanoma with ulceration. She underwent a nuclear medicine lymphangiography and a sentinel node was not able to be identified.   She is status post wide excision done on 03/17/2016 which showed the final pathology to be T4b with ulceration and non-brisk lymphocyte tumor infiltration. She declined adjuvant therapy at that time. She continues to be on active surveillance without any evidence of recurrence.  The plan is to continue with active surveillance on an annual basis with laboratory testing and physical examination.  Systemic therapy will be used if he develops advanced disease.  Imaging studies will be repeated if she has any symptoms.   2. Follow-up: Will be in 12 months sooner if needed to.    Zola Button, MD 11/29/201810:43 AM

## 2017-10-20 ENCOUNTER — Encounter: Payer: Self-pay | Admitting: Internal Medicine

## 2017-10-20 ENCOUNTER — Ambulatory Visit (INDEPENDENT_AMBULATORY_CARE_PROVIDER_SITE_OTHER): Payer: Medicare Other | Admitting: Internal Medicine

## 2017-10-20 VITALS — BP 137/69 | HR 99 | Ht 62.0 in | Wt 209.2 lb

## 2017-10-20 DIAGNOSIS — I442 Atrioventricular block, complete: Secondary | ICD-10-CM | POA: Diagnosis not present

## 2017-10-20 DIAGNOSIS — I5022 Chronic systolic (congestive) heart failure: Secondary | ICD-10-CM

## 2017-10-20 DIAGNOSIS — I10 Essential (primary) hypertension: Secondary | ICD-10-CM | POA: Diagnosis not present

## 2017-10-20 LAB — CUP PACEART INCLINIC DEVICE CHECK
Date Time Interrogation Session: 20181130135713
Implantable Lead Implant Date: 20130408
Implantable Lead Implant Date: 20180827
Implantable Lead Location: 753858
Implantable Lead Location: 753859
Implantable Pulse Generator Implant Date: 20180827
Lead Channel Impedance Value: 412.5 Ohm
Lead Channel Impedance Value: 837.5 Ohm
Lead Channel Pacing Threshold Amplitude: 0.5 V
Lead Channel Pacing Threshold Amplitude: 1.75 V
Lead Channel Pacing Threshold Pulse Width: 0.5 ms
Lead Channel Pacing Threshold Pulse Width: 0.8 ms
Lead Channel Sensing Intrinsic Amplitude: 5 mV
Lead Channel Setting Pacing Amplitude: 1.75 V
Lead Channel Setting Pacing Amplitude: 2.75 V
Lead Channel Setting Pacing Pulse Width: 0.8 ms
MDC IDC LEAD IMPLANT DT: 20130408
MDC IDC LEAD LOCATION: 753860
MDC IDC MSMT BATTERY REMAINING LONGEVITY: 52 mo
MDC IDC MSMT BATTERY VOLTAGE: 2.96 V
MDC IDC MSMT LEADCHNL RA PACING THRESHOLD AMPLITUDE: 0.75 V
MDC IDC MSMT LEADCHNL RA PACING THRESHOLD AMPLITUDE: 0.75 V
MDC IDC MSMT LEADCHNL RA PACING THRESHOLD PULSEWIDTH: 0.4 ms
MDC IDC MSMT LEADCHNL RA PACING THRESHOLD PULSEWIDTH: 0.4 ms
MDC IDC MSMT LEADCHNL RV IMPEDANCE VALUE: 462.5 Ohm
MDC IDC SET LEADCHNL RV PACING AMPLITUDE: 2 V
MDC IDC SET LEADCHNL RV PACING PULSEWIDTH: 0.5 ms
MDC IDC SET LEADCHNL RV SENSING SENSITIVITY: 5 mV
MDC IDC STAT BRADY RA PERCENT PACED: 0.04 %
MDC IDC STAT BRADY RV PERCENT PACED: 99.96 %
Pulse Gen Serial Number: 7946848

## 2017-10-20 NOTE — Progress Notes (Signed)
HPI Melissa Novak returns today for ongoing evaluation and management of her biventricular pacemaker. She has done well in the interim. She denies chest pain. Her shortness of breath is much improved. No syncope. Allergies  Allergen Reactions  . Sulfa Antibiotics Anaphylaxis  . Percocet [Oxycodone-Acetaminophen] Rash  . Tramadol Hives, Nausea Only and Other (See Comments)    Blurred vision  . Benadryl [Diphenhydramine Hcl] Rash and Other (See Comments)    BLURRED VISION  . Cephalexin Rash  . Codeine Rash    Hydrocodone is the only codeine product that is ok to take  . Demerol Nausea Only and Rash  . Dilaudid [Hydromorphone Hcl] Nausea And Vomiting  . Dimenhydrinate Rash and Other (See Comments)    BLURRED VISION  . Meclizine Hcl Rash and Other (See Comments)    BLURRED VISION  . Penicillins Rash and Other (See Comments)    FEVER Has patient had a PCN reaction causing immediate rash, facial/tongue/throat swelling, SOB or lightheadedness with hypotension: Yes Has patient had a PCN reaction causing severe rash involving mucus membranes or skin necrosis: No Has patient had a PCN reaction that required hospitalization: Yes Has patient had a PCN reaction occurring within the last 10 years: No If all of the above answers are "NO", then may proceed with Cephalosporin use.   Marland Kitchen Pentazocine Lactate Rash and Other (See Comments)    FEVER     Current Outpatient Medications  Medication Sig Dispense Refill  . amLODipine (NORVASC) 2.5 MG tablet Take 2.5 mg by mouth daily.    . Ascorbic Acid (VITAMIN C) 1000 MG tablet Take 1,000 mg by mouth daily.    Marland Kitchen aspirin 81 MG tablet Take 81 mg by mouth every evening.     Marland Kitchen atorvastatin (LIPITOR) 10 MG tablet Take 10 mg by mouth daily.     . calcium carbonate (OS-CAL) 600 MG TABS tablet Take 600 mg by mouth daily.     . Carboxymethylcellulose Sodium (THERATEARS OP) Apply 1 drop to eye daily as needed (dry eyes).    . furosemide (LASIX) 40 MG  tablet Take 1.5 tablets (60 mg total) by mouth daily. (Patient taking differently: Take 40 mg by mouth daily. ) 45 tablet 6  . GARLIC PO Take 1 tablet by mouth daily.     Marland Kitchen HYDROcodone-acetaminophen (NORCO) 5-325 MG tablet Take 1-2 tablets by mouth every 6 (six) hours as needed for moderate pain or severe pain. 30 tablet 0  . ibuprofen (ADVIL,MOTRIN) 200 MG tablet Take 400 mg by mouth daily as needed for moderate pain.    Marland Kitchen losartan (COZAAR) 50 MG tablet Take 50 mg by mouth 2 (two) times daily.    . metFORMIN (GLUCOPHAGE-XR) 500 MG 24 hr tablet Take 500 mg by mouth daily with supper.  3  . Misc Natural Products (OSTEO BI-FLEX TRIPLE STRENGTH) TABS Take 1 tablet by mouth at bedtime.    . Multiple Vitamin (MULTIVITAMIN WITH MINERALS) TABS Take 1 tablet by mouth daily.    . Multiple Vitamins-Minerals (ZINC PO) Take 1 tablet by mouth daily.     Marland Kitchen saccharomyces boulardii (FLORASTOR) 250 MG capsule Take 250 mg by mouth at bedtime.    . valACYclovir (VALTREX) 1000 MG tablet Take 1,000 mg by mouth daily.     No current facility-administered medications for this visit.      Past Medical History:  Diagnosis Date  . Cancer ([place])    melanoma right forearm  . Complete heart block ([place])   .  Coronary artery disease CARDIOLOGIST- DR Gillian Shields-  LAST VISIT THIS WEEK--  WILL REQUEST NOTE, STRESS TEST    PT DENIES S & S  . Cough    pt states has had cough  5-7 weeks. pcp aware  . Diverticulosis   . History of pulmonary embolism 1970'S  . History of shingles 2004 --  EYES  , NO RESIDUAL PAIN  . Hypertension   . LBBB (left bundle branch block)   . Malignant melanoma of right forearm ([place]) 03/17/2016  . Pacemaker 4/13   DDD St Jude  . PONV (postoperative nausea and vomiting)    pt states no n/v w 4/13 surgery  . Prolapse of anterior vaginal wall     ROS:   All systems reviewed and negative except as noted in the HPI.   Past Surgical History:  Procedure Laterality Date  . BIV UPGRADE N/A 07/17/2017     Procedure: BiV Upgrade;  Surgeon: Evans Lance, MD;  Location: [facility];  Service: Cardiovascular;  Laterality: N/A;  . BREAST SURGERY     biopsy  . CARDIOVASCULAR STRESS TEST  02-14-2012  DR SKAINS   LOW RISK NUCLEAR TEST/ MILD FIXED DEFECT ALONG THE DISTAL ANTERIOR SEPTAL WALL CONSTISTENT WITH LBBB/ NO ISCHEMIA   . CATARACT EXTRACTION W/ INTRAOCULAR LENS  IMPLANT, BILATERAL    . CHOLECYSTECTOMY  1967  . INSERT / REPLACE / REMOVE PACEMAKER  4/13   ddd St Jude pacer,d/t symptomatic bradycardia w intermittent complete heart block.  Inserted By Dr. Crissie Sickles 4/13  . MELANOMA EXCISION Right 03/17/2016   Procedure: WIDE EXCISION MELANOMA RIGHT FOREARM;  Surgeon: Fanny Skates, MD;  Location: [facility];  Service: General;  Laterality: Right;  . PERMANENT PACEMAKER INSERTION N/A 02/27/2012   Procedure: PERMANENT PACEMAKER INSERTION;  Surgeon: Evans Lance, MD;  Location: [facility];  Service: Cardiovascular;  Laterality: N/A;  . 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 PUBOVAGINAL SLING  12/07/2012   Procedure: Gaynelle Arabian;  Surgeon: Ailene Rud, MD;  Location: [facility];  Service: Urology;  Laterality: N/A;  solyx sling   . TRANSTHORACIC ECHOCARDIOGRAM  02-15-2012  DR SKAINS   NORMAL EF 60-65%/ MILD AORTIC SCLEROSIS, NO STENOSIS, GRADE I DIASTOLIC DYSFUNCTION  . VAGINAL HYSTERECTOMY  1974   STILL HAS OVARIES     Family History  Problem Relation Age of Onset  . Cancer Mother   . Cancer Father   . Kidney disease Son   . Cancer Brother   . Kidney disease Brother      Social History   Socioeconomic History  . Marital status: Widowed    Spouse name: Not on file  . Number of children: Not on file  . Years of education: Not on file  . Highest  education level: Not on file  Social Needs  . Financial resource strain: Not on file  . Food insecurity - worry: Not on file  . Food insecurity - inability: Not on file  . Transportation needs - medical: Not on file  . Transportation needs - non-medical: Not on file  Occupational History  . Not on file  Tobacco Use  . Smoking status: Former Research scientist (life sciences)  . Smokeless tobacco: Never Used  . Tobacco comment: have not smoked in 50 years 03/16/16  Substance  and Sexual Activity  . Alcohol use: Yes    Comment: RARE  . Drug use: No  . Sexual activity: Not on file  Other Topics Concern  . Not on file  Social History Narrative  . Not on file     BP 137/69   Pulse 99   Ht 5\' 2"  (1.575 m)   Wt 209 lb 3.2 oz (94.9 kg)   SpO2 90%   BMI 38.26 kg/m   Physical Exam:  Well appearing 83 year old woman, NAD HEENT: Unremarkable Neck:  6 cm JVD, no thyromegally Lymphatics:  No adenopathy Back:  No CVA tenderness Lungs:  Clear, with no wheezes, rales, or rhonchi HEART:  Regular rate rhythm, no murmurs, no rubs, no clicks Abd:  soft, positive bowel sounds, no organomegally, no rebound, no guarding Ext:  2 plus pulses, no edema, no cyanosis, no clubbing Skin:  No rashes no nodules Neuro:  CN II through XII intact, motor grossly intact  EKG - normal sinus rhythm with P synchronous biventricular pacing  DEVICE  Normal device function.  See PaceArt for details.   Assess/Plan: 1. Complete heart block - she is doing well status post pacemaker insertion 2. Biventricular pacemaker - her St. Jude dual-chamber pacemaker, is working normally. We'll plan to recheck in several months. 3. Hypertension - her systolic blood pressure is minimally elevated today. She is encouraged to maintain a low-sodium diet.  Mikle Bosworth.D.

## 2017-10-20 NOTE — Patient Instructions (Signed)

## 2017-10-23 DIAGNOSIS — E119 Type 2 diabetes mellitus without complications: Secondary | ICD-10-CM | POA: Diagnosis not present

## 2017-10-23 DIAGNOSIS — I10 Essential (primary) hypertension: Secondary | ICD-10-CM | POA: Diagnosis not present

## 2017-10-23 DIAGNOSIS — Z7984 Long term (current) use of oral hypoglycemic drugs: Secondary | ICD-10-CM | POA: Diagnosis not present

## 2017-10-23 DIAGNOSIS — N3941 Urge incontinence: Secondary | ICD-10-CM | POA: Diagnosis not present

## 2017-10-23 DIAGNOSIS — F329 Major depressive disorder, single episode, unspecified: Secondary | ICD-10-CM | POA: Diagnosis not present

## 2017-10-23 DIAGNOSIS — Z0000 Encounter for general adult medical examination without abnormal findings: Secondary | ICD-10-CM | POA: Diagnosis not present

## 2017-10-23 DIAGNOSIS — Z6839 Body mass index (BMI) 39.0-39.9, adult: Secondary | ICD-10-CM | POA: Diagnosis not present

## 2017-10-23 DIAGNOSIS — Z1389 Encounter for screening for other disorder: Secondary | ICD-10-CM | POA: Diagnosis not present

## 2017-11-10 ENCOUNTER — Encounter: Payer: Medicare Other | Admitting: Internal Medicine

## 2017-11-15 ENCOUNTER — Ambulatory Visit
Admission: RE | Admit: 2017-11-15 | Discharge: 2017-11-15 | Disposition: A | Discharge status: Home / Self Care | Payer: Medicare Other | Source: Ambulatory Visit | Attending: Geriatric Medicine | Admitting: Geriatric Medicine

## 2017-11-15 ENCOUNTER — Other Ambulatory Visit: Payer: Self-pay | Admitting: Geriatric Medicine

## 2017-11-15 DIAGNOSIS — J209 Acute bronchitis, unspecified: Secondary | ICD-10-CM | POA: Diagnosis not present

## 2017-11-15 DIAGNOSIS — R05 Cough: Secondary | ICD-10-CM | POA: Diagnosis not present

## 2017-11-15 DIAGNOSIS — J40 Bronchitis, not specified as acute or chronic: Secondary | ICD-10-CM

## 2017-11-16 ENCOUNTER — Encounter: Payer: Medicare Other | Admitting: Internal Medicine

## 2017-11-29 ENCOUNTER — Other Ambulatory Visit: Payer: Self-pay | Admitting: Geriatric Medicine

## 2017-11-29 DIAGNOSIS — Z1231 Encounter for screening mammogram for malignant neoplasm of breast: Secondary | ICD-10-CM

## 2017-11-30 DIAGNOSIS — Z85820 Personal history of malignant melanoma of skin: Secondary | ICD-10-CM | POA: Diagnosis not present

## 2017-11-30 DIAGNOSIS — L7211 Pilar cyst: Secondary | ICD-10-CM | POA: Diagnosis not present

## 2017-11-30 DIAGNOSIS — L821 Other seborrheic keratosis: Secondary | ICD-10-CM | POA: Diagnosis not present

## 2017-12-08 ENCOUNTER — Telehealth: Payer: Self-pay | Admitting: Oncology

## 2017-12-08 NOTE — Telephone Encounter (Signed)
Tried to call patient.

## 2017-12-14 DIAGNOSIS — H26492 Other secondary cataract, left eye: Secondary | ICD-10-CM | POA: Diagnosis not present

## 2017-12-14 DIAGNOSIS — E119 Type 2 diabetes mellitus without complications: Secondary | ICD-10-CM | POA: Diagnosis not present

## 2017-12-14 DIAGNOSIS — H52203 Unspecified astigmatism, bilateral: Secondary | ICD-10-CM | POA: Diagnosis not present

## 2017-12-14 DIAGNOSIS — Z961 Presence of intraocular lens: Secondary | ICD-10-CM | POA: Diagnosis not present

## 2017-12-15 ENCOUNTER — Ambulatory Visit (INDEPENDENT_AMBULATORY_CARE_PROVIDER_SITE_OTHER): Payer: Medicare Other | Admitting: Cardiology

## 2017-12-15 ENCOUNTER — Encounter: Payer: Self-pay | Admitting: Cardiology

## 2017-12-15 ENCOUNTER — Encounter (INDEPENDENT_AMBULATORY_CARE_PROVIDER_SITE_OTHER): Payer: Self-pay

## 2017-12-15 VITALS — BP 122/70 | HR 100 | Ht 62.0 in | Wt 208.2 lb

## 2017-12-15 DIAGNOSIS — I10 Essential (primary) hypertension: Secondary | ICD-10-CM | POA: Diagnosis not present

## 2017-12-15 DIAGNOSIS — R0602 Shortness of breath: Secondary | ICD-10-CM

## 2017-12-15 DIAGNOSIS — I5022 Chronic systolic (congestive) heart failure: Secondary | ICD-10-CM | POA: Diagnosis not present

## 2017-12-15 MED ORDER — ALBUTEROL SULFATE HFA 108 (90 BASE) MCG/ACT IN AERS: 1.0000 | INHALATION_SPRAY | Freq: Four times a day (QID) | RESPIRATORY_TRACT | 2 refills | Status: DC | PRN

## 2017-12-15 NOTE — Patient Instructions (Addendum)
Medication Instructions:  Please take extra 20 mg of Furosemide for 3 days.then return to normal dose. You may use an Albuterol inhaler as needed 1 to 2 puffs every 6 hours for shortness of breath. Continue all other medications as listed.  Testing/Procedures: Your physician has requested that you have an echocardiogram. Echocardiography is a painless test that uses sound waves to create images of your heart. It provides your doctor with information about the size and shape of your heart and how well your heart's chambers and valves are working. This procedure takes approximately one hour. There are no restrictions for this procedure.  Follow-Up: Follow up in 1 month with Dr Marlou Porch.  If you need a refill on your cardiac medications before your next appointment, please call your pharmacy.  Thank you for choosing [facility]!!

## 2017-12-15 NOTE — Progress Notes (Signed)
[place]. [address], [address] [place],   [address] Phone: [phone] Fax:  [phone]  Date:  12/15/2017   ID:  Melissa Novak, DOB 08/17/1934, MRN [id number]  PCP:  Lajean Manes, MD   History of Present Illness: Melissa Novak is a 84 y.o. female here for follow-up, pacemaker, edema, hypertension, obesity. Previously had pauses up to 6 seconds and had pacemaker placed. Upgrade pacer, Bi-v. EF40%.   Melanoma - right arm. Dr. Alen Blew. Obviously had a rough time with this.  Feeling better.  Weight- She is going to the [facility], physical trainer. She states that Medicare will pay for 8 weeks of this. She is trying to avoid carbohydrates.  Hypertension-she is having intermittent swelling but this seems to be improved on lower dose amlodipine. On losartan as well. Dr. Felipa Eth has discussed this with her. Her blood pressure is better today.  12/15/17-continues to have shortness of breath despite biventricular pacer lead upgrade.  We are going to recheck an echocardiogram.  Give her additional Lasix.  She may have a degree of bronchoconstriction as well.  Weight has been stable.  No chest pain.  Wt Readings from Last 3 Encounters:  12/15/17 208 lb 3.2 oz (94.4 kg)  10/20/17 209 lb 3.2 oz (94.9 kg)  10/19/17 208 lb 12.8 oz (94.7 kg)     Past Medical History:  Diagnosis Date  . Cancer ([place])    melanoma right forearm  . Complete heart block ([place])   . Coronary artery disease CARDIOLOGIST- DR Gillian Shields-  LAST VISIT THIS WEEK--  WILL REQUEST NOTE, STRESS TEST    PT DENIES S & S  . Cough    pt states has had cough  5-7 weeks. pcp aware  . Diverticulosis   . History of pulmonary embolism 1970'S  . History of shingles 2004 --  EYES  , NO RESIDUAL PAIN  . Hypertension   . LBBB (left bundle branch block)   . Malignant melanoma of right forearm ([place]) 03/17/2016  . Pacemaker 4/13   DDD St Jude  . PONV (postoperative nausea and vomiting)    pt states no n/v w 4/13 surgery  .  Prolapse of anterior vaginal wall     Past Surgical History:  Procedure Laterality Date  . BIV UPGRADE N/A 07/17/2017   Procedure: BiV Upgrade;  Surgeon: Evans Lance, MD;  Location: [place] [facility];  Service: Cardiovascular;  Laterality: N/A;  . BREAST SURGERY     biopsy  . CARDIOVASCULAR STRESS TEST  02-14-2012  DR Idalie Canto   LOW RISK NUCLEAR TEST/ MILD FIXED DEFECT ALONG THE DISTAL ANTERIOR SEPTAL WALL CONSTISTENT WITH LBBB/ NO ISCHEMIA   . CATARACT EXTRACTION W/ INTRAOCULAR LENS  IMPLANT, BILATERAL    . CHOLECYSTECTOMY  1967  . INSERT / REPLACE / REMOVE PACEMAKER  4/13   ddd St Jude pacer,d/t symptomatic bradycardia w intermittent complete heart block.  Inserted By Dr. Crissie Sickles 4/13  . MELANOMA EXCISION Right 03/17/2016   Procedure: WIDE EXCISION MELANOMA RIGHT FOREARM;  Surgeon: Fanny Skates, MD;  Location: [place];  Service: General;  Laterality: Right;  . PERMANENT PACEMAKER INSERTION N/A 02/27/2012   Procedure: PERMANENT PACEMAKER INSERTION;  Surgeon: Evans Lance, MD;  Location: [facility] CATH LAB;  Service: Cardiovascular;  Laterality: N/A;  . 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 PUBOVAGINAL SLING  12/07/2012   Procedure: Gaynelle Arabian;  Surgeon: Ailene Rud, MD;  Location: [facility];  Service: Urology;  Laterality: N/A;  solyx sling   . TRANSTHORACIC ECHOCARDIOGRAM  02-15-2012  DR Jaylend Reiland   NORMAL EF 60-65%/ MILD AORTIC SCLEROSIS, NO STENOSIS, GRADE I DIASTOLIC DYSFUNCTION  . VAGINAL HYSTERECTOMY  1974   STILL HAS OVARIES    Current Outpatient Medications  Medication Sig Dispense Refill  . amLODipine (NORVASC) 2.5 MG tablet Take 2.5 mg by mouth daily.    . Ascorbic Acid (VITAMIN C) 1000 MG tablet Take 1,000 mg by mouth daily.    Marland Kitchen aspirin 81 MG  tablet Take 81 mg by mouth every evening.     Marland Kitchen atorvastatin (LIPITOR) 10 MG tablet Take 10 mg by mouth daily.     . calcium carbonate (OS-CAL) 600 MG TABS tablet Take 600 mg by mouth daily.     . Carboxymethylcellulose Sodium (THERATEARS OP) Apply 1 drop to eye daily as needed (dry eyes).    . furosemide (LASIX) 40 MG tablet Take 1.5 tablets (60 mg total) by mouth daily. (Patient taking differently: Take 40 mg by mouth daily. ) 45 tablet 6  . GARLIC PO Take 1 tablet by mouth daily.     Marland Kitchen HYDROcodone-acetaminophen (NORCO) 5-325 MG tablet Take 1-2 tablets by mouth every 6 (six) hours as needed for moderate pain or severe pain. 30 tablet 0  . ibuprofen (ADVIL,MOTRIN) 200 MG tablet Take 400 mg by mouth daily as needed for moderate pain.    Marland Kitchen losartan (COZAAR) 50 MG tablet Take 50 mg by mouth 2 (two) times daily.    . metFORMIN (GLUCOPHAGE-XR) 500 MG 24 hr tablet Take 500 mg by mouth daily with supper.  3  . Misc Natural Products (OSTEO BI-FLEX TRIPLE STRENGTH) TABS Take 1 tablet by mouth at bedtime.    . Multiple Vitamin (MULTIVITAMIN WITH MINERALS) TABS Take 1 tablet by mouth daily.    . Multiple Vitamins-Minerals (ZINC PO) Take 1 tablet by mouth daily.     Marland Kitchen MYRBETRIQ 50 MG TB24 tablet Take 50 mg by mouth daily.  5  . saccharomyces boulardii (FLORASTOR) 250 MG capsule Take 250 mg by mouth at bedtime.    . valACYclovir (VALTREX) 1000 MG tablet Take 1,000 mg by mouth daily.     No current facility-administered medications for this visit.     Allergies:    Allergies  Allergen Reactions  . Sulfa Antibiotics Anaphylaxis  . Percocet [Oxycodone-Acetaminophen] Rash  . Tramadol Hives, Nausea Only and Other (See Comments)    Blurred vision  . Benadryl [Diphenhydramine Hcl] Rash and Other (See Comments)    BLURRED VISION  . Cephalexin Rash  . Codeine Rash    Hydrocodone is the only codeine product that is ok to take  . Demerol Nausea Only and Rash  . Dilaudid [Hydromorphone Hcl] Nausea And  Vomiting  . Dimenhydrinate Rash and Other (See Comments)    BLURRED VISION  . Meclizine Hcl Rash and Other (See Comments)    BLURRED VISION  . Penicillins Rash and Other (See Comments)    FEVER Has patient had a PCN reaction causing immediate rash, facial/tongue/throat swelling, SOB or lightheadedness with hypotension: Yes Has patient had a PCN reaction causing severe rash involving mucus membranes or skin necrosis: No Has patient had a PCN reaction that required hospitalization: Yes Has patient had a PCN reaction occurring within the last 10  years: No If all of the above answers are "NO", then may proceed with Cephalosporin use.   Marland Kitchen Pentazocine Lactate Rash and Other (See Comments)    FEVER    Social History:  The patient  reports that she has quit smoking. she has never used smokeless tobacco. She reports that she drinks alcohol. She reports that she does not use drugs.   ROS:  Please see the history of present illness.   No fevers, no syncope, no orthopnea, no PND   PHYSICAL EXAM: VS:  BP 122/70   Pulse 100   Ht 5\' 2"  (1.575 m)   Wt 208 lb 3.2 oz (94.4 kg)   SpO2 97%   BMI 38.08 kg/m  Well nourished, well developed, in no acute distress  HEENT: normal  Neck: no JVD  Cardiac:  normal S1, S2; RRR; no murmur  Lungs:  Mild crackles B lobes.   Abd: soft, nontender, no hepatomegaly  Ext: improvededema  Skin: warm and dry right arm scar noted.  Neuro: no focal abnormalities noted  EKG:  11/25/14, heart rate 90 Sinus rhythm, first-degree AV block, left bundle branch block Sinus rhythm, 86, left anterior fascicular block, intraventricular conduction delay.     ASSESSMENT AND PLAN:  1. SOB -EF was 35-40%, by V ICD upgrade in August 2018.  She is still feeling shortness of breath when walking from her car to the office for instance.  Continues to have crackles in her lungs.  She says it is worse in the wintertime than in the summer.  2 things could be happening, but she could be  having a degree of bronchoconstriction because of the cold air, we will give her albuterol inhaler for this.  She had tried this once before with bronchitis.  The other thing is she could be overloaded with fluid and therefore we will give her an extra Lasix for 3 days.  Continue to encourage limiting of sodium.  She does admit to enjoying chips and salsa at [facility]. 2. Dilated cardiomyopathy-we will repeat echocardiogram.  I am curious to see if by IV insertion has helped her with her ejection fraction.  Symptomatically, she is still feeling short of breath. 3. Left bundle branch block-no changes made. Interventricular conduction delay. Pacemaker for backup.  Biventricular 4. HTN - controlled. Excellent BP.  5. Melanoma - right arm. Dr. Alen Blew.  Dr. Dalbert Batman performed surgery. 6. Obesity- continue to encourage weight loss 7. Pacemaker-CHB - functioning well. Dr. Lovena Le, [place], Bi-V upgrade 8. One-year follow-up  Signed, Candee Furbish, MD [facility]  12/15/2017 3:28 PM

## 2017-12-18 ENCOUNTER — Telehealth: Payer: Self-pay | Admitting: Cardiology

## 2017-12-18 NOTE — Telephone Encounter (Signed)
Left message for pt of recommendation and to c/b if questions.

## 2017-12-18 NOTE — Telephone Encounter (Signed)
New message   Patient says she has questions about her doc appt she had on Friday 12/15/2017 Patient does not want to disclose the issue please call

## 2017-12-18 NOTE — Telephone Encounter (Signed)
Spoke pt who is reporting she is feeling some better, has more energy. She does have a dry hacking cough at times and coughed up a "big plug of phlegm" that was clear in color.  Denies s/s of cold.  She has not been using her inhaler but has taken Furosemide as ordered.  She is scheduled for a 2 D Echo 2/7 and has a return f/u appt scheduled with Dr Marlou Porch in March.  Dr Gillian Shields aware of the above information.  sugguests she use Musinex as needed for cough.

## 2017-12-19 ENCOUNTER — Ambulatory Visit: Payer: Medicare Other

## 2017-12-22 ENCOUNTER — Ambulatory Visit: Payer: Medicare Other

## 2017-12-28 ENCOUNTER — Ambulatory Visit (HOSPITAL_COMMUNITY): Payer: Medicare Other | Attending: Cardiovascular Disease

## 2017-12-28 ENCOUNTER — Other Ambulatory Visit: Payer: Self-pay

## 2017-12-28 DIAGNOSIS — Z86711 Personal history of pulmonary embolism: Secondary | ICD-10-CM | POA: Insufficient documentation

## 2017-12-28 DIAGNOSIS — I5022 Chronic systolic (congestive) heart failure: Secondary | ICD-10-CM | POA: Diagnosis not present

## 2017-12-28 DIAGNOSIS — I2510 Atherosclerotic heart disease of native coronary artery without angina pectoris: Secondary | ICD-10-CM | POA: Diagnosis not present

## 2017-12-28 DIAGNOSIS — R0602 Shortness of breath: Secondary | ICD-10-CM | POA: Diagnosis not present

## 2017-12-28 DIAGNOSIS — I110 Hypertensive heart disease with heart failure: Secondary | ICD-10-CM | POA: Diagnosis not present

## 2017-12-28 DIAGNOSIS — I10 Essential (primary) hypertension: Secondary | ICD-10-CM | POA: Diagnosis not present

## 2017-12-29 ENCOUNTER — Telehealth: Payer: Self-pay | Admitting: Cardiology

## 2017-12-29 NOTE — Telephone Encounter (Signed)
Pt aware of results -  Notes recorded by Jerline Pain, MD on 12/29/2017 at 3:38 PM EST Normal EF, reassuring echo.  Candee Furbish, MD

## 2017-12-29 NOTE — Telephone Encounter (Signed)
New message ° ° °Please call with echo results °

## 2018-01-01 DIAGNOSIS — L304 Erythema intertrigo: Secondary | ICD-10-CM | POA: Diagnosis not present

## 2018-01-01 DIAGNOSIS — Z85820 Personal history of malignant melanoma of skin: Secondary | ICD-10-CM | POA: Diagnosis not present

## 2018-01-22 ENCOUNTER — Ambulatory Visit: Payer: Medicare Other | Admitting: *Deleted

## 2018-01-23 ENCOUNTER — Telehealth: Payer: Self-pay | Admitting: Cardiology

## 2018-01-23 NOTE — Telephone Encounter (Signed)
Spoke with pt and reminded pt of remote transmission that is due today. Pt verbalized understanding.   

## 2018-01-25 ENCOUNTER — Encounter: Payer: Self-pay | Admitting: Cardiology

## 2018-01-25 ENCOUNTER — Ambulatory Visit: Payer: Medicare Other

## 2018-01-25 NOTE — Progress Notes (Signed)
Not received  

## 2018-01-30 ENCOUNTER — Telehealth: Payer: Self-pay | Admitting: Cardiology

## 2018-01-30 NOTE — Telephone Encounter (Signed)
Spoke with pt who states she can not do a morning appt.  Offered multiple appointment however she either already had another appt somewhere else or it was too early in the day for her.  Scheduled f/u for 5/10 at 3:20 pm.

## 2018-01-30 NOTE — Telephone Encounter (Signed)
New message    Please call this afternoon, she wants you to squeeze her in, she cancelled her 940a appt for 3/14 , I offered her appointment for 4/4 and she refused. Offered appointment with APP and patient refused, said she wants to speak with Pam.

## 2018-02-01 ENCOUNTER — Ambulatory Visit: Payer: Medicare Other | Admitting: Cardiology

## 2018-02-12 ENCOUNTER — Ambulatory Visit: Payer: Medicare Other

## 2018-02-19 ENCOUNTER — Ambulatory Visit (INDEPENDENT_AMBULATORY_CARE_PROVIDER_SITE_OTHER): Payer: Medicare Other | Admitting: *Deleted

## 2018-02-19 DIAGNOSIS — I442 Atrioventricular block, complete: Secondary | ICD-10-CM | POA: Diagnosis not present

## 2018-02-20 ENCOUNTER — Encounter: Payer: Self-pay | Admitting: Cardiology

## 2018-02-20 NOTE — Progress Notes (Signed)
Remote pacemaker transmission.   

## 2018-03-05 DIAGNOSIS — Z950 Presence of cardiac pacemaker: Secondary | ICD-10-CM | POA: Diagnosis not present

## 2018-03-05 DIAGNOSIS — I498 Other specified cardiac arrhythmias: Secondary | ICD-10-CM | POA: Diagnosis not present

## 2018-03-05 DIAGNOSIS — E119 Type 2 diabetes mellitus without complications: Secondary | ICD-10-CM | POA: Diagnosis not present

## 2018-03-05 DIAGNOSIS — I10 Essential (primary) hypertension: Secondary | ICD-10-CM | POA: Diagnosis not present

## 2018-03-05 DIAGNOSIS — C4361 Malignant melanoma of right upper limb, including shoulder: Secondary | ICD-10-CM | POA: Diagnosis not present

## 2018-03-06 LAB — CUP PACEART REMOTE DEVICE CHECK
Battery Remaining Longevity: 61 mo
Battery Remaining Percentage: 95.5 %
Battery Voltage: 2.96 V
Brady Statistic AS VS Percent: 1 %
Date Time Interrogation Session: 20190401190126
Implantable Lead Implant Date: 20130408
Implantable Lead Location: 753859
Implantable Pulse Generator Implant Date: 20180827
Lead Channel Impedance Value: 360 Ohm
Lead Channel Pacing Threshold Amplitude: 0.5 V
Lead Channel Pacing Threshold Amplitude: 3.125 V
Lead Channel Pacing Threshold Pulse Width: 0.4 ms
Lead Channel Pacing Threshold Pulse Width: 0.5 ms
Lead Channel Pacing Threshold Pulse Width: 0.8 ms
Lead Channel Sensing Intrinsic Amplitude: 3.8 mV
Lead Channel Setting Pacing Amplitude: 1.875
Lead Channel Setting Pacing Amplitude: 2 V
Lead Channel Setting Pacing Amplitude: 4.125
Lead Channel Setting Pacing Pulse Width: 0.5 ms
Lead Channel Setting Pacing Pulse Width: 0.8 ms
Lead Channel Setting Sensing Sensitivity: 5 mV
MDC IDC LEAD IMPLANT DT: 20130408
MDC IDC LEAD IMPLANT DT: 20180827
MDC IDC LEAD LOCATION: 753858
MDC IDC LEAD LOCATION: 753860
MDC IDC MSMT LEADCHNL LV IMPEDANCE VALUE: 850 Ohm
MDC IDC MSMT LEADCHNL RA PACING THRESHOLD AMPLITUDE: 0.875 V
MDC IDC MSMT LEADCHNL RV IMPEDANCE VALUE: 440 Ohm
MDC IDC PG SERIAL: 7946848
MDC IDC STAT BRADY AP VP PERCENT: 1 %
MDC IDC STAT BRADY AP VS PERCENT: 1 %
MDC IDC STAT BRADY AS VP PERCENT: 99 %
MDC IDC STAT BRADY RA PERCENT PACED: 1 %

## 2018-03-15 DIAGNOSIS — E119 Type 2 diabetes mellitus without complications: Secondary | ICD-10-CM | POA: Diagnosis not present

## 2018-03-15 DIAGNOSIS — Z7984 Long term (current) use of oral hypoglycemic drugs: Secondary | ICD-10-CM | POA: Diagnosis not present

## 2018-03-15 DIAGNOSIS — Z6839 Body mass index (BMI) 39.0-39.9, adult: Secondary | ICD-10-CM | POA: Diagnosis not present

## 2018-03-15 DIAGNOSIS — R600 Localized edema: Secondary | ICD-10-CM | POA: Diagnosis not present

## 2018-03-15 DIAGNOSIS — Z79899 Other long term (current) drug therapy: Secondary | ICD-10-CM | POA: Diagnosis not present

## 2018-03-15 DIAGNOSIS — Z7189 Other specified counseling: Secondary | ICD-10-CM | POA: Diagnosis not present

## 2018-03-15 DIAGNOSIS — I10 Essential (primary) hypertension: Secondary | ICD-10-CM | POA: Diagnosis not present

## 2018-03-15 DIAGNOSIS — E7800 Pure hypercholesterolemia, unspecified: Secondary | ICD-10-CM | POA: Diagnosis not present

## 2018-03-29 ENCOUNTER — Encounter: Payer: Self-pay | Admitting: Cardiology

## 2018-03-30 ENCOUNTER — Ambulatory Visit (INDEPENDENT_AMBULATORY_CARE_PROVIDER_SITE_OTHER): Payer: Medicare Other | Admitting: Cardiology

## 2018-03-30 ENCOUNTER — Encounter: Payer: Self-pay | Admitting: Cardiology

## 2018-03-30 VITALS — BP 118/78 | HR 105 | Ht 62.0 in | Wt 209.4 lb

## 2018-03-30 DIAGNOSIS — Z950 Presence of cardiac pacemaker: Secondary | ICD-10-CM | POA: Diagnosis not present

## 2018-03-30 DIAGNOSIS — R0602 Shortness of breath: Secondary | ICD-10-CM

## 2018-03-30 DIAGNOSIS — I5022 Chronic systolic (congestive) heart failure: Secondary | ICD-10-CM

## 2018-03-30 DIAGNOSIS — I10 Essential (primary) hypertension: Secondary | ICD-10-CM

## 2018-03-30 MED ORDER — FUROSEMIDE 40 MG PO TABS: 40.0000 mg | ORAL_TABLET | Freq: Every day | ORAL | 3 refills | Status: DC

## 2018-03-30 NOTE — Progress Notes (Addendum)
[place]. [address], [address], [place]  [address] Phone: [phone] Fax:  [phone]  Date:  03/30/2018   ID:  Melissa Novak, DOB Nov 05, 1934, MRN [id number]  PCP:  Lajean Manes, MD   History of Present Illness: Melissa Novak is a 84 y.o. female here for follow-up, pacemaker, edema, hypertension, obesity. Previously had pauses up to 6 seconds and had pacemaker placed. Upgrade pacer, Bi-v. EF40%.   Melanoma - right arm. Dr. Alen Blew. Obviously had a rough time with this.  Feeling better.  Weight- She is going to the [facility], physical trainer. She states that Medicare will pay for 8 weeks of this. She is trying to avoid carbohydrates.  Hypertension-she is having intermittent swelling but this seems to be improved on lower dose amlodipine. On losartan as well. Dr. Felipa Eth has discussed this with her. Her blood pressure is better today.  12/15/17-continues to have shortness of breath despite biventricular pacer lead upgrade.  We are going to recheck an echocardiogram.  Give her additional Lasix.  She may have a degree of bronchoconstriction as well.  Weight has been stable.  No chest pain.  5/101/9 - bad cough for 3 weeks. Dr. Felipa Eth heard crackles. Takes Delsym. Mucus green production.  She has a cat.  Enjoys playing bridge.  Sometimes feels lonely, isolated.  No chest pain.  Occasional shortness of breath.  Wt Readings from Last 3 Encounters:  03/30/18 209 lb 6.4 oz (95 kg)  12/15/17 208 lb 3.2 oz (94.4 kg)  10/20/17 209 lb 3.2 oz (94.9 kg)     Past Medical History:  Diagnosis Date  . Anxiety   . Benign positional vertigo   . Cancer ([place])    melanoma right forearm  . Complete heart block ([place])   . Coronary artery disease CARDIOLOGIST- DR Gillian Shields-  LAST VISIT THIS WEEK--  WILL REQUEST NOTE, STRESS TEST    PT DENIES S & S  . Cough    pt states has had cough  5-7 weeks. pcp aware  . Diabetes ([place]) 08/2016  . Diverticulosis   . History of pulmonary embolism  1970'S  . History of shingles 2004 --  EYES  , NO RESIDUAL PAIN  . Hypertension   . LBBB (left bundle branch block)   . Malignant melanoma of right forearm ([place]) 03/17/2016  . Melanoma of forearm, right ([place]) 01/2016  . Obesity   . Pacemaker 4/13   DDD St Jude  . PONV (postoperative nausea and vomiting)    pt states no n/v w 4/13 surgery  . Prolapse of anterior vaginal wall   . Pulmonary embolism ([place]) 1980  . Vitamin D deficiency     Past Surgical History:  Procedure Laterality Date  . BIV UPGRADE N/A 07/17/2017   Procedure: BiV Upgrade;  Surgeon: Evans Lance, MD;  Location: [place] [facility];  Service: Cardiovascular;  Laterality: N/A;  . BREAST SURGERY     biopsy  . CARDIOVASCULAR STRESS TEST  02-14-2012  DR Maleik Vanderzee   LOW RISK NUCLEAR TEST/ MILD FIXED DEFECT ALONG THE DISTAL ANTERIOR SEPTAL WALL CONSTISTENT WITH LBBB/ NO ISCHEMIA   . CATARACT EXTRACTION W/ INTRAOCULAR LENS  IMPLANT, BILATERAL    . CHOLECYSTECTOMY  1967  . INSERT / REPLACE / REMOVE PACEMAKER  4/13   ddd St Jude pacer,d/t symptomatic bradycardia w intermittent complete heart block.  Inserted By Dr. Crissie Sickles 4/13  . MELANOMA EXCISION Right 03/17/2016   Procedure: WIDE EXCISION MELANOMA RIGHT  FOREARM;  Surgeon: Fanny Skates, MD;  Location: [place];  Service: General;  Laterality: Right;  . PERMANENT PACEMAKER INSERTION N/A 02/27/2012   Procedure: PERMANENT PACEMAKER INSERTION;  Surgeon: Evans Lance, MD;  Location: [facility] CATH LAB;  Service: Cardiovascular;  Laterality: N/A;  . 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 PUBOVAGINAL SLING  12/07/2012   Procedure: Gaynelle Arabian;  Surgeon: Ailene Rud, MD;  Location: [facility];  Service: Urology;  Laterality: N/A;  solyx sling   . TRANSTHORACIC  ECHOCARDIOGRAM  02-15-2012  DR Jamonica Schoff   NORMAL EF 60-65%/ MILD AORTIC SCLEROSIS, NO STENOSIS, GRADE I DIASTOLIC DYSFUNCTION  . VAGINAL HYSTERECTOMY  1974   STILL HAS OVARIES    Current Outpatient Medications  Medication Sig Dispense Refill  . amLODipine (NORVASC) 2.5 MG tablet Take 2.5 mg by mouth daily.    . Ascorbic Acid (VITAMIN C) 1000 MG tablet Take 1,000 mg by mouth daily.    Marland Kitchen aspirin 81 MG tablet Take 81 mg by mouth every evening.     Marland Kitchen atorvastatin (LIPITOR) 10 MG tablet Take 10 mg by mouth daily.     . calcium carbonate (OS-CAL) 600 MG TABS tablet Take 600 mg by mouth daily.     . furosemide (LASIX) 40 MG tablet Take 1 tablet (40 mg total) by mouth daily. 90 tablet 3  . GARLIC PO Take 1 tablet by mouth daily.     Marland Kitchen HYDROcodone-acetaminophen (NORCO) 5-325 MG tablet Take 1-2 tablets by mouth every 6 (six) hours as needed for moderate pain or severe pain. 30 tablet 0  . ibuprofen (ADVIL,MOTRIN) 200 MG tablet Take 400 mg by mouth daily as needed for moderate pain.    Marland Kitchen losartan (COZAAR) 50 MG tablet Take 50 mg by mouth 2 (two) times daily.    . metFORMIN (GLUCOPHAGE-XR) 500 MG 24 hr tablet Take 500 mg by mouth daily with supper.  3  . Misc Natural Products (OSTEO BI-FLEX TRIPLE STRENGTH) TABS Take 1 tablet by mouth at bedtime.    . Multiple Vitamin (MULTIVITAMIN WITH MINERALS) TABS Take 1 tablet by mouth daily.    Marland Kitchen saccharomyces boulardii (FLORASTOR) 250 MG capsule Take 250 mg by mouth at bedtime.    . valACYclovir (VALTREX) 1000 MG tablet Take 1,000 mg by mouth daily.     No current facility-administered medications for this visit.     Allergies:    Allergies  Allergen Reactions  . Sulfa Antibiotics Anaphylaxis  . Percocet [Oxycodone-Acetaminophen] Rash  . Tramadol Hives, Nausea Only and Other (See Comments)    Blurred vision  . Benadryl [Diphenhydramine Hcl] Rash and Other (See Comments)    BLURRED VISION  . Cephalexin Rash  . Codeine Rash    Hydrocodone is the only  codeine product that is ok to take  . Demerol Nausea Only and Rash  . Dilaudid [Hydromorphone Hcl] Nausea And Vomiting  . Dimenhydrinate Rash and Other (See Comments)    BLURRED VISION  . Meclizine Hcl Rash and Other (See Comments)    BLURRED VISION  . Penicillins Rash and Other (See Comments)    FEVER Has patient had a PCN reaction causing immediate rash, facial/tongue/throat swelling, SOB or lightheadedness with hypotension: Yes Has patient had a PCN reaction causing severe rash involving mucus membranes or skin necrosis: No  Has patient had a PCN reaction that required hospitalization: Yes Has patient had a PCN reaction occurring within the last 10 years: No If all of the above answers are "NO", then may proceed with Cephalosporin use.   Marland Kitchen Pentazocine Lactate Rash and Other (See Comments)    FEVER    Social History:  The patient  reports that she has quit smoking. She has never used smokeless tobacco. She reports that she drinks alcohol. She reports that she does not use drugs.   ROS:  Please see the history of present illness.   Does not like to get around too much.  Coughing.  PHYSICAL EXAM: VS:  BP 118/78   Pulse (!) 105   Ht 5\' 2"  (1.575 m)   Wt 209 lb 6.4 oz (95 kg)   SpO2 91%   BMI 38.30 kg/m  GEN: Well nourished, well developed, in no acute distress , obese HEENT: normal  Neck: no JVD, carotid bruits, or masses Cardiac: RRR; no murmurs, rubs, or gallops,no edema  Respiratory: Crackles at bases, mild wheeze, normal work of breathing GI: soft, nontender, nondistended, + BS MS: no deformity or atrophy  Skin: warm and dry, no rash Neuro:  Alert and Oriented x 3, Strength and sensation are intact Psych: euthymic mood, full affect   EKG:  11/25/14, heart rate 90 Sinus rhythm, first-degree AV block, left bundle branch block Sinus rhythm, 86, left anterior fascicular block, intraventricular conduction delay.     ASSESSMENT AND PLAN:  1. Dyspnea, dilated cardiomyopathy-EF  was 35-40%, BiV ICD upgrade in August 2018, currently EF on 12/28/2017 is normal excellent.  She is still feeling shortness of breath when walking from her car to the office for instance.  Continues to have crackles in her lungs.  She says it is worse in the wintertime than in the summer.  2 things could be happening, but she could be having a degree of bronchoconstriction because of the cold air, we will give her albuterol inhaler for this.  She had tried this once before with bronchitis.  The other thing is she could be overloaded with fluid and therefore we will give her an extra Lasix for 3 days.  Continue to encourage limiting of sodium.  She does admit to enjoying chips and salsa at [facility]. 2. Dilated cardiomyopathy-Bi-IV insertion has helped her with her ejection fraction, now normal.  Symptomatically, she is still feeling short of breath.  Some of this could be lung related but I want to make sure that she does not have any pulmonary edema.  Increasing Lasix to 40 mg once a day. 3. Left bundle branch block-no changes made. Interventricular conduction delay. Pacemaker for backup.  Biventricular 4. HTN - controlled. Excellent BP.  No changes made. 5. Melanoma - right arm. Dr. Alen Blew.  Dr. Dalbert Batman performed surgery.  No discussion today. 6. Obesity- continue to encourage weight loss, no changes 7. Pacemaker-CHB - functioning well. Dr. Lovena Le, [place], Bi-V upgrade 8. DNR (Dr. Felipa Eth note reviewed) 9. One-year follow-up  Signed, Candee Furbish, MD [facility]  03/30/2018 4:32 PM

## 2018-03-30 NOTE — Patient Instructions (Signed)
Medication Instructions:  Please increase your Furosemide to 40 mg a day. Continue all other medication as listed.  Follow-Up: Follow up in 6 months with Cecilie Kicks, NP.  You will receive a letter in the mail 2 months before you are due.  Please call us when you receive this letter to schedule your follow up appointment.  Follow up in 1 year with Dr. Marlou Porch.  You will receive a letter in the mail 2 months before you are due.  Please call us when you receive this letter to schedule your follow up appointment.  If you need a refill on your cardiac medications before your next appointment, please call your pharmacy.  Thank you for choosing [facility]!!

## 2018-04-04 ENCOUNTER — Other Ambulatory Visit: Payer: Self-pay | Admitting: *Deleted

## 2018-04-04 MED ORDER — FUROSEMIDE 40 MG PO TABS: 40.0000 mg | ORAL_TABLET | Freq: Every day | ORAL | 3 refills | Status: DC

## 2018-04-13 ENCOUNTER — Telehealth: Payer: Self-pay | Admitting: Cardiology

## 2018-04-13 NOTE — Telephone Encounter (Signed)
Spoke with pt who reports she is continuing to have a rattly cough despite increasing Lasix to 40 mg.  Pt reports having some production of green sputum on occasion.  Advised pt she should be seeing improvement in cough if r/t retention of fluid and to contact PCP for further evaluation.  Aware I will forward this information to Dr Marlou Porch for his knowledge.

## 2018-04-13 NOTE — Telephone Encounter (Signed)
New Message    Pt c/o medication issue:  1. Name of Medication: furosemide (LASIX) 40 MG tablet  2. How are you currently taking this medication (dosage and times per day)?   3. Are you having a reaction (difficulty breathing--STAT)?   4. What is your medication issue? Patient is calling because her lasix was increased at her last appointment but she still has a rattling cough. Please call.

## 2018-05-08 ENCOUNTER — Telehealth: Payer: Self-pay | Admitting: Cardiology

## 2018-05-08 NOTE — Telephone Encounter (Signed)
New Message     1. Has your device fired? no  2. Is you device beeping? no  3. Are you experiencing draining or swelling at device site? no  4. Are you calling to see if we received your device transmission? no  5. Have you passed out? no  Patient is wanting to talk about being away from her device for a week. She wants to make sure that its okay. Please call to discuss.    Please route to [place]

## 2018-05-08 NOTE — Telephone Encounter (Signed)
[place]. I will reschedule her remote check till 7/8. Patient verbalized understanding

## 2018-05-28 ENCOUNTER — Telehealth: Payer: Self-pay | Admitting: Cardiology

## 2018-05-28 ENCOUNTER — Ambulatory Visit (INDEPENDENT_AMBULATORY_CARE_PROVIDER_SITE_OTHER): Payer: Medicare Other | Admitting: *Deleted

## 2018-05-28 DIAGNOSIS — I442 Atrioventricular block, complete: Secondary | ICD-10-CM

## 2018-05-28 NOTE — Telephone Encounter (Signed)
Spoke with pt and reminded pt of remote transmission that is due today. Pt verbalized understanding.   

## 2018-05-29 NOTE — Progress Notes (Signed)
Remote pacemaker transmission.   

## 2018-05-31 ENCOUNTER — Telehealth: Payer: Self-pay | Admitting: Internal Medicine

## 2018-05-31 ENCOUNTER — Ambulatory Visit
Admission: RE | Admit: 2018-05-31 | Discharge: 2018-05-31 | Disposition: A | Discharge status: Home / Self Care | Payer: Medicare Other | Source: Ambulatory Visit | Attending: Geriatric Medicine | Admitting: Geriatric Medicine

## 2018-05-31 ENCOUNTER — Other Ambulatory Visit: Payer: Self-pay | Admitting: Geriatric Medicine

## 2018-05-31 DIAGNOSIS — R0600 Dyspnea, unspecified: Secondary | ICD-10-CM

## 2018-05-31 DIAGNOSIS — Z6839 Body mass index (BMI) 39.0-39.9, adult: Secondary | ICD-10-CM | POA: Diagnosis not present

## 2018-05-31 DIAGNOSIS — R0602 Shortness of breath: Secondary | ICD-10-CM | POA: Diagnosis not present

## 2018-05-31 DIAGNOSIS — J984 Other disorders of lung: Secondary | ICD-10-CM | POA: Diagnosis not present

## 2018-05-31 DIAGNOSIS — I5042 Chronic combined systolic (congestive) and diastolic (congestive) heart failure: Secondary | ICD-10-CM | POA: Diagnosis not present

## 2018-05-31 DIAGNOSIS — I10 Essential (primary) hypertension: Secondary | ICD-10-CM | POA: Diagnosis not present

## 2018-05-31 NOTE — Telephone Encounter (Signed)
Remote transmission reviewed. Presenting rhythm: AsBp. <1% AT/AF burden, max dur. 66mins 10sec, last 05/25/18 - AT per EGMs. <1% Ap, >99%Bp. Stable thoracic impedance. Normal device function.  Called patient about remote transmission. Patient states that she saw Dr.Stoneking and told him about her ShOB. She states that Dr.Stoneking does not believe her ShOB is coming from her lungs so he Rx'd her Entresto. Patient wanted to know if her remote transmission could also explain her ShOB. I told patient that her remote was normal and that she just had an Echo which demonstrated an EF of 60-65%.  I asked patient if she had been indulging in a lot of sodium rich foods. Patient states that she quit cooking for herself about 2 years ago and has since been eating a lot of fast foods. I explained to her the importance of restricting her sodium intake and how her current diet could be playing a part in her ShOB. Patient verbalized understanding.  I encouraged patient to take the Entresto as Rx'd and to cut back on her salt and to call if she notices no relief in sx's. Patient verbalized understanding.

## 2018-05-31 NOTE — Telephone Encounter (Signed)
Pt calling.   Pt want to speak to you concerning her transmission she done few days ago

## 2018-06-04 ENCOUNTER — Telehealth: Payer: Self-pay | Admitting: Cardiology

## 2018-06-04 NOTE — Telephone Encounter (Signed)
New message   Patient calling to discuss the diagnosis of heart failure that was given to her by PCP. Requesting nurse call. Patient states she feels great and does not agree with diagnosis

## 2018-06-05 NOTE — Telephone Encounter (Signed)
Attempted to contact pt RE: dx of CHF and to answer her questions.  Left VM to c/b. Did receive office visit note from Dr Felipa Eth stating she appears compensated by exam on 05/31/18 and that he is switching her from amlodipine and losartan to Entresto 49/51 mg BID.  She is to f/u there in 1 month.

## 2018-06-05 NOTE — Telephone Encounter (Signed)
Follow up      Patient is calling back about her recent diagnosis from PCP. Please call to discuss

## 2018-06-06 NOTE — Telephone Encounter (Signed)
Follow up     Patient is callback in reference to her most recent diagnosis. She says she does not agree with PCP indicating that she has CHF. Please call to discuss.

## 2018-06-06 NOTE — Telephone Encounter (Signed)
Spoke with patient at length answering all questions r/t her DX of CHF.  She was concerned about changing from Amlodipine/Losartan to [facility] as ordered by Dr Felipa Eth.  She wanted to make sure this was OK with Dr Marlou Porch.  Advised pt he does frequently use this medication in pts with CHF and felt confident Dr Marlou Porch would be fine with it.  She is aware I will forward this information to him for his knowledge and review. She is aware that she carries the DX of CHF which is long term however she may not have any s/s if not having an acute episode.  Pt was grateful for the information and time I took with her to explain diagnosis and medications.

## 2018-06-07 NOTE — Telephone Encounter (Signed)
Sent report of last 2D echo to Dr Felipa Eth at the request of Dr Marlou Porch.

## 2018-06-07 NOTE — Telephone Encounter (Signed)
Her last EF was normal in 2/19 here. Perhaps Dr. Felipa Eth has another EF more recently that is lower. If so, ok with Entresto.  [employer]

## 2018-06-18 ENCOUNTER — Telehealth: Payer: Self-pay | Admitting: Oncology

## 2018-06-18 ENCOUNTER — Telehealth: Payer: Self-pay | Admitting: *Deleted

## 2018-06-18 NOTE — Telephone Encounter (Signed)
Returned patient's call DJ:MEQA change. patient states scheduling has already called her.

## 2018-06-18 NOTE — Telephone Encounter (Signed)
Patient called to reschedule  °

## 2018-06-23 LAB — CUP PACEART REMOTE DEVICE CHECK
Brady Statistic AP VP Percent: 1 %
Brady Statistic AS VP Percent: 99 %
Brady Statistic AS VS Percent: 1 %
Implantable Lead Implant Date: 20130408
Implantable Lead Location: 753858
Implantable Lead Location: 753860
Lead Channel Impedance Value: 360 Ohm
Lead Channel Impedance Value: 860 Ohm
Lead Channel Pacing Threshold Amplitude: 0.5 V
Lead Channel Pacing Threshold Amplitude: 1.75 V
Lead Channel Pacing Threshold Pulse Width: 0.4 ms
Lead Channel Pacing Threshold Pulse Width: 0.5 ms
Lead Channel Pacing Threshold Pulse Width: 0.8 ms
Lead Channel Setting Pacing Amplitude: 2.75 V
Lead Channel Setting Pacing Pulse Width: 0.5 ms
Lead Channel Setting Sensing Sensitivity: 5 mV
MDC IDC LEAD IMPLANT DT: 20130408
MDC IDC LEAD IMPLANT DT: 20180827
MDC IDC LEAD LOCATION: 753859
MDC IDC MSMT BATTERY REMAINING LONGEVITY: 69 mo
MDC IDC MSMT BATTERY REMAINING PERCENTAGE: 95.5 %
MDC IDC MSMT BATTERY VOLTAGE: 2.98 V
MDC IDC MSMT LEADCHNL RA PACING THRESHOLD AMPLITUDE: 0.875 V
MDC IDC MSMT LEADCHNL RA SENSING INTR AMPL: 3.7 mV
MDC IDC MSMT LEADCHNL RV IMPEDANCE VALUE: 400 Ohm
MDC IDC PG IMPLANT DT: 20180827
MDC IDC PG SERIAL: 7946848
MDC IDC SESS DTM: 20190701061522
MDC IDC SET LEADCHNL LV PACING PULSEWIDTH: 0.8 ms
MDC IDC SET LEADCHNL RA PACING AMPLITUDE: 1.875
MDC IDC SET LEADCHNL RV PACING AMPLITUDE: 2 V
MDC IDC STAT BRADY AP VS PERCENT: 1 %
MDC IDC STAT BRADY RA PERCENT PACED: 1 %

## 2018-07-13 DIAGNOSIS — Z6839 Body mass index (BMI) 39.0-39.9, adult: Secondary | ICD-10-CM | POA: Diagnosis not present

## 2018-07-13 DIAGNOSIS — R0609 Other forms of dyspnea: Secondary | ICD-10-CM | POA: Diagnosis not present

## 2018-07-13 DIAGNOSIS — E1169 Type 2 diabetes mellitus with other specified complication: Secondary | ICD-10-CM | POA: Diagnosis not present

## 2018-07-13 DIAGNOSIS — I10 Essential (primary) hypertension: Secondary | ICD-10-CM | POA: Diagnosis not present

## 2018-07-25 ENCOUNTER — Other Ambulatory Visit: Payer: Self-pay | Admitting: Geriatric Medicine

## 2018-07-25 DIAGNOSIS — R109 Unspecified abdominal pain: Secondary | ICD-10-CM

## 2018-07-25 DIAGNOSIS — I10 Essential (primary) hypertension: Secondary | ICD-10-CM | POA: Diagnosis not present

## 2018-07-26 ENCOUNTER — Ambulatory Visit
Admission: RE | Admit: 2018-07-26 | Discharge: 2018-07-26 | Disposition: A | Discharge status: Home / Self Care | Payer: Medicare Other | Source: Ambulatory Visit | Attending: Geriatric Medicine | Admitting: Geriatric Medicine

## 2018-07-26 DIAGNOSIS — K5730 Diverticulosis of large intestine without perforation or abscess without bleeding: Secondary | ICD-10-CM | POA: Diagnosis not present

## 2018-07-26 DIAGNOSIS — R109 Unspecified abdominal pain: Secondary | ICD-10-CM

## 2018-07-26 MED ORDER — IOPAMIDOL (ISOVUE-300) INJECTION 61%
125.0000 mL | Freq: Once | INTRAVENOUS | Status: AC | PRN
  Administered 2018-07-26: 125 mL via INTRAVENOUS

## 2018-07-30 ENCOUNTER — Institutional Professional Consult (permissible substitution): Payer: PRIVATE HEALTH INSURANCE | Admitting: Emergency Medicine

## 2018-08-02 ENCOUNTER — Institutional Professional Consult (permissible substitution): Payer: PRIVATE HEALTH INSURANCE | Admitting: Emergency Medicine

## 2018-08-27 ENCOUNTER — Ambulatory Visit (INDEPENDENT_AMBULATORY_CARE_PROVIDER_SITE_OTHER): Payer: Medicare Other | Admitting: *Deleted

## 2018-08-27 DIAGNOSIS — I442 Atrioventricular block, complete: Secondary | ICD-10-CM | POA: Diagnosis not present

## 2018-08-27 NOTE — Progress Notes (Signed)
Remote pacemaker transmission.   

## 2018-09-01 IMAGING — CT CT SHOULDER*R* W/O CM
3 series · 8 of 14 positions shown, 9 images · non-contrast
Comparison: CT chest 04/28/2016.

CLINICAL DATA: Right shoulder and arm pain for 3 months. Patient
with a history of melanoma of the right forearm and [id number]

EXAM:
CT OF THE RIGHT HUMERUS AND SHOULDER WITHOUT CONTRAST
TECHNIQUE: Multidetector CT imaging was performed according to the standard
protocol. Multiplanar CT image reconstructions were also generated.

[Series 5: shoulder soft · axial · 0.54mm/px · z∈[-159,-96]mm · 2 of 76 slices shown, 3 images]
[im 26/76  soft-tissue]
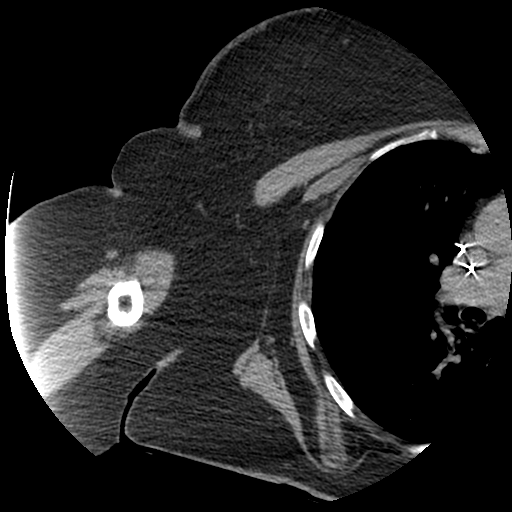
[im 26/76  bone]
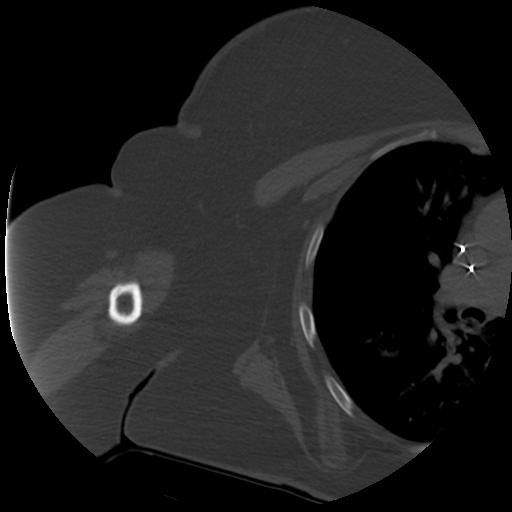
[im 51/76  bone]
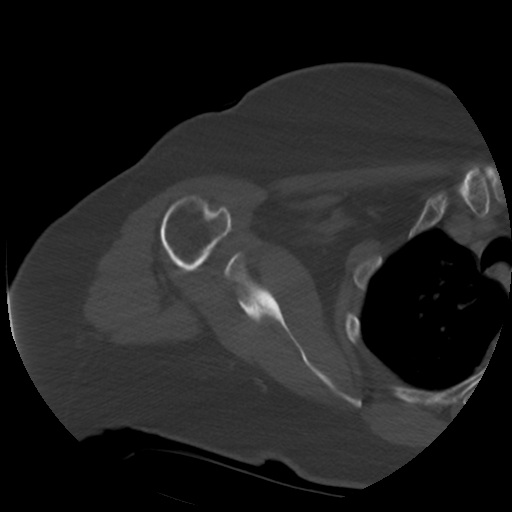

[Series 300: sag soft · oblique · 0.54mm/px · 3 of 113 slices shown]
[im 29/113  soft-tissue]
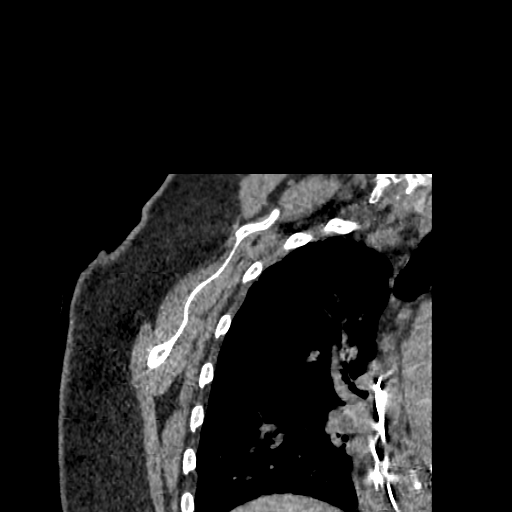
[im 57/113  soft-tissue]
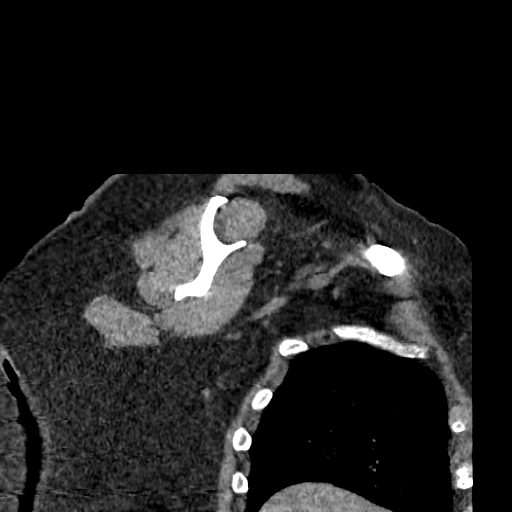
[im 85/113  soft-tissue]
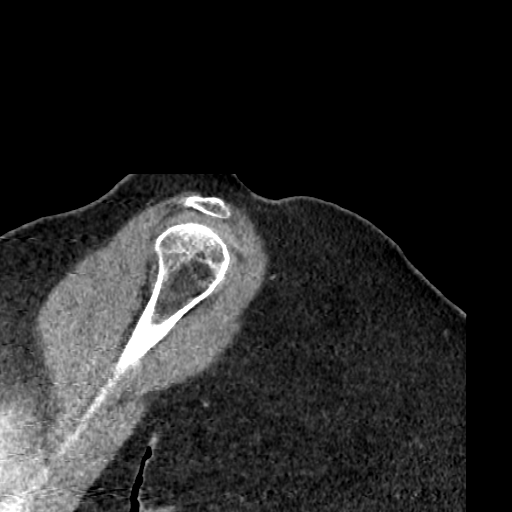

[Series 301: cor soft · oblique · 0.54mm/px · 3 of 113 slices shown]
[im 29/113  soft-tissue]
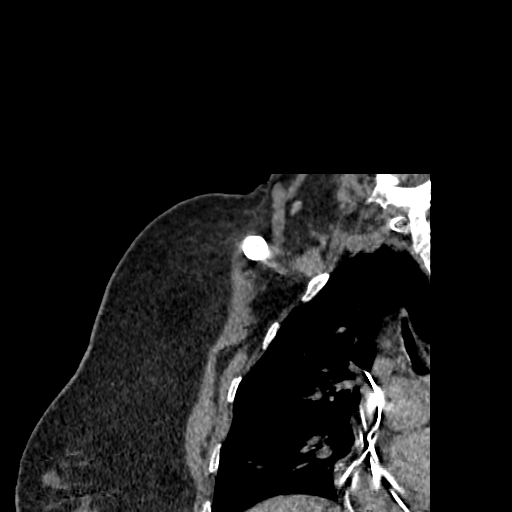
[im 57/113  soft-tissue]
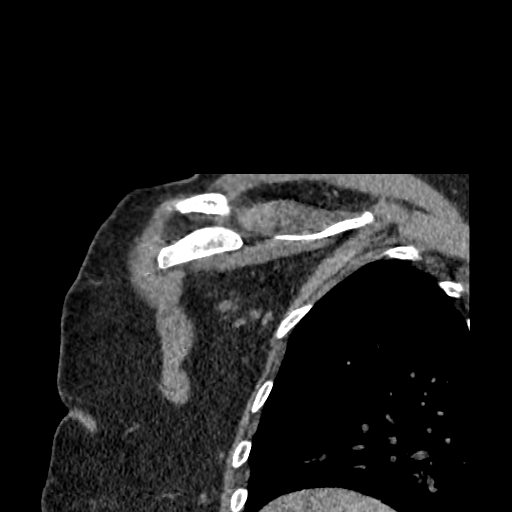
[im 85/113  soft-tissue]
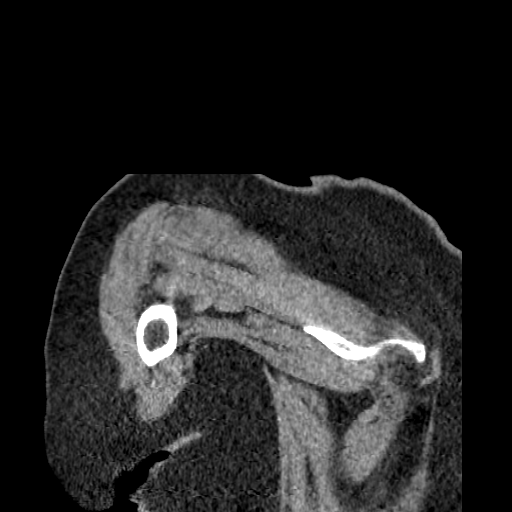

[8 of 14 positions shown; findings below may reference images not displayed]

FINDINGS: Bones/Joint/Cartilage

No fracture or focal bony lesion is identified. No avascular
necrosis of the humeral head. Very small osteophyte off the medial
margin of the humeral head is incidentally noted. Mild
acromioclavicular degenerative change is seen.

Ligaments

Suboptimally assessed by CT.

Muscles and Tendons

The rotator cuff appears intact. No muscular atrophy, focal lesion
or evidence of infectious or inflammatory change is identified.

Soft tissues

Imaged lung parenchyma demonstrates scattered ground-glass
attenuation which may be due to atelectasis and is not notably
change compared the prior CT. Leads from pacing device are noted.
IMPRESSION: No acute or focal abnormality. No finding to explain the patient's
symptoms.

Mild acromioclavicular and glenohumeral degenerative disease.

## 2018-09-01 IMAGING — CT CT HUMERUS*R* W/O CM
3 of 4 series · 17 of 34 positions shown, 19 images · non-contrast
Comparison: CT chest 04/28/2016.

CLINICAL DATA: Right shoulder and arm pain for 3 months. Patient
with a history of melanoma of the right forearm and [id number]

EXAM:
CT OF THE RIGHT HUMERUS AND SHOULDER WITHOUT CONTRAST
TECHNIQUE: Multidetector CT imaging was performed according to the standard
protocol. Multiplanar CT image reconstructions were also generated.

[Series 5: humerus soft · axial · 0.36mm/px · z∈[-342,-65]mm · 8 of 178 slices shown, 10 images]
[im 17/178  soft-tissue]
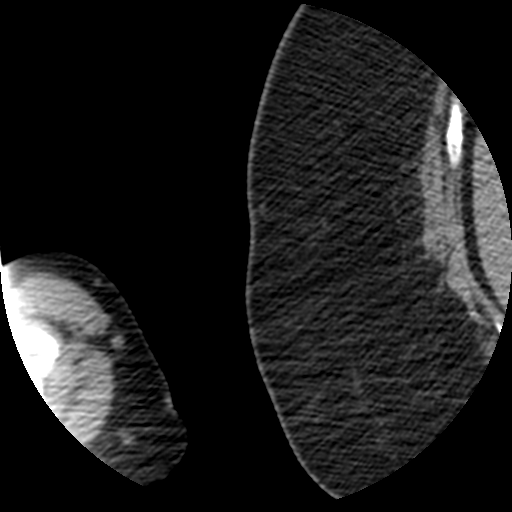
[im 17/178  bone]
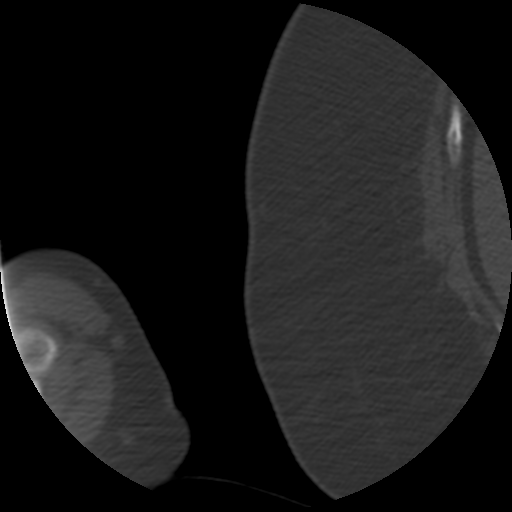
[im 33/178  bone]
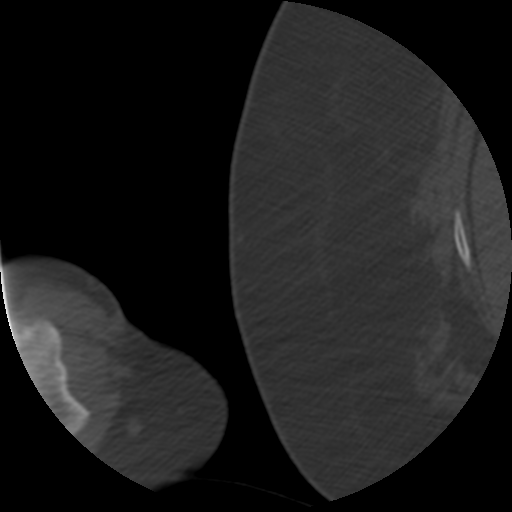
[im 65/178  bone]
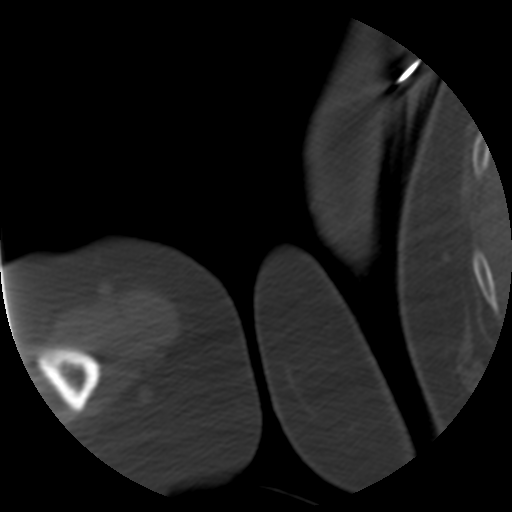
[im 81/178  bone]
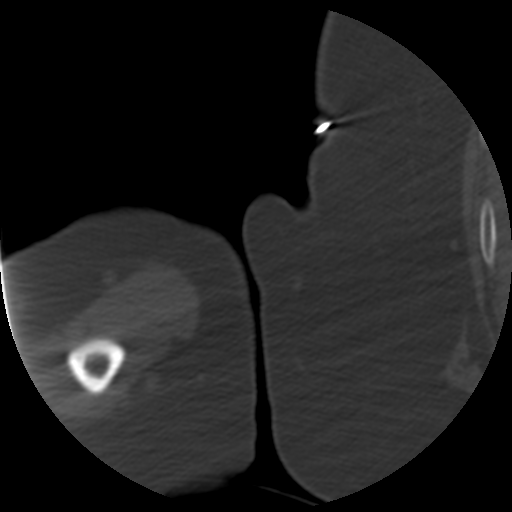
[im 97/178  soft-tissue]
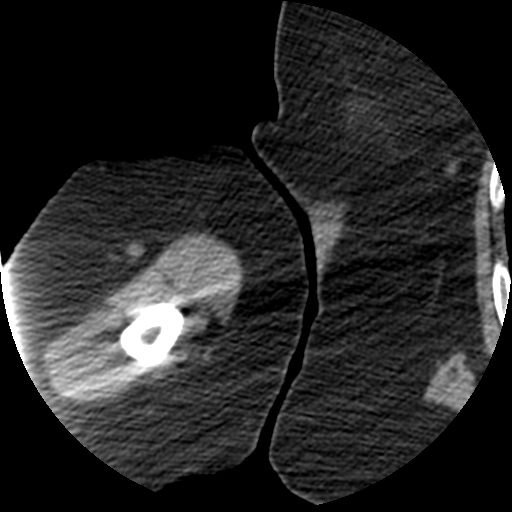
[im 97/178  bone]
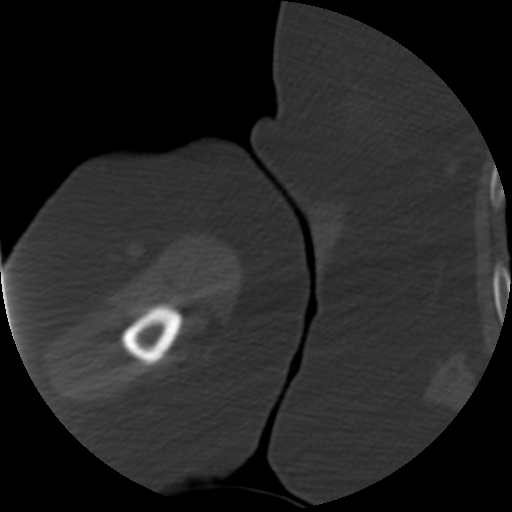
[im 113/178  bone]
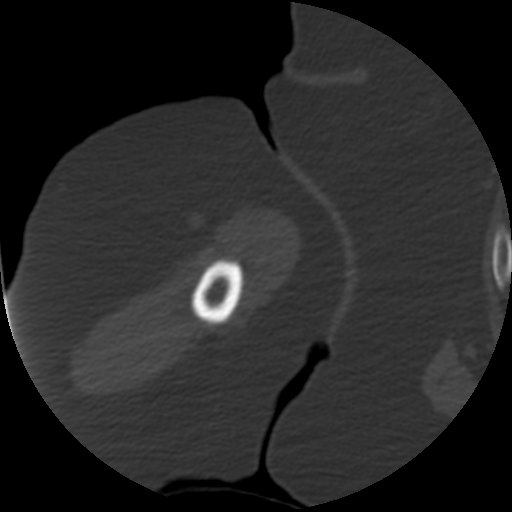
[im 145/178  bone]
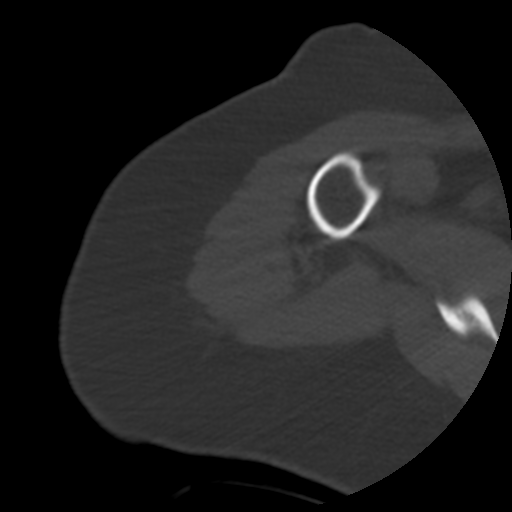
[im 161/178  bone]
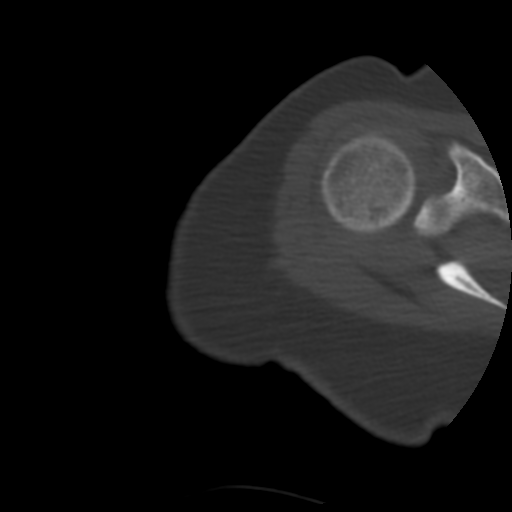

[Series 300: cor soft · sagittal · 0.68mm/px · 6 of 87 slices shown (1 of 2)]
[im 34/87  bone]
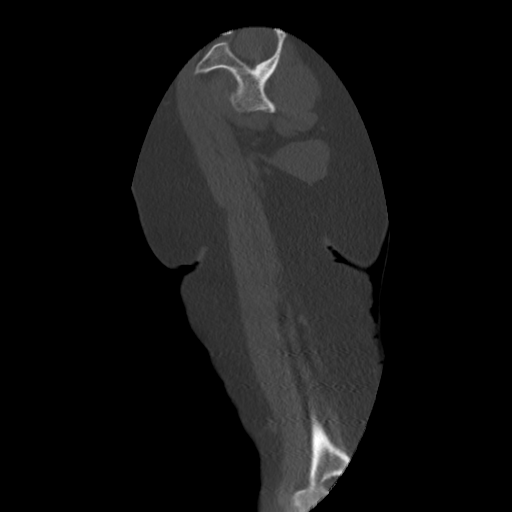
[im 39/87  bone]
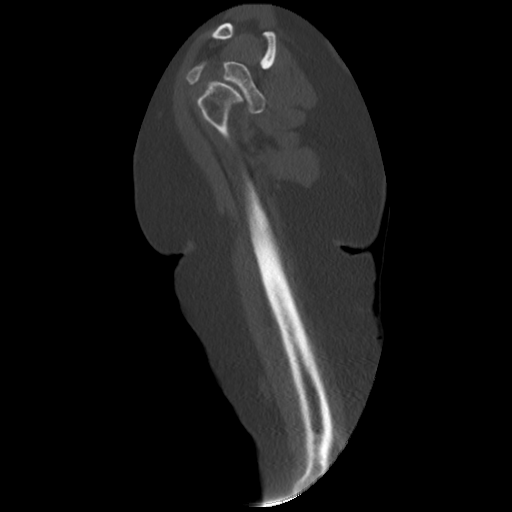
[im 43/87  soft-tissue]
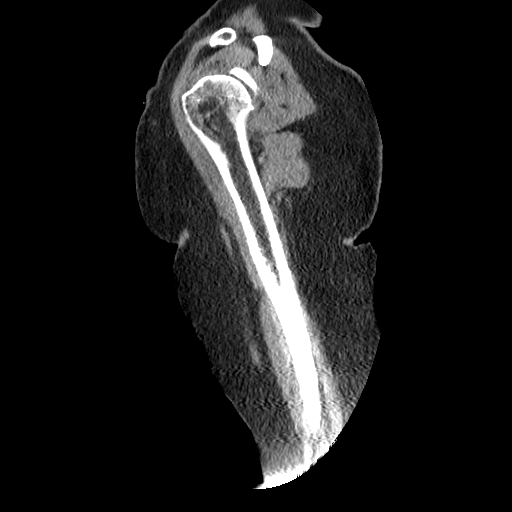
[im 44/87  bone]
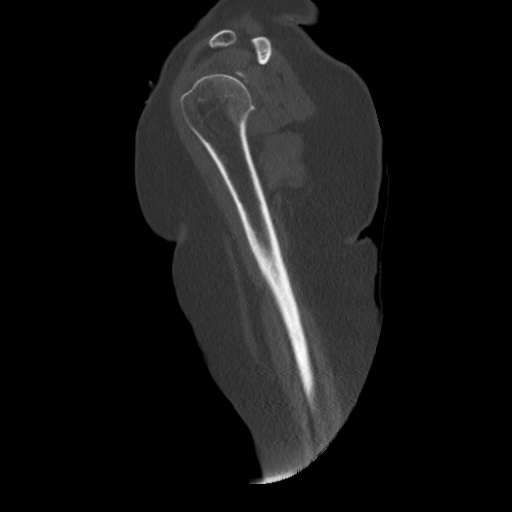
[im 48/87  bone]
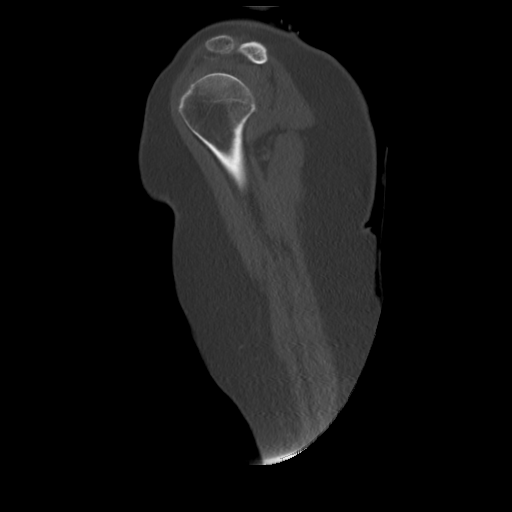
[im 53/87  bone]
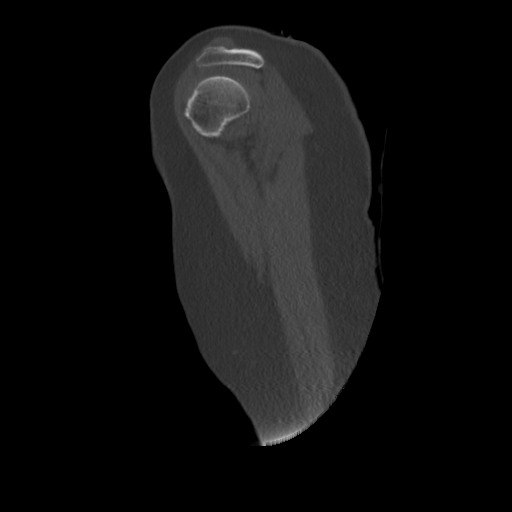

[Series 302: cor soft · coronal · 0.68mm/px · 3 of 85 slices shown (2 of 2)]
[im 22/85  bone]
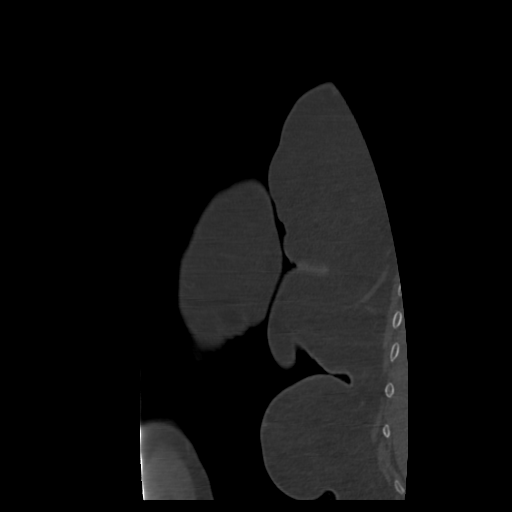
[im 36/85  bone]
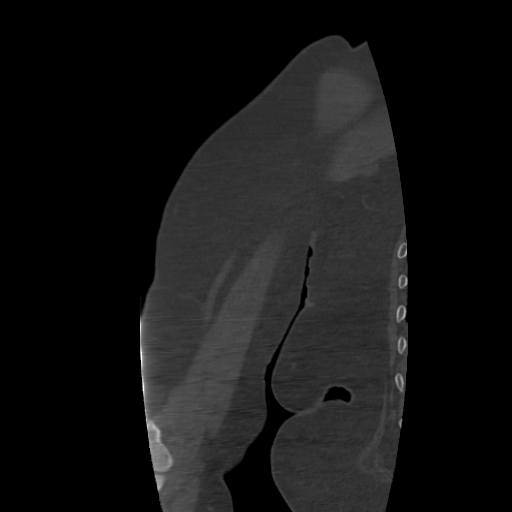
[im 49/85  bone]
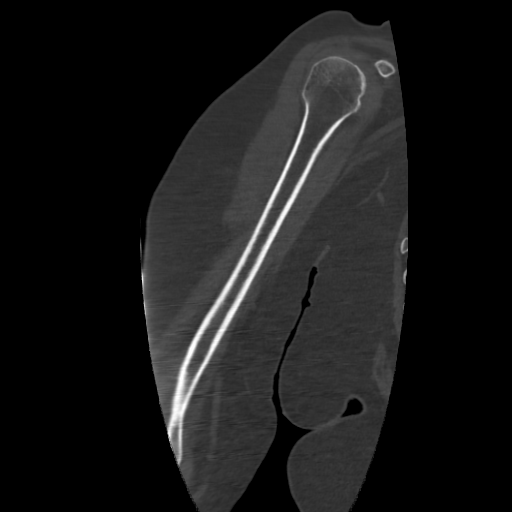

[17 of 34 positions shown; findings below may reference images not displayed]

FINDINGS: Bones/Joint/Cartilage

No fracture or focal bony lesion is identified. No avascular
necrosis of the humeral head. Very small osteophyte off the medial
margin of the humeral head is incidentally noted. Mild
acromioclavicular degenerative change is seen.

Ligaments

Suboptimally assessed by CT.

Muscles and Tendons

The rotator cuff appears intact. No muscular atrophy, focal lesion
or evidence of infectious or inflammatory change is identified.

Soft tissues

Imaged lung parenchyma demonstrates scattered ground-glass
attenuation which may be due to atelectasis and is not notably
change compared the prior CT. Leads from pacing device are noted.
IMPRESSION: No acute or focal abnormality. No finding to explain the patient's
symptoms.

Mild acromioclavicular and glenohumeral degenerative disease.

## 2018-09-07 ENCOUNTER — Ambulatory Visit (HOSPITAL_COMMUNITY)
Admission: RE | Admit: 2018-09-07 | Discharge: 2018-09-07 | Disposition: A | Discharge status: Home / Self Care | Payer: Medicare Other | Source: Ambulatory Visit | Attending: Geriatric Medicine | Admitting: Geriatric Medicine

## 2018-09-07 ENCOUNTER — Other Ambulatory Visit (HOSPITAL_COMMUNITY): Payer: Self-pay | Admitting: Geriatric Medicine

## 2018-09-07 DIAGNOSIS — R609 Edema, unspecified: Secondary | ICD-10-CM

## 2018-09-07 DIAGNOSIS — I82551 Chronic embolism and thrombosis of right peroneal vein: Secondary | ICD-10-CM | POA: Insufficient documentation

## 2018-09-07 DIAGNOSIS — R600 Localized edema: Secondary | ICD-10-CM | POA: Diagnosis not present

## 2018-09-07 NOTE — Progress Notes (Signed)
Preliminary notes--Bilateral lower extremities venous duplex exam completed. Positive for chronic deep vein thrombosis involving right calf veins. Result called the ordering physician's office spoke with Brittany(CMA), the on call physician will contact the patient. Melissa Novak (RDMS RVT) 09/07/18 3:06 PM

## 2018-09-13 ENCOUNTER — Telehealth: Payer: Self-pay | Admitting: Cardiology

## 2018-09-13 NOTE — Telephone Encounter (Signed)
Returned call to Pt.  Pt calling about swelling in her feet and ankles.  Per Pt when she goes to play bridge her feet and ankles swell.  Then she goes home and props up her feet and the swelling goes down.  Pt also calling because she was diagnosed with a chronic blood clot in her right calf last Friday but she does not think that is causing the bilateral swelling.  Discussed edema and salt. Encouraged Pt to weight herself daily and if she notices a weight gain she needed to greatly reduce her salt the next day.  Discussed lasix and it's effect on salt and edema and need to monitor kidneys if she takes addl lasix.  Pt take away from conversation:  Pt states she will try to walk around more often when she is playing Bridge to try to reduce dependent edema.  Pt states she will weigh herself daily and try to manage her edema that way.  Advised Pt to call office if she continues to have edema.  Asked her to have Dr. Felipa Eth send her upcoming lab results to [facility] for review.

## 2018-09-13 NOTE — Telephone Encounter (Signed)
New message  Pt c/o swelling: STAT is pt has developed SOB within 24 hours  1) How much weight have you gained and in what time span? Don't know   2) If swelling, where is the swelling located? Both legs ankles and feet , patient has chronic blood clot in the right leg  3) Are you currently taking a fluid pill? Yes   4) Are you currently SOB? No   5) Do you have a log of your daily weights (if so, list)? No   6) Have you gained 3 pounds in a day or 5 pounds in a week?no   7) Have you traveled recently? No

## 2018-09-14 DIAGNOSIS — Z79899 Other long term (current) drug therapy: Secondary | ICD-10-CM | POA: Diagnosis not present

## 2018-09-18 ENCOUNTER — Telehealth: Payer: Self-pay | Admitting: Oncology

## 2018-09-18 NOTE — Telephone Encounter (Signed)
Patient called to reschedule  °

## 2018-09-24 DIAGNOSIS — L821 Other seborrheic keratosis: Secondary | ICD-10-CM | POA: Diagnosis not present

## 2018-09-24 DIAGNOSIS — Z85820 Personal history of malignant melanoma of skin: Secondary | ICD-10-CM | POA: Diagnosis not present

## 2018-09-28 DIAGNOSIS — I82409 Acute embolism and thrombosis of unspecified deep veins of unspecified lower extremity: Secondary | ICD-10-CM | POA: Diagnosis not present

## 2018-09-28 DIAGNOSIS — I10 Essential (primary) hypertension: Secondary | ICD-10-CM | POA: Diagnosis not present

## 2018-09-28 DIAGNOSIS — N3941 Urge incontinence: Secondary | ICD-10-CM | POA: Diagnosis not present

## 2018-09-28 DIAGNOSIS — L89309 Pressure ulcer of unspecified buttock, unspecified stage: Secondary | ICD-10-CM | POA: Diagnosis not present

## 2018-09-28 DIAGNOSIS — R609 Edema, unspecified: Secondary | ICD-10-CM | POA: Diagnosis not present

## 2018-09-28 DIAGNOSIS — I5042 Chronic combined systolic (congestive) and diastolic (congestive) heart failure: Secondary | ICD-10-CM | POA: Diagnosis not present

## 2018-10-09 LAB — CUP PACEART REMOTE DEVICE CHECK
Battery Remaining Longevity: 74 mo
Brady Statistic AP VP Percent: 1 %
Brady Statistic AP VS Percent: 1 %
Brady Statistic AS VP Percent: 99 %
Brady Statistic RA Percent Paced: 1 %
Implantable Lead Implant Date: 20130408
Implantable Lead Implant Date: 20180827
Implantable Lead Location: 753859
Implantable Pulse Generator Implant Date: 20180827
Lead Channel Impedance Value: 450 Ohm
Lead Channel Pacing Threshold Amplitude: 0.5 V
Lead Channel Pacing Threshold Amplitude: 1.875 V
Lead Channel Pacing Threshold Pulse Width: 0.5 ms
Lead Channel Pacing Threshold Pulse Width: 0.8 ms
Lead Channel Setting Pacing Amplitude: 1.875
Lead Channel Setting Pacing Pulse Width: 0.5 ms
Lead Channel Setting Sensing Sensitivity: 5 mV
MDC IDC LEAD IMPLANT DT: 20130408
MDC IDC LEAD LOCATION: 753858
MDC IDC LEAD LOCATION: 753860
MDC IDC MSMT BATTERY REMAINING PERCENTAGE: 95.5 %
MDC IDC MSMT BATTERY VOLTAGE: 2.98 V
MDC IDC MSMT LEADCHNL LV IMPEDANCE VALUE: 850 Ohm
MDC IDC MSMT LEADCHNL RA IMPEDANCE VALUE: 390 Ohm
MDC IDC MSMT LEADCHNL RA PACING THRESHOLD AMPLITUDE: 0.875 V
MDC IDC MSMT LEADCHNL RA PACING THRESHOLD PULSEWIDTH: 0.4 ms
MDC IDC MSMT LEADCHNL RA SENSING INTR AMPL: 3.1 mV
MDC IDC PG SERIAL: 7946848
MDC IDC SESS DTM: 20191007060013
MDC IDC SET LEADCHNL LV PACING AMPLITUDE: 2.875
MDC IDC SET LEADCHNL LV PACING PULSEWIDTH: 0.8 ms
MDC IDC SET LEADCHNL RV PACING AMPLITUDE: 2 V
MDC IDC STAT BRADY AS VS PERCENT: 1 %

## 2018-10-12 ENCOUNTER — Other Ambulatory Visit: Payer: Medicare Other

## 2018-10-12 ENCOUNTER — Ambulatory Visit: Payer: Medicare Other | Admitting: Oncology

## 2018-10-19 ENCOUNTER — Ambulatory Visit: Payer: Medicare Other | Admitting: Oncology

## 2018-10-19 ENCOUNTER — Other Ambulatory Visit: Payer: Medicare Other

## 2018-10-22 ENCOUNTER — Encounter: Payer: Self-pay | Admitting: Internal Medicine

## 2018-10-22 ENCOUNTER — Ambulatory Visit (INDEPENDENT_AMBULATORY_CARE_PROVIDER_SITE_OTHER): Payer: Medicare Other | Admitting: Internal Medicine

## 2018-10-22 VITALS — BP 128/76 | HR 92 | Ht 62.0 in | Wt 216.4 lb

## 2018-10-22 DIAGNOSIS — I5022 Chronic systolic (congestive) heart failure: Secondary | ICD-10-CM | POA: Diagnosis not present

## 2018-10-22 DIAGNOSIS — I442 Atrioventricular block, complete: Secondary | ICD-10-CM

## 2018-10-22 DIAGNOSIS — Z950 Presence of cardiac pacemaker: Secondary | ICD-10-CM | POA: Diagnosis not present

## 2018-10-22 DIAGNOSIS — I10 Essential (primary) hypertension: Secondary | ICD-10-CM | POA: Diagnosis not present

## 2018-10-22 DIAGNOSIS — Z23 Encounter for immunization: Secondary | ICD-10-CM | POA: Diagnosis not present

## 2018-10-22 LAB — CUP PACEART INCLINIC DEVICE CHECK
Implantable Lead Implant Date: 20130408
Implantable Lead Implant Date: 20180827
Implantable Pulse Generator Implant Date: 20180827
MDC IDC LEAD IMPLANT DT: 20130408
MDC IDC LEAD LOCATION: 753858
MDC IDC LEAD LOCATION: 753859
MDC IDC LEAD LOCATION: 753860
MDC IDC PG SERIAL: 7946848
MDC IDC SESS DTM: 20191202140727

## 2018-10-22 NOTE — Progress Notes (Signed)
HPI Melissa Novak returns today for ongoing evaluation and management of her biventricular pacemaker. She has done well in the interim. She denies chest pain. Her shortness of breath is much improved. No syncope. She has developed a DVT in her leg and has been placed on Eliquis. Allergies  Allergen Reactions  . Sulfa Antibiotics Anaphylaxis  . Percocet [Oxycodone-Acetaminophen] Rash  . Tramadol Hives, Nausea Only and Other (See Comments)    Blurred vision  . Benadryl [Diphenhydramine Hcl] Rash and Other (See Comments)    BLURRED VISION  . Cephalexin Rash  . Codeine Rash    Hydrocodone is the only codeine product that is ok to take  . Demerol Nausea Only and Rash  . Dilaudid [Hydromorphone Hcl] Nausea And Vomiting  . Dimenhydrinate Rash and Other (See Comments)    BLURRED VISION  . Meclizine Hcl Rash and Other (See Comments)    BLURRED VISION  . Penicillins Rash and Other (See Comments)    FEVER Has patient had a PCN reaction causing immediate rash, facial/tongue/throat swelling, SOB or lightheadedness with hypotension: Yes Has patient had a PCN reaction causing severe rash involving mucus membranes or skin necrosis: No Has patient had a PCN reaction that required hospitalization: Yes Has patient had a PCN reaction occurring within the last 10 years: No If all of the above answers are "NO", then may proceed with Cephalosporin use.   Marland Kitchen Pentazocine Lactate Rash and Other (See Comments)    FEVER     Current Outpatient Medications  Medication Sig Dispense Refill  . amLODipine (NORVASC) 2.5 MG tablet Take 2.5 mg by mouth daily.    Marland Kitchen apixaban (ELIQUIS) 5 MG TABS tablet Take 5 mg by mouth 2 (two) times daily.    Marland Kitchen atorvastatin (LIPITOR) 10 MG tablet Take 10 mg by mouth daily.     . calcium carbonate (OS-CAL) 600 MG TABS tablet Take 600 mg by mouth daily.     . furosemide (LASIX) 40 MG tablet Take 1 tablet (40 mg total) by mouth daily. 90 tablet 3  . GARLIC PO Take 1 tablet by  mouth daily.     Marland Kitchen HYDROcodone-acetaminophen (NORCO) 5-325 MG tablet Take 1-2 tablets by mouth every 6 (six) hours as needed for moderate pain or severe pain. 30 tablet 0  . losartan (COZAAR) 50 MG tablet Take 50 mg by mouth 2 (two) times daily.    . metFORMIN (GLUCOPHAGE-XR) 500 MG 24 hr tablet Take 500 mg by mouth daily with supper.  3  . Misc Natural Products (OSTEO BI-FLEX TRIPLE STRENGTH) TABS Take 1 tablet by mouth at bedtime.    . Multiple Vitamin (MULTIVITAMIN WITH MINERALS) TABS Take 1 tablet by mouth daily.    Marland Kitchen saccharomyces boulardii (FLORASTOR) 250 MG capsule Take 250 mg by mouth at bedtime.    . valACYclovir (VALTREX) 1000 MG tablet Take 1,000 mg by mouth daily.     No current facility-administered medications for this visit.      Past Medical History:  Diagnosis Date  . Anxiety   . Benign positional vertigo   . Cancer ([place])    melanoma right forearm  . Complete heart block ([place])   . Coronary artery disease CARDIOLOGIST- DR Gillian Shields-  LAST VISIT THIS WEEK--  WILL REQUEST NOTE, STRESS TEST    PT DENIES S & S  . Cough    pt states has had cough  5-7 weeks. pcp aware  . Diabetes ([place]) 08/2016  . Diverticulosis   .  History of pulmonary embolism 1970'S  . History of shingles 2004 --  EYES  , NO RESIDUAL PAIN  . Hypertension   . LBBB (left bundle branch block)   . Malignant melanoma of right forearm ([place]) 03/17/2016  . Melanoma of forearm, right ([place]) 01/2016  . Obesity   . Pacemaker 4/13   DDD St Jude  . PONV (postoperative nausea and vomiting)    pt states no n/v w 4/13 surgery  . Prolapse of anterior vaginal wall   . Pulmonary embolism ([place]) 1980  . Vitamin D deficiency     ROS:   All systems reviewed and negative except as noted in the HPI.   Past Surgical History:  Procedure Laterality Date  . BIV UPGRADE N/A 07/17/2017   Procedure: BiV Upgrade;  Surgeon: Evans Lance, MD;  Location: [place] [facility];  Service: Cardiovascular;  Laterality: N/A;  .  BREAST SURGERY     biopsy  . CARDIOVASCULAR STRESS TEST  02-14-2012  DR SKAINS   LOW RISK NUCLEAR TEST/ MILD FIXED DEFECT ALONG THE DISTAL ANTERIOR SEPTAL WALL CONSTISTENT WITH LBBB/ NO ISCHEMIA   . CATARACT EXTRACTION W/ INTRAOCULAR LENS  IMPLANT, BILATERAL    . CHOLECYSTECTOMY  1967  . INSERT / REPLACE / REMOVE PACEMAKER  4/13   ddd St Jude pacer,d/t symptomatic bradycardia w intermittent complete heart block.  Inserted By Dr. Crissie Sickles 4/13  . MELANOMA EXCISION Right 03/17/2016   Procedure: WIDE EXCISION MELANOMA RIGHT FOREARM;  Surgeon: Fanny Skates, MD;  Location: [facility];  Service: General;  Laterality: Right;  . PERMANENT PACEMAKER INSERTION N/A 02/27/2012   Procedure: PERMANENT PACEMAKER INSERTION;  Surgeon: Evans Lance, MD;  Location: [facility];  Service: Cardiovascular;  Laterality: N/A;  . 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 PUBOVAGINAL SLING  12/07/2012   Procedure: Gaynelle Arabian;  Surgeon: Ailene Rud, MD;  Location: [facility];  Service: Urology;  Laterality: N/A;  solyx sling   . TRANSTHORACIC ECHOCARDIOGRAM  02-15-2012  DR SKAINS   NORMAL EF 60-65%/ MILD AORTIC SCLEROSIS, NO STENOSIS, GRADE I DIASTOLIC DYSFUNCTION  . VAGINAL HYSTERECTOMY  1974   STILL HAS OVARIES     Family History  Problem Relation Age of Onset  . Cancer Mother   . Cancer Father   . Kidney disease Son   . Cancer Brother   . Kidney disease Brother      Social History   Socioeconomic History  . Marital status: Widowed    Spouse name: Not on file  . Number of children: Not on file  . Years of education: Not on file  . Highest education level: Not on file  Occupational History  . Not on file  Social Needs  . Financial resource strain: Not on file  . Food  insecurity:    Worry: Not on file    Inability: Not on file  . Transportation needs:    Medical: Not on file    Non-medical: Not on file  Tobacco Use  . Smoking status: Former Research scientist (life sciences)  . Smokeless tobacco: Never Used  . Tobacco comment: have not smoked in 50 years 03/16/16  Substance and Sexual Activity  . Alcohol use: Yes    Comment: RARE  . Drug use: No  . Sexual activity: Not on  file  Lifestyle  . Physical activity:    Days per week: Not on file    Minutes per session: Not on file  . Stress: Not on file  Relationships  . Social connections:    Talks on phone: Not on file    Gets together: Not on file    Attends religious service: Not on file    Active member of club or organization: Not on file    Attends meetings of clubs or organizations: Not on file    Relationship status: Not on file  . Intimate partner violence:    Fear of current or ex partner: Not on file    Emotionally abused: Not on file    Physically abused: Not on file    Forced sexual activity: Not on file  Other Topics Concern  . Not on file  Social History Narrative  . Not on file     BP 128/76   Pulse 92   Ht 5\' 2"  (1.575 m)   Wt 216 lb 6.4 oz (98.2 kg)   BMI 39.58 kg/m   Physical Exam:  Well appearing NAD HEENT: Unremarkable Neck:  No JVD, no thyromegally Lymphatics:  No adenopathy Back:  No CVA tenderness Lungs:  Clear with no wheezes HEART:  Regular rate rhythm, no murmurs, no rubs, no clicks Abd:  soft, positive bowel sounds, no organomegally, no rebound, no guarding Ext:  2 plus pulses, no edema, no cyanosis, no clubbing Skin:  No rashes no nodules Neuro:  CN II through XII intact, motor grossly intact  EKG - nsr with biv pacing  DEVICE  Normal device function.  See PaceArt for details.   Assess/Plan: 1. CHB - she is asymptomatic s/p PPM. 2. PPM - her St. Jude biv ppm is working normally.  3. HTN - her blood pressure is controlled. No change her meds. 4. Obesity - I encouraged  her to work toward getting under 200 lbs.  Mikle Bosworth.D.

## 2018-10-22 NOTE — Patient Instructions (Signed)
Medication Instructions:  Your physician recommends that you continue on your current medications as directed. Please refer to the Current Medication list given to you today.  Labwork: None ordered.  Testing/Procedures: None ordered.  Follow-Up: Your physician wants you to follow-up in: one year with Dr.Taylor.   You will receive a reminder letter in the mail two months in advance. If you don't receive a letter, please call our office to schedule the follow-up appointment.  Remote monitoring is used to monitor your Pacemaker from home. This monitoring reduces the number of office visits required to check your device to one time per year. It allows [place] to keep an eye on the functioning of your device to ensure it is working properly. You are scheduled for a device check from home on 11/26/2018. You may send your transmission at any time that day. If you have a wireless device, the transmission will be sent automatically. After your physician reviews your transmission, you will receive a postcard with your next transmission date.  Any Other Special Instructions Will Be Listed Below (If Applicable).  If you need a refill on your cardiac medications before your next appointment, please call your pharmacy.

## 2018-10-25 ENCOUNTER — Encounter (HOSPITAL_BASED_OUTPATIENT_CLINIC_OR_DEPARTMENT_OTHER): Payer: Medicare Other | Attending: Internal Medicine

## 2018-11-01 DIAGNOSIS — Z1389 Encounter for screening for other disorder: Secondary | ICD-10-CM | POA: Diagnosis not present

## 2018-11-01 DIAGNOSIS — M545 Low back pain: Secondary | ICD-10-CM | POA: Diagnosis not present

## 2018-11-01 DIAGNOSIS — F330 Major depressive disorder, recurrent, mild: Secondary | ICD-10-CM | POA: Diagnosis not present

## 2018-11-01 DIAGNOSIS — Z79899 Other long term (current) drug therapy: Secondary | ICD-10-CM | POA: Diagnosis not present

## 2018-11-01 DIAGNOSIS — R197 Diarrhea, unspecified: Secondary | ICD-10-CM | POA: Diagnosis not present

## 2018-11-01 DIAGNOSIS — J984 Other disorders of lung: Secondary | ICD-10-CM | POA: Diagnosis not present

## 2018-11-01 DIAGNOSIS — G8929 Other chronic pain: Secondary | ICD-10-CM | POA: Diagnosis not present

## 2018-11-01 DIAGNOSIS — Z0000 Encounter for general adult medical examination without abnormal findings: Secondary | ICD-10-CM | POA: Diagnosis not present

## 2018-11-01 DIAGNOSIS — M810 Age-related osteoporosis without current pathological fracture: Secondary | ICD-10-CM | POA: Diagnosis not present

## 2018-11-01 DIAGNOSIS — I700 Atherosclerosis of aorta: Secondary | ICD-10-CM | POA: Diagnosis not present

## 2018-11-01 DIAGNOSIS — Z6839 Body mass index (BMI) 39.0-39.9, adult: Secondary | ICD-10-CM | POA: Diagnosis not present

## 2018-11-01 DIAGNOSIS — I10 Essential (primary) hypertension: Secondary | ICD-10-CM | POA: Diagnosis not present

## 2018-11-01 DIAGNOSIS — E1169 Type 2 diabetes mellitus with other specified complication: Secondary | ICD-10-CM | POA: Diagnosis not present

## 2018-11-01 DIAGNOSIS — M4850XD Collapsed vertebra, not elsewhere classified, site unspecified, subsequent encounter for fracture with routine healing: Secondary | ICD-10-CM | POA: Diagnosis not present

## 2018-11-02 ENCOUNTER — Telehealth: Payer: Self-pay

## 2018-11-02 NOTE — Telephone Encounter (Signed)
Patient called and requested that all future appointments be cancelled and she is not returning per the recommendation of her 8 other physicians. Appointments cancelled and MD made aware.

## 2018-11-08 ENCOUNTER — Ambulatory Visit: Payer: Medicare Other | Admitting: Oncology

## 2018-11-08 ENCOUNTER — Other Ambulatory Visit: Payer: Medicare Other

## 2018-11-09 ENCOUNTER — Ambulatory Visit: Payer: Medicare Other | Admitting: Oncology

## 2018-11-09 ENCOUNTER — Other Ambulatory Visit: Payer: Medicare Other

## 2018-11-26 ENCOUNTER — Ambulatory Visit (INDEPENDENT_AMBULATORY_CARE_PROVIDER_SITE_OTHER): Payer: Medicare Other

## 2018-11-26 DIAGNOSIS — I442 Atrioventricular block, complete: Secondary | ICD-10-CM | POA: Diagnosis not present

## 2018-11-26 DIAGNOSIS — H02842 Edema of right lower eyelid: Secondary | ICD-10-CM | POA: Diagnosis not present

## 2018-11-26 DIAGNOSIS — H02841 Edema of right upper eyelid: Secondary | ICD-10-CM | POA: Diagnosis not present

## 2018-11-26 DIAGNOSIS — I5022 Chronic systolic (congestive) heart failure: Secondary | ICD-10-CM

## 2018-11-27 LAB — CUP PACEART REMOTE DEVICE CHECK
Battery Remaining Longevity: 70 mo
Battery Remaining Percentage: 95.5 %
Battery Voltage: 2.96 V
Brady Statistic AP VS Percent: 0 %
Brady Statistic RA Percent Paced: 1 %
Date Time Interrogation Session: 20200106070020
Implantable Lead Implant Date: 20130408
Implantable Lead Location: 753860
Lead Channel Impedance Value: 380 Ohm
Lead Channel Pacing Threshold Amplitude: 0.875 V
Lead Channel Setting Pacing Amplitude: 1.875
Lead Channel Setting Pacing Pulse Width: 0.5 ms
Lead Channel Setting Pacing Pulse Width: 0.8 ms
MDC IDC LEAD IMPLANT DT: 20130408
MDC IDC LEAD IMPLANT DT: 20180827
MDC IDC LEAD LOCATION: 753858
MDC IDC LEAD LOCATION: 753859
MDC IDC MSMT LEADCHNL LV IMPEDANCE VALUE: 860 Ohm
MDC IDC MSMT LEADCHNL LV PACING THRESHOLD AMPLITUDE: 1.625 V
MDC IDC MSMT LEADCHNL LV PACING THRESHOLD PULSEWIDTH: 0.8 ms
MDC IDC MSMT LEADCHNL RA PACING THRESHOLD PULSEWIDTH: 0.4 ms
MDC IDC MSMT LEADCHNL RA SENSING INTR AMPL: 3.7 mV
MDC IDC MSMT LEADCHNL RV IMPEDANCE VALUE: 430 Ohm
MDC IDC MSMT LEADCHNL RV PACING THRESHOLD AMPLITUDE: 0.5 V
MDC IDC MSMT LEADCHNL RV PACING THRESHOLD PULSEWIDTH: 0.5 ms
MDC IDC PG IMPLANT DT: 20180827
MDC IDC SET LEADCHNL LV PACING AMPLITUDE: 2.625
MDC IDC SET LEADCHNL RV PACING AMPLITUDE: 2 V
MDC IDC SET LEADCHNL RV SENSING SENSITIVITY: 5 mV
MDC IDC STAT BRADY AP VP PERCENT: 1 %
MDC IDC STAT BRADY AS VP PERCENT: 99 %
MDC IDC STAT BRADY AS VS PERCENT: 1 %
Pulse Gen Model: 3262
Pulse Gen Serial Number: 7946848

## 2018-11-27 NOTE — Progress Notes (Signed)
Remote pacemaker transmission.   

## 2018-11-29 DIAGNOSIS — H10411 Chronic giant papillary conjunctivitis, right eye: Secondary | ICD-10-CM | POA: Diagnosis not present

## 2019-01-15 DIAGNOSIS — Z23 Encounter for immunization: Secondary | ICD-10-CM | POA: Diagnosis not present

## 2019-01-23 DIAGNOSIS — I10 Essential (primary) hypertension: Secondary | ICD-10-CM | POA: Diagnosis not present

## 2019-01-23 DIAGNOSIS — Z7984 Long term (current) use of oral hypoglycemic drugs: Secondary | ICD-10-CM | POA: Diagnosis not present

## 2019-01-23 DIAGNOSIS — Z6841 Body Mass Index (BMI) 40.0 and over, adult: Secondary | ICD-10-CM | POA: Diagnosis not present

## 2019-01-23 DIAGNOSIS — I5042 Chronic combined systolic (congestive) and diastolic (congestive) heart failure: Secondary | ICD-10-CM | POA: Diagnosis not present

## 2019-01-23 DIAGNOSIS — E1169 Type 2 diabetes mellitus with other specified complication: Secondary | ICD-10-CM | POA: Diagnosis not present

## 2019-01-23 DIAGNOSIS — I700 Atherosclerosis of aorta: Secondary | ICD-10-CM | POA: Diagnosis not present

## 2019-02-20 DIAGNOSIS — I10 Essential (primary) hypertension: Secondary | ICD-10-CM | POA: Diagnosis not present

## 2019-02-20 DIAGNOSIS — I5020 Unspecified systolic (congestive) heart failure: Secondary | ICD-10-CM | POA: Diagnosis not present

## 2019-02-25 ENCOUNTER — Other Ambulatory Visit: Payer: Self-pay

## 2019-02-25 ENCOUNTER — Telehealth: Payer: Self-pay

## 2019-02-25 ENCOUNTER — Ambulatory Visit (INDEPENDENT_AMBULATORY_CARE_PROVIDER_SITE_OTHER): Payer: Medicare Other | Admitting: *Deleted

## 2019-02-25 DIAGNOSIS — I442 Atrioventricular block, complete: Secondary | ICD-10-CM | POA: Diagnosis not present

## 2019-02-25 LAB — CUP PACEART REMOTE DEVICE CHECK
Battery Remaining Longevity: 71 mo
Battery Remaining Percentage: 95.5 %
Battery Voltage: 2.96 V
Brady Statistic AP VP Percent: 1 %
Brady Statistic AP VS Percent: 1 %
Brady Statistic AS VP Percent: 99 %
Brady Statistic AS VS Percent: 1 %
Brady Statistic RA Percent Paced: 1 %
Date Time Interrogation Session: 20200406123124
Implantable Lead Implant Date: 20130408
Implantable Lead Implant Date: 20130408
Implantable Lead Implant Date: 20180827
Implantable Lead Location: 753858
Implantable Lead Location: 753859
Implantable Lead Location: 753860
Implantable Pulse Generator Implant Date: 20180827
Lead Channel Impedance Value: 380 Ohm
Lead Channel Impedance Value: 450 Ohm
Lead Channel Impedance Value: 860 Ohm
Lead Channel Pacing Threshold Amplitude: 0.5 V
Lead Channel Pacing Threshold Amplitude: 0.875 V
Lead Channel Pacing Threshold Amplitude: 1.5 V
Lead Channel Pacing Threshold Pulse Width: 0.4 ms
Lead Channel Pacing Threshold Pulse Width: 0.5 ms
Lead Channel Pacing Threshold Pulse Width: 0.8 ms
Lead Channel Sensing Intrinsic Amplitude: 3.6 mV
Lead Channel Setting Pacing Amplitude: 1.875
Lead Channel Setting Pacing Amplitude: 2 V
Lead Channel Setting Pacing Amplitude: 2.5 V
Lead Channel Setting Pacing Pulse Width: 0.5 ms
Lead Channel Setting Pacing Pulse Width: 0.8 ms
Lead Channel Setting Sensing Sensitivity: 5 mV
Pulse Gen Model: 3262
Pulse Gen Serial Number: 7946848

## 2019-02-25 NOTE — Telephone Encounter (Signed)
Spoke with patient to remind of missed remote transmission 

## 2019-03-04 NOTE — Progress Notes (Signed)
Remote pacemaker transmission.   

## 2019-03-26 DIAGNOSIS — R197 Diarrhea, unspecified: Secondary | ICD-10-CM | POA: Diagnosis not present

## 2019-03-26 DIAGNOSIS — E1169 Type 2 diabetes mellitus with other specified complication: Secondary | ICD-10-CM | POA: Diagnosis not present

## 2019-03-26 DIAGNOSIS — I10 Essential (primary) hypertension: Secondary | ICD-10-CM | POA: Diagnosis not present

## 2019-03-26 DIAGNOSIS — E7800 Pure hypercholesterolemia, unspecified: Secondary | ICD-10-CM | POA: Diagnosis not present

## 2019-04-04 ENCOUNTER — Encounter: Payer: Self-pay | Admitting: Cardiology

## 2019-04-04 ENCOUNTER — Telehealth (INDEPENDENT_AMBULATORY_CARE_PROVIDER_SITE_OTHER): Payer: Medicare Other | Admitting: Cardiology

## 2019-04-04 ENCOUNTER — Other Ambulatory Visit: Payer: Self-pay

## 2019-04-04 VITALS — Ht 62.0 in

## 2019-04-04 DIAGNOSIS — Z950 Presence of cardiac pacemaker: Secondary | ICD-10-CM

## 2019-04-04 DIAGNOSIS — I10 Essential (primary) hypertension: Secondary | ICD-10-CM

## 2019-04-04 DIAGNOSIS — I442 Atrioventricular block, complete: Secondary | ICD-10-CM | POA: Diagnosis not present

## 2019-04-04 DIAGNOSIS — I420 Dilated cardiomyopathy: Secondary | ICD-10-CM

## 2019-04-04 NOTE — Progress Notes (Signed)
Virtual Visit via Telephone Note   This visit type was conducted due to national recommendations for restrictions regarding the COVID-19 Pandemic (e.g. social distancing) in an effort to limit this patient's exposure and mitigate transmission in our community.  Due to her co-morbid illnesses, this patient is at least at moderate risk for complications without adequate follow up.  This format is felt to be most appropriate for this patient at this time.  The patient did not have access to video technology/had technical difficulties with video requiring transitioning to audio format only (telephone).  All issues noted in this document were discussed and addressed.  No physical exam could be performed with this format.  Please refer to the patient's chart for her  consent to telehealth for [facility].   Date:  04/04/2019   ID:  Melissa Novak, DOB 01-22-1934, MRN [id number]  Patient Location: Home Provider Location: Home  PCP:  Lajean Manes, MD  Cardiologist:  Candee Furbish, MD  Electrophysiologist:  None   Evaluation Performed:  Follow-Up Visit  Chief Complaint: Cardiomyopathy follow-up  History of Present Illness:    Melissa Novak is a 85 y.o. female with aftercare pacemaker, DVT previously in December 2019 on Eliquis here for follow-up.  Last visit was with Dr. Lovena Le on 10/22/2018.  Denied any syncope.  Shortness of breath seem to be improved.  No chest pain.  Compliant with her medications.  Overall she is doing well.  Staying home. Daughter lives with her.  The patient does not have symptoms concerning for COVID-19 infection (fever, chills, cough, or new shortness of breath).    Past Medical History:  Diagnosis Date  . Anxiety   . Benign positional vertigo   . Cancer ([place])    melanoma right forearm  . Complete heart block ([employer])   . Coronary artery disease CARDIOLOGIST- DR Gillian Shields-  LAST VISIT THIS WEEK--  WILL REQUEST NOTE, STRESS TEST    PT DENIES S & S  . Cough    pt states has had cough  5-7 weeks. pcp aware  . Diabetes ([place]) 08/2016  . Diverticulosis   . History of pulmonary embolism 1970'S  . History of shingles 2004 --  EYES  , NO RESIDUAL PAIN  . Hypertension   . LBBB (left bundle branch block)   . Malignant melanoma of right forearm ([place]) 03/17/2016  . Melanoma of forearm, right ([place]) 01/2016  . Obesity   . Pacemaker 4/13   DDD St Jude  . PONV (postoperative nausea and vomiting)    pt states no n/v w 4/13 surgery  . Prolapse of anterior vaginal wall   . Pulmonary embolism ([place]) 1980  . Vitamin D deficiency    Past Surgical History:  Procedure Laterality Date  . BIV UPGRADE N/A 07/17/2017   Procedure: BiV Upgrade;  Surgeon: Evans Lance, MD;  Location: [facility];  Service: Cardiovascular;  Laterality: N/A;  . BREAST SURGERY     biopsy  . CARDIOVASCULAR STRESS TEST  02-14-2012  DR Shanica Castellanos   LOW RISK NUCLEAR TEST/ MILD FIXED DEFECT ALONG THE DISTAL ANTERIOR SEPTAL WALL CONSTISTENT WITH LBBB/ NO ISCHEMIA   . CATARACT EXTRACTION W/ INTRAOCULAR LENS  IMPLANT, BILATERAL    . CHOLECYSTECTOMY  1967  . INSERT / REPLACE / REMOVE PACEMAKER  4/13   ddd St Jude pacer,d/t symptomatic bradycardia w intermittent complete heart block.  Inserted By Dr. Crissie Sickles 4/13  . MELANOMA EXCISION Right 03/17/2016   Procedure: WIDE EXCISION MELANOMA  RIGHT FOREARM;  Surgeon: Fanny Skates, MD;  Location: [facility];  Service: General;  Laterality: Right;  . PERMANENT PACEMAKER INSERTION N/A 02/27/2012   Procedure: PERMANENT PACEMAKER INSERTION;  Surgeon: Evans Lance, MD;  Location: [facility];  Service: Cardiovascular;  Laterality: N/A;  . 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 PUBOVAGINAL SLING  12/07/2012   Procedure: Gaynelle Arabian;  Surgeon: Ailene Rud, MD;  Location: [facility];  Service: Urology;  Laterality: N/A;  solyx sling   . TRANSTHORACIC ECHOCARDIOGRAM  02-15-2012  DR Koy Lamp   NORMAL EF 60-65%/ MILD AORTIC SCLEROSIS, NO STENOSIS, GRADE I DIASTOLIC DYSFUNCTION  . VAGINAL HYSTERECTOMY  1974   STILL HAS OVARIES     Current Meds  Medication Sig  . apixaban (ELIQUIS) 5 MG TABS tablet Take 5 mg by mouth 2 (two) times daily.  Marland Kitchen atorvastatin (LIPITOR) 10 MG tablet Take 10 mg by mouth daily.   . furosemide (LASIX) 20 MG tablet Take 20 mg by mouth daily.  Marland Kitchen losartan (COZAAR) 50 MG tablet Take 50 mg by mouth 2 (two) times daily.  . metoprolol succinate (TOPROL-XL) 25 MG 24 hr tablet Take 25 mg by mouth daily.  . valACYclovir (VALTREX) 1000 MG tablet Take 1,000 mg by mouth daily.     Allergies:   Sulfa antibiotics; Percocet [oxycodone-acetaminophen]; Tramadol; Benadryl [diphenhydramine hcl]; Cephalexin; Codeine; Demerol; Dilaudid [hydromorphone hcl]; Dimenhydrinate; Meclizine hcl; Penicillins; and Pentazocine lactate   Social History   Tobacco Use  . Smoking status: Former Research scientist (life sciences)  . Smokeless tobacco: Never Used  . Tobacco comment: have not smoked in 50 years 03/16/16  Substance Use Topics  . Alcohol use: Yes    Comment: RARE  . Drug use: No     Family Hx: The patient's family history includes Cancer in her brother, father, and mother; Kidney disease in her brother and son.  ROS:   Please see the history of present illness.    No fevers chills nausea vomiting syncope bleeding.  Mild cough at times. All other systems reviewed and are negative.   Prior CV studies:   The following studies were reviewed today:  Echocardiogram 12/28/2017-normal.  Previously 35 to 40%.  Labs/Other Tests and Data Reviewed:    EKG:  Prior EKG with paced rhythm.  Recent Labs: No results found for requested labs within last 8760 hours.   Recent Lipid Panel No results found for: CHOL, TRIG, HDL, CHOLHDL, LDLCALC,  LDLDIRECT  Wt Readings from Last 3 Encounters:  10/22/18 216 lb 6.4 oz (98.2 kg)  03/30/18 209 lb 6.4 oz (95 kg)  12/15/17 208 lb 3.2 oz (94.4 kg)     Objective:    Vital Signs:  Ht 5\' 2"  (1.575 m)   BMI 39.58 kg/m    VITAL SIGNS:  reviewed GEN:  no acute distress EYES:  sclerae anicteric, EOMI - Extraocular Movements Intact RESPIRATORY:  normal respiratory effort, symmetric expansion SKIN:  no rash, lesions or ulcers. MUSCULOSKELETAL:  no obvious deformities. NEURO:  alert and oriented x 3, no obvious focal deficit PSYCH:  normal affect  ASSESSMENT & PLAN:    Dilated cardiomyopathy - Since she has had upgraded biventricular device, she is doing quite well.  EF is now returned to normal.  She has had some cold her lungs previously.  Lasix has been utilized.  Complete heart block - Pacemaker, doing well.  Saint Jude.  Biventricular.  Dr. Lovena Le.  Essential hypertension -Continue to monitor.  Well-controlled. New medicine in 01/2019  Obesity -Continue to encourage weight loss.  DVT  - right leg DVT - chronic clot.   - prior PE at 67 with OCP.  -Proceeding with lifelong Eliquis.  Dr. Felipa Eth is monitoring blood work.  So far so good.  COVID-19 Education: The signs and symptoms of COVID-19 were discussed with the patient and how to seek care for testing (follow up with PCP or arrange E-visit).  The importance of social distancing was discussed today.  Time:   Today, I have spent 11 minutes with the patient with telehealth technology discussing the above problems.     Medication Adjustments/Labs and Tests Ordered: Current medicines are reviewed at length with the patient today.  Concerns regarding medicines are outlined above.   Tests Ordered: No orders of the defined types were placed in this encounter.   Medication Changes: No orders of the defined types were placed in this encounter.   Disposition:  Follow up in 1 year(s)  Signed, Candee Furbish, MD   04/04/2019 3:05 PM    [facility]

## 2019-04-04 NOTE — Patient Instructions (Signed)
  Medication Instructions:  The current medical regimen is effective;  continue present plan and medications.  If you need a refill on your cardiac medications before your next appointment, please call your pharmacy.   Follow-Up: Follow up in 1 year with Dr. Skains.  You will receive a letter in the mail 2 months before you are due.  Please call us when you receive this letter to schedule your follow up appointment.  Thank you for choosing [facility]!!     

## 2019-04-18 DIAGNOSIS — Z950 Presence of cardiac pacemaker: Secondary | ICD-10-CM | POA: Diagnosis not present

## 2019-04-18 DIAGNOSIS — I10 Essential (primary) hypertension: Secondary | ICD-10-CM | POA: Diagnosis not present

## 2019-04-18 DIAGNOSIS — I498 Other specified cardiac arrhythmias: Secondary | ICD-10-CM | POA: Diagnosis not present

## 2019-04-18 DIAGNOSIS — C4361 Malignant melanoma of right upper limb, including shoulder: Secondary | ICD-10-CM | POA: Diagnosis not present

## 2019-04-23 DIAGNOSIS — E119 Type 2 diabetes mellitus without complications: Secondary | ICD-10-CM | POA: Diagnosis not present

## 2019-04-23 DIAGNOSIS — H524 Presbyopia: Secondary | ICD-10-CM | POA: Diagnosis not present

## 2019-05-27 ENCOUNTER — Ambulatory Visit (INDEPENDENT_AMBULATORY_CARE_PROVIDER_SITE_OTHER): Payer: Medicare Other | Admitting: *Deleted

## 2019-05-27 DIAGNOSIS — I442 Atrioventricular block, complete: Secondary | ICD-10-CM | POA: Diagnosis not present

## 2019-05-27 LAB — CUP PACEART REMOTE DEVICE CHECK
Date Time Interrogation Session: 20200706115833
Implantable Lead Implant Date: 20130408
Implantable Lead Implant Date: 20130408
Implantable Lead Implant Date: 20180827
Implantable Lead Location: 753858
Implantable Lead Location: 753859
Implantable Lead Location: 753860
Implantable Pulse Generator Implant Date: 20180827
Pulse Gen Model: 3262
Pulse Gen Serial Number: 7946848

## 2019-05-28 DIAGNOSIS — R29810 Facial weakness: Secondary | ICD-10-CM | POA: Diagnosis not present

## 2019-05-28 DIAGNOSIS — Z4682 Encounter for fitting and adjustment of non-vascular catheter: Secondary | ICD-10-CM | POA: Diagnosis not present

## 2019-05-28 DIAGNOSIS — I10 Essential (primary) hypertension: Secondary | ICD-10-CM | POA: Diagnosis present

## 2019-05-28 DIAGNOSIS — R402114 Coma scale, eyes open, never, 24 hours or more after hospital admission: Secondary | ICD-10-CM | POA: Diagnosis not present

## 2019-05-28 DIAGNOSIS — J9601 Acute respiratory failure with hypoxia: Secondary | ICD-10-CM | POA: Diagnosis present

## 2019-05-28 DIAGNOSIS — G8194 Hemiplegia, unspecified affecting left nondominant side: Secondary | ICD-10-CM | POA: Diagnosis present

## 2019-05-28 DIAGNOSIS — Z881 Allergy status to other antibiotic agents status: Secondary | ICD-10-CM | POA: Diagnosis not present

## 2019-05-28 DIAGNOSIS — Z66 Do not resuscitate: Secondary | ICD-10-CM | POA: Diagnosis present

## 2019-05-28 DIAGNOSIS — I618 Other nontraumatic intracerebral hemorrhage: Secondary | ICD-10-CM | POA: Diagnosis not present

## 2019-05-28 DIAGNOSIS — J9600 Acute respiratory failure, unspecified whether with hypoxia or hypercapnia: Secondary | ICD-10-CM | POA: Diagnosis not present

## 2019-05-28 DIAGNOSIS — I606 Nontraumatic subarachnoid hemorrhage from other intracranial arteries: Secondary | ICD-10-CM | POA: Diagnosis not present

## 2019-05-28 DIAGNOSIS — R4182 Altered mental status, unspecified: Secondary | ICD-10-CM | POA: Diagnosis not present

## 2019-05-28 DIAGNOSIS — J811 Chronic pulmonary edema: Secondary | ICD-10-CM | POA: Diagnosis not present

## 2019-05-28 DIAGNOSIS — J90 Pleural effusion, not elsewhere classified: Secondary | ICD-10-CM | POA: Diagnosis not present

## 2019-05-28 DIAGNOSIS — R739 Hyperglycemia, unspecified: Secondary | ICD-10-CM | POA: Diagnosis present

## 2019-05-28 DIAGNOSIS — Z743 Need for continuous supervision: Secondary | ICD-10-CM | POA: Diagnosis not present

## 2019-05-28 DIAGNOSIS — Z20828 Contact with and (suspected) exposure to other viral communicable diseases: Secondary | ICD-10-CM | POA: Diagnosis not present

## 2019-05-28 DIAGNOSIS — R479 Unspecified speech disturbances: Secondary | ICD-10-CM | POA: Diagnosis not present

## 2019-05-28 DIAGNOSIS — Z4889 Encounter for other specified surgical aftercare: Secondary | ICD-10-CM | POA: Diagnosis not present

## 2019-05-28 DIAGNOSIS — I611 Nontraumatic intracerebral hemorrhage in hemisphere, cortical: Secondary | ICD-10-CM | POA: Diagnosis not present

## 2019-05-28 DIAGNOSIS — Z950 Presence of cardiac pacemaker: Secondary | ICD-10-CM | POA: Diagnosis not present

## 2019-05-28 DIAGNOSIS — Z882 Allergy status to sulfonamides status: Secondary | ICD-10-CM | POA: Diagnosis not present

## 2019-05-28 DIAGNOSIS — Z781 Physical restraint status: Secondary | ICD-10-CM | POA: Diagnosis not present

## 2019-05-28 DIAGNOSIS — E872 Acidosis: Secondary | ICD-10-CM | POA: Diagnosis present

## 2019-05-28 DIAGNOSIS — I612 Nontraumatic intracerebral hemorrhage in hemisphere, unspecified: Secondary | ICD-10-CM | POA: Diagnosis not present

## 2019-05-28 DIAGNOSIS — D6832 Hemorrhagic disorder due to extrinsic circulating anticoagulants: Secondary | ICD-10-CM | POA: Diagnosis present

## 2019-05-28 DIAGNOSIS — Z9911 Dependence on respirator [ventilator] status: Secondary | ICD-10-CM | POA: Diagnosis not present

## 2019-05-28 DIAGNOSIS — I871 Compression of vein: Secondary | ICD-10-CM | POA: Diagnosis not present

## 2019-05-28 DIAGNOSIS — R471 Dysarthria and anarthria: Secondary | ICD-10-CM | POA: Diagnosis present

## 2019-05-28 DIAGNOSIS — R410 Disorientation, unspecified: Secondary | ICD-10-CM | POA: Diagnosis not present

## 2019-05-28 DIAGNOSIS — Z515 Encounter for palliative care: Secondary | ICD-10-CM | POA: Diagnosis present

## 2019-05-28 DIAGNOSIS — G936 Cerebral edema: Secondary | ICD-10-CM | POA: Diagnosis not present

## 2019-05-28 DIAGNOSIS — T45515A Adverse effect of anticoagulants, initial encounter: Secondary | ICD-10-CM | POA: Diagnosis present

## 2019-05-28 DIAGNOSIS — R29728 NIHSS score 28: Secondary | ICD-10-CM | POA: Diagnosis not present

## 2019-05-28 DIAGNOSIS — R9431 Abnormal electrocardiogram [ECG] [EKG]: Secondary | ICD-10-CM | POA: Diagnosis not present

## 2019-05-28 DIAGNOSIS — R402214 Coma scale, best verbal response, none, 24 hours or more after hospital admission: Secondary | ICD-10-CM | POA: Diagnosis not present

## 2019-05-28 DIAGNOSIS — I499 Cardiac arrhythmia, unspecified: Secondary | ICD-10-CM | POA: Diagnosis not present

## 2019-05-28 DIAGNOSIS — I609 Nontraumatic subarachnoid hemorrhage, unspecified: Secondary | ICD-10-CM | POA: Diagnosis not present

## 2019-05-28 DIAGNOSIS — Z880 Allergy status to penicillin: Secondary | ICD-10-CM | POA: Diagnosis not present

## 2019-05-28 DIAGNOSIS — G935 Compression of brain: Secondary | ICD-10-CM | POA: Diagnosis not present

## 2019-05-28 DIAGNOSIS — E785 Hyperlipidemia, unspecified: Secondary | ICD-10-CM | POA: Diagnosis present

## 2019-05-28 DIAGNOSIS — G8100 Flaccid hemiplegia affecting unspecified side: Secondary | ICD-10-CM | POA: Diagnosis not present

## 2019-05-28 DIAGNOSIS — I619 Nontraumatic intracerebral hemorrhage, unspecified: Secondary | ICD-10-CM | POA: Diagnosis not present

## 2019-05-28 DIAGNOSIS — R000 Tachycardia, unspecified: Secondary | ICD-10-CM | POA: Diagnosis not present

## 2019-05-28 DIAGNOSIS — Z1159 Encounter for screening for other viral diseases: Secondary | ICD-10-CM | POA: Diagnosis not present

## 2019-05-28 DIAGNOSIS — I615 Nontraumatic intracerebral hemorrhage, intraventricular: Secondary | ICD-10-CM | POA: Diagnosis present

## 2019-05-28 DIAGNOSIS — R402314 Coma scale, best motor response, none, 24 hours or more after hospital admission: Secondary | ICD-10-CM | POA: Diagnosis not present

## 2019-06-02 ENCOUNTER — Encounter: Payer: Self-pay | Admitting: Cardiology

## 2019-06-02 NOTE — Progress Notes (Signed)
Remote pacemaker transmission.   

## 2019-06-22 DEATH — deceased

## 2019-08-26 ENCOUNTER — Encounter: Payer: Medicare Other | Admitting: *Deleted
